# Patient Record
Sex: Female | Born: 1950
Health system: Southern US, Community
[De-identification: ages and names within clinical notes are randomized; demographics above are authoritative.]

## PROBLEM LIST (undated history)

## (undated) DIAGNOSIS — Z9889 Other specified postprocedural states: Secondary | ICD-10-CM

## (undated) DIAGNOSIS — M1990 Unspecified osteoarthritis, unspecified site: Secondary | ICD-10-CM

## (undated) DIAGNOSIS — G5600 Carpal tunnel syndrome, unspecified upper limb: Secondary | ICD-10-CM

## (undated) DIAGNOSIS — C439 Malignant melanoma of skin, unspecified: Secondary | ICD-10-CM

## (undated) DIAGNOSIS — J449 Chronic obstructive pulmonary disease, unspecified: Secondary | ICD-10-CM

## (undated) DIAGNOSIS — J189 Pneumonia, unspecified organism: Secondary | ICD-10-CM

## (undated) DIAGNOSIS — N1330 Unspecified hydronephrosis: Secondary | ICD-10-CM

## (undated) DIAGNOSIS — R0600 Dyspnea, unspecified: Secondary | ICD-10-CM

## (undated) DIAGNOSIS — R112 Nausea with vomiting, unspecified: Secondary | ICD-10-CM

## (undated) HISTORY — PX: SKIN CANCER EXCISION: SHX779

## (undated) HISTORY — PX: ABDOMINAL HYSTERECTOMY: SHX81

---
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
## 2001-05-30 ENCOUNTER — Encounter: Payer: Self-pay | Admitting: Family Medicine

## 2001-05-30 ENCOUNTER — Ambulatory Visit (HOSPITAL_COMMUNITY): Admission: RE | Admit: 2001-05-30 | Discharge: 2001-05-30 | Payer: Self-pay | Admitting: Family Medicine

## 2002-11-23 ENCOUNTER — Encounter: Payer: Self-pay | Admitting: *Deleted

## 2002-11-23 ENCOUNTER — Emergency Department (HOSPITAL_COMMUNITY): Admission: EM | Admit: 2002-11-23 | Discharge: 2002-11-24 | Payer: Self-pay | Admitting: *Deleted

## 2003-02-01 ENCOUNTER — Ambulatory Visit (HOSPITAL_COMMUNITY): Admission: RE | Admit: 2003-02-01 | Discharge: 2003-02-01 | Payer: Self-pay | Admitting: Family Medicine

## 2005-01-16 ENCOUNTER — Ambulatory Visit (HOSPITAL_COMMUNITY): Admission: RE | Admit: 2005-01-16 | Discharge: 2005-01-16 | Payer: Self-pay | Admitting: Family Medicine

## 2006-03-03 ENCOUNTER — Ambulatory Visit (HOSPITAL_COMMUNITY): Admission: RE | Admit: 2006-03-03 | Discharge: 2006-03-03 | Payer: Self-pay | Admitting: Family Medicine

## 2006-07-16 ENCOUNTER — Emergency Department (HOSPITAL_COMMUNITY): Admission: EM | Admit: 2006-07-16 | Discharge: 2006-07-16 | Payer: Self-pay | Admitting: Emergency Medicine

## 2007-03-07 ENCOUNTER — Ambulatory Visit (HOSPITAL_COMMUNITY): Admission: RE | Admit: 2007-03-07 | Discharge: 2007-03-07 | Payer: Self-pay | Admitting: Family Medicine

## 2008-01-04 ENCOUNTER — Inpatient Hospital Stay (HOSPITAL_COMMUNITY): Admission: EM | Admit: 2008-01-04 | Discharge: 2008-01-07 | Payer: Self-pay | Admitting: Emergency Medicine

## 2010-06-24 NOTE — Discharge Summary (Signed)
[name], Karen Church               ACCOUNT NO.:  [id number]   MEDICAL RECORD NO.:  [id number]          PATIENT TYPE:  INP   LOCATION:  A335                          FACILITY:  [facility]   PHYSICIAN:  Skeet Latch, DO    DATE OF BIRTH:  [birth year]   DATE OF ADMISSION:  01/04/2008  DATE OF DISCHARGE:  11/28/2009LH                               DISCHARGE SUMMARY   PRIMARY CARE PHYSICIAN:  Patient goes to the Health Department.   DISCHARGE DIAGNOSES:  1. Right lower lobe pneumonia.  2. Acute exacerbation of chronic obstructive pulmonary disease.  3. History of tobacco abuse.  4. Tachycardia.   BRIEF HOSPITAL COURSE:  This is a 60 year old Caucasian female who  presented with a complaint of shortness of breath.  The patient states  that for approximately 2 weeks she has been getting more shortness of  breath and having trouble breathing.  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  She states that she has tried to use her  nebulizer, which she had at home more often over the last 2 weeks and  also she started to develop a productive hacking cough.  Her breathing  worsened over the last few days.  She had no chills, fever, nausea, or  vomiting.  The patient states that she was told that she might have  emphysema that is why she had a nebulizer at home.  The patient  presented with above symptoms.  EKG showed sinus tachycardia, normal  axis, normal PRQ, normal QRS, normal ST interval, no Q waves.  Her chest  x-ray showed an ill-defined density present over the right lung base,  which looks like early __________ pneumonia.  D-dimer was 0.38, sodium  137, potassium 4.1, chloride 104, CO2 of 29, glucose 108, BUN 19,  creatinine 0.86, __________ unremarkable, showed a white count 14,800,  hemoglobin 15.3, hematocrit 45.6, and platelet count 269.  She was  admitted for right lower lobe pneumonia.  She was started on IV  antibiotics and nebulizing treatments with IV steroids.  The  patient was  maintained on oxygen to keep her sats above or greater than 92%.  The  patient was maintained on IV fluid.  The patient was also placed on  sliding scale for her blood sugars.  The patient was also placed on  Nicotine patch secondary to her tobacco abuse.  I think her breathing  has markedly improved during the hospital stay.  The patient also  complains of some right hip pain the x-rays showed no definite  explanation identified for the patient's right hip pain.  No evidence of  acute bony abnormalities.  Showed very mild degenerative changes in both  hip, which are symmetric.  At this time, I feel the patient is stable  enough to be discharged home for a close follow with the health  department.   MEDICATIONS ON DISCHARGE:  1. The patient continue her nebulizing treatment that she takes at      home.  I believe this is albuterol.  The patient will be placed on  Advair 250/50 mcg one inhalation twice a day.  2. Mucinex 600 mg twice a day.  3. Tessalon Perles 100 mg 3 times a day as needed.  4. Levaquin 750 mg p.o. daily for 7 days.  5. Prednisone 30 mg for 2 days, 15 mg for 2 days, 7.5 mg for days, and      5 mg for 2 days then stop.   VITALS ON DISCHARGE:  Temperature 97.6, pulse 83, respiration 20, blood  pressure 143/74, and satting 91% on room air.   LABORATORY DATA:  White count 11.8, hemoglobin 11.9, hematocrit 35.0,  and platelet count 200,000.   CONDITION ON DISCHARGE:  Stable.   DISPOSITION:  The patient will be discharged to home.   DISCHARGE INSTRUCTIONS:  The patient to maintain a regular diet.  She is  to increase her activity as tolerated.  The patient is to follow up with  the health department in the next 3-5 days.  The patient will take her  medications as directed.  I had long talk with the patient regarding her  emphysema and the need to stop smoking.  The patient was transferred to  the emergency room.  She has an increasing shortness of  breath or any  similar symptoms.      Skeet Latch, DO  Electronically Signed     SM/MEDQ  D:  01/07/2008  T:  01/08/2008  Job:  [id number]

## 2010-06-24 NOTE — H&P (Signed)
Karen Church, Karen Church               ACCOUNT NO.:  [id number]   MEDICAL RECORD NO.:  [id number]          PATIENT TYPE:  INP   LOCATION:  A335                          FACILITY:  [facility]   PHYSICIAN:  Dorris Singh, DO    DATE OF BIRTH:  09/13/1950   DATE OF ADMISSION:  01/04/2008  DATE OF DISCHARGE:  LH                              HISTORY & PHYSICAL   The patient is a 60 year old Caucasian female who presented to the [facility] emergency room with the chief complaint of shortness of breath.  The patient recounts a history of 2 weeks ago working.  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  Started using her nebulizer.  Over the last couple of weeks, she noticed  that she developed a productive and hacking cough, and her breathing has  worsened over the last couple days.  She denies any fever, chills,  nausea and vomiting.  She does have a history of COPD for which she does  use a nebulizer.  Her room air saturations upon admission to the  emergency room are 93%.  When she was placed on 2 liters, it came up to  100.  She said that it was aggravated by exertion.  It was relieved by  nothing.  She is a patient at the [facility].   Her past medical history is significant for COPD.   SOCIAL HISTORY:  She is married, and she smokes at least a pack a day.  Currently is a Visual merchandiser.   ALLERGIES:  No known drug allergies.   MEDICATIONS:  She does use medications for the nebulizers but does not  know which one.   REVIEW OF SYSTEMS:  CONSTITUTIONALLY:  Negative for fever and chills.  EYES:  Negative for changes in vision.  EARS, NOSE, MOUTH AND THROAT:  All negative.  CARDIOVASCULAR:  Positive for shortness of breath on  exertion and at rest and tachycardia.  RESPIRATORY:  Positive for cough  and dyspnea.  GASTROINTESTINAL:  Negative.  GENITOURINARY:  Negative.  MUSCULOSKELETAL:  Negative.  SKIN:  Negative.  NEUROLOGICAL:   Negative.  PSYCHIATRIC:  Negative.  METABOLIC:  Negative.  HEMATOLOGIC:  Negative.   PHYSICAL EXAMINATION:  CURRENT VITALS:  Are as follows:  Temperature  98.8, pulse 87, respirations 20, blood pressure 133/75.  GENERAL:  The patient is a well-developed, well-nourished, Caucasian  female who looked older than her stated age.  HEAD:  Is normocephalic, atraumatic.  Eyes are PERRL.  EOMI.  No scleral  icterus or conjunctival injection.  Ears, nose, mouth are within normal  limits.  Teeth are in poor repair.  NECK:  Is supple, nontender, no lymphadenopathy.  CARDIOVASCULAR:  Currently is regular rate and rhythm.  No murmur, rubs  or gallops noted.  RESPIRATORY:  Positive wheezing on inspiration and expiration and  prolonged expirations  ABDOMEN:  Soft, nontender, nondistended.  Bowel sounds in all 4  quadrants.  No organomegaly noted.  EXTREMITIES:  Positive pulses.  No ecchymosis, edema or cyanosis.  NEUROLOGICAL:  Cranial nerves II-XII grossly  intact.  Good sensation.  A/O x3 and appropriate.  SKIN:  Good turgor, good texture but is dry.   Her EKG that was done showed sinus tachycardia with normal axis and  normal PQRS and ST interval.  Her chest x-ray with the 1 view shows ill-  defined density projecting over right lung base, potentially looks like  an early bronchopneumonia, otherwise negative.  Her BNP was less than  30.  Her D-dimer was 0.38.  Sodium was 139, potassium 4.1, chloride 104,  carbon dioxide 29, glucose 108, BUN 90, creatinine 0.86.  First set of  cardiac markers were negative.  CBC:  White count of 14.8, hemoglobin  15.3, hematocrit 45.6, platelet count of 269.   ASSESSMENT AND PLAN:  1. Right lower lobe pneumonia.  2. Chronic obstructive pulmonary disease.  3. Tobacco abuse.  4. Tachycardia.   Plan will be to admit the patient to the service of [facility] on  telemetry.  Will start using any type of respiratory therapy to keep her  saturations above 92%.  Will  give her a liter bolus to see if her  tachycardia will resolve, and it has since that time.  Also will get a  repeat chest x-ray on November 27 to see if she is resolving from that.  Will do DVT and GI prophylaxis.  Will place her on Solu-Medrol as well  as giving her an initial dose in combination with the emergency room of  125.  Will also monitor her blood sugars and have sliding scale protocol  for her hypokalemia.  Will put her on Rocephin 1 gram every 24 hours and  Zithromax 500 IV every 24.  Will give her a Nicoderm patch as well.  Will continue to monitor her and change therapy as necessary.      Dorris Singh, DO  Electronically Signed     CB/MEDQ  D:  01/05/2008  T:  01/05/2008  Job:  [id number]

## 2010-11-11 LAB — BASIC METABOLIC PANEL
CO2: 26
CO2: 26
Calcium: 9
Calcium: 9
Calcium: 9.9
Chloride: 113 — ABNORMAL HIGH
Creatinine, Ser: 0.79
Creatinine, Ser: 0.86
GFR calc Af Amer: >60
GFR calc Af Amer: >60
GFR calc Af Amer: >60
GFR calc non Af Amer: >60
GFR calc non Af Amer: >60
Glucose, Bld: 164 — ABNORMAL HIGH
Potassium: 4.5
Sodium: 139
Sodium: 143

## 2010-11-11 LAB — DIFFERENTIAL
Basophils Absolute: 0
Basophils Relative: 0
Basophils Relative: 0
Basophils Relative: 0
Eosinophils Relative: 0
Lymphocytes Relative: 11 — ABNORMAL LOW
Lymphocytes Relative: 5 — ABNORMAL LOW
Lymphs Abs: 1.1
Monocytes Absolute: 0.3
Monocytes Absolute: 0.6
Monocytes Relative: 2 — ABNORMAL LOW
Monocytes Relative: 2 — ABNORMAL LOW
Monocytes Relative: 4
Neutro Abs: 10.4 — ABNORMAL HIGH
Neutro Abs: 12.2 — ABNORMAL HIGH
Neutro Abs: 12.3 — ABNORMAL HIGH
Neutro Abs: 14.4 — ABNORMAL HIGH
Neutrophils Relative %: 84 — ABNORMAL HIGH
Neutrophils Relative %: 88 — ABNORMAL HIGH
Neutrophils Relative %: 95 — ABNORMAL HIGH

## 2010-11-11 LAB — POCT CARDIAC MARKERS
Myoglobin, poc: 125
Troponin i, poc: <0.05

## 2010-11-11 LAB — CBC
HCT: 36.4
Hemoglobin: 12.2
Hemoglobin: 15.3 — ABNORMAL HIGH
MCHC: 33.6
MCHC: 33.6
MCHC: 34.5
RBC: 3.68 — ABNORMAL LOW
RBC: 3.84 — ABNORMAL LOW
RBC: 4.84
RDW: 13.1
WBC: 11.8 — ABNORMAL HIGH
WBC: 14.8 — ABNORMAL HIGH

## 2010-11-11 LAB — GLUCOSE, CAPILLARY
Glucose-Capillary: 104 — ABNORMAL HIGH
Glucose-Capillary: 125 — ABNORMAL HIGH
Glucose-Capillary: 158 — ABNORMAL HIGH
Glucose-Capillary: 166 — ABNORMAL HIGH
Glucose-Capillary: 168 — ABNORMAL HIGH
Glucose-Capillary: 207 — ABNORMAL HIGH

## 2010-11-11 LAB — CULTURE, BLOOD (ROUTINE X 2)
Culture: NO GROWTH
Report Status: 11302009
Report Status: 11302009

## 2010-11-11 LAB — APTT: aPTT: 41 — ABNORMAL HIGH

## 2010-11-11 LAB — D-DIMER, QUANTITATIVE: D-Dimer, Quant: 0.38

## 2010-11-11 LAB — PROTIME-INR
INR: 1.1
Prothrombin Time: 14.4

## 2011-11-05 ENCOUNTER — Other Ambulatory Visit (HOSPITAL_COMMUNITY): Payer: Self-pay | Admitting: Nurse Practitioner

## 2011-11-05 DIAGNOSIS — Z139 Encounter for screening, unspecified: Secondary | ICD-10-CM

## 2011-11-10 ENCOUNTER — Ambulatory Visit (HOSPITAL_COMMUNITY)
Admission: RE | Admit: 2011-11-10 | Discharge: 2011-11-10 | Disposition: A | Discharge status: Home / Self Care | Payer: PRIVATE HEALTH INSURANCE | Source: Ambulatory Visit | Attending: Nurse Practitioner | Admitting: Nurse Practitioner

## 2011-11-10 DIAGNOSIS — Z1231 Encounter for screening mammogram for malignant neoplasm of breast: Secondary | ICD-10-CM | POA: Insufficient documentation

## 2011-11-10 DIAGNOSIS — Z139 Encounter for screening, unspecified: Secondary | ICD-10-CM

## 2011-11-14 ENCOUNTER — Emergency Department (HOSPITAL_COMMUNITY): Payer: No Typology Code available for payment source

## 2011-11-14 ENCOUNTER — Encounter (HOSPITAL_COMMUNITY): Payer: Self-pay

## 2011-11-14 ENCOUNTER — Emergency Department (HOSPITAL_COMMUNITY)
Admission: EM | Admit: 2011-11-14 | Discharge: 2011-11-14 | Disposition: A | Discharge status: Home / Self Care | Payer: No Typology Code available for payment source | Attending: Emergency Medicine | Admitting: Emergency Medicine

## 2011-11-14 DIAGNOSIS — R079 Chest pain, unspecified: Secondary | ICD-10-CM | POA: Insufficient documentation

## 2011-11-14 DIAGNOSIS — M542 Cervicalgia: Secondary | ICD-10-CM | POA: Insufficient documentation

## 2011-11-14 DIAGNOSIS — S8011XA Contusion of right lower leg, initial encounter: Secondary | ICD-10-CM

## 2011-11-14 DIAGNOSIS — T1490XA Injury, unspecified, initial encounter: Secondary | ICD-10-CM | POA: Insufficient documentation

## 2011-11-14 DIAGNOSIS — R51 Headache: Secondary | ICD-10-CM | POA: Insufficient documentation

## 2011-11-14 DIAGNOSIS — M549 Dorsalgia, unspecified: Secondary | ICD-10-CM

## 2011-11-14 DIAGNOSIS — M79609 Pain in unspecified limb: Secondary | ICD-10-CM | POA: Insufficient documentation

## 2011-11-14 DIAGNOSIS — S8012XA Contusion of left lower leg, initial encounter: Secondary | ICD-10-CM

## 2011-11-14 DIAGNOSIS — S8010XA Contusion of unspecified lower leg, initial encounter: Secondary | ICD-10-CM | POA: Insufficient documentation

## 2011-11-14 HISTORY — DX: Carpal tunnel syndrome, unspecified upper limb: G56.00

## 2011-11-14 HISTORY — DX: Chronic obstructive pulmonary disease, unspecified: J44.9

## 2011-11-14 LAB — BASIC METABOLIC PANEL
BUN: 12 mg/dL (ref 6–23)
Chloride: 104 mEq/L (ref 96–112)
GFR calc Af Amer: 83 mL/min — ABNORMAL LOW (ref >90)
Potassium: 3.9 mEq/L (ref 3.5–5.1)

## 2011-11-14 LAB — CBC WITH DIFFERENTIAL/PLATELET
Basophils Relative: 0 % (ref 0–1)
Hemoglobin: 14.5 g/dL (ref 12.0–15.0)
Lymphs Abs: 1.8 10*3/uL (ref 0.7–4.0)
MCHC: 34.9 g/dL (ref 30.0–36.0)
Monocytes Relative: 4 % (ref 3–12)
Neutro Abs: 7 10*3/uL (ref 1.7–7.7)
Neutrophils Relative %: 75 % (ref 43–77)
RBC: 4.41 MIL/uL (ref 3.87–5.11)
WBC: 9.3 10*3/uL (ref 4.0–10.5)

## 2011-11-14 MED ORDER — IOHEXOL 300 MG/ML  SOLN
80.0000 mL | Freq: Once | INTRAMUSCULAR | Status: AC | PRN
  Administered 2011-11-14: 80 mL via INTRAVENOUS

## 2011-11-14 MED ORDER — HYDROCODONE-ACETAMINOPHEN 5-325 MG PO TABS: 1.0000 | ORAL_TABLET | Freq: Four times a day (QID) | ORAL | Status: DC | PRN
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
## 2011-11-14 NOTE — ED Notes (Signed)
Called to patient room by family member. Patient with continued pain to bilateral scapular area. Patient stating she does not want any pain medications at this time. Patient informed to contact RN if she needs anything.

## 2011-11-14 NOTE — ED Notes (Signed)
Patient with no complaints at this time. Respirations even and unlabored. Skin warm/dry. Discharge instructions reviewed with patient at this time. Patient given opportunity to voice concerns/ask questions. IV removed per policy and band-aid applied to site. Patient discharged at this time and left Emergency Department via wheelchair.  
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
## 2011-11-14 NOTE — ED Notes (Signed)
Patient immobilized by EMS in sitting position. C-collar in place. Patient alert/oriented to baseline x4.

## 2012-11-29 ENCOUNTER — Other Ambulatory Visit (HOSPITAL_COMMUNITY): Payer: Self-pay | Admitting: *Deleted

## 2012-11-29 DIAGNOSIS — Z139 Encounter for screening, unspecified: Secondary | ICD-10-CM

## 2012-12-08 ENCOUNTER — Ambulatory Visit (HOSPITAL_COMMUNITY)
Admission: RE | Admit: 2012-12-08 | Discharge: 2012-12-08 | Disposition: A | Discharge status: Home / Self Care | Payer: PRIVATE HEALTH INSURANCE | Source: Ambulatory Visit | Attending: *Deleted | Admitting: *Deleted

## 2012-12-08 DIAGNOSIS — Z1231 Encounter for screening mammogram for malignant neoplasm of breast: Secondary | ICD-10-CM | POA: Insufficient documentation

## 2012-12-08 DIAGNOSIS — Z139 Encounter for screening, unspecified: Secondary | ICD-10-CM

## 2014-10-30 ENCOUNTER — Encounter (HOSPITAL_COMMUNITY): Payer: Self-pay

## 2014-10-30 ENCOUNTER — Emergency Department (HOSPITAL_COMMUNITY)
Admission: EM | Admit: 2014-10-30 | Discharge: 2014-10-30 | Disposition: A | Discharge status: Home / Self Care | Payer: Medicare Other | Attending: Emergency Medicine | Admitting: Emergency Medicine

## 2014-10-30 ENCOUNTER — Emergency Department (HOSPITAL_COMMUNITY): Payer: Medicare Other

## 2014-10-30 DIAGNOSIS — J189 Pneumonia, unspecified organism: Secondary | ICD-10-CM

## 2014-10-30 DIAGNOSIS — R0602 Shortness of breath: Secondary | ICD-10-CM | POA: Diagnosis present

## 2014-10-30 DIAGNOSIS — Z720 Tobacco use: Secondary | ICD-10-CM | POA: Diagnosis not present

## 2014-10-30 DIAGNOSIS — J9801 Acute bronchospasm: Secondary | ICD-10-CM | POA: Diagnosis not present

## 2014-10-30 DIAGNOSIS — Z7982 Long term (current) use of aspirin: Secondary | ICD-10-CM | POA: Insufficient documentation

## 2014-10-30 DIAGNOSIS — Z8669 Personal history of other diseases of the nervous system and sense organs: Secondary | ICD-10-CM | POA: Diagnosis not present

## 2014-10-30 DIAGNOSIS — J159 Unspecified bacterial pneumonia: Secondary | ICD-10-CM | POA: Insufficient documentation

## 2014-10-30 DIAGNOSIS — Z79899 Other long term (current) drug therapy: Secondary | ICD-10-CM | POA: Diagnosis not present

## 2014-10-30 DIAGNOSIS — J441 Chronic obstructive pulmonary disease with (acute) exacerbation: Secondary | ICD-10-CM | POA: Diagnosis not present

## 2014-10-30 LAB — CBC WITH DIFFERENTIAL/PLATELET
Basophils Absolute: 0 10*3/uL (ref 0.0–0.1)
Basophils Relative: 0 %
EOS ABS: 0.2 10*3/uL (ref 0.0–0.7)
EOS PCT: 2 %
HCT: 40.9 % (ref 36.0–46.0)
Hemoglobin: 13.8 g/dL (ref 12.0–15.0)
LYMPHS ABS: 1.2 10*3/uL (ref 0.7–4.0)
LYMPHS PCT: 12 %
MCH: 32.9 pg (ref 26.0–34.0)
MCHC: 33.7 g/dL (ref 30.0–36.0)
MCV: 97.4 fL (ref 78.0–100.0)
MONO ABS: 0.7 10*3/uL (ref 0.1–1.0)
Monocytes Relative: 7 %
Neutro Abs: 8.4 10*3/uL — ABNORMAL HIGH (ref 1.7–7.7)
Neutrophils Relative %: 79 %
PLATELETS: 208 10*3/uL (ref 150–400)
RBC: 4.2 MIL/uL (ref 3.87–5.11)
RDW: 12.6 % (ref 11.5–15.5)
WBC: 10.6 10*3/uL — AB (ref 4.0–10.5)

## 2014-10-30 LAB — BASIC METABOLIC PANEL
Anion gap: 7 (ref 5–15)
BUN: 14 mg/dL (ref 6–20)
CHLORIDE: 99 mmol/L — AB (ref 101–111)
CO2: 35 mmol/L — AB (ref 22–32)
Calcium: 9.4 mg/dL (ref 8.9–10.3)
Creatinine, Ser: 0.87 mg/dL (ref 0.44–1.00)
GFR calc Af Amer: >60 mL/min (ref >60)
GLUCOSE: 112 mg/dL — AB (ref 65–99)
POTASSIUM: 3.8 mmol/L (ref 3.5–5.1)
Sodium: 141 mmol/L (ref 135–145)

## 2014-10-30 MED ORDER — ALBUTEROL SULFATE (2.5 MG/3ML) 0.083% IN NEBU
10.0000 mg | INHALATION_SOLUTION | Freq: Once | RESPIRATORY_TRACT | Status: AC
  Administered 2014-10-30: 10 mg via RESPIRATORY_TRACT
  Filled 2014-10-30: qty 12

## 2014-10-30 MED ORDER — LEVOFLOXACIN 750 MG PO TABS: 750.0000 mg | ORAL_TABLET | Freq: Every day | ORAL | Status: DC

## 2014-10-30 MED ORDER — LEVOFLOXACIN 750 MG PO TABS
750.0000 mg | ORAL_TABLET | Freq: Once | ORAL | Status: AC
  Administered 2014-10-30: 750 mg via ORAL
  Filled 2014-10-30: qty 1

## 2014-10-30 MED ORDER — PREDNISONE 50 MG PO TABS
60.0000 mg | ORAL_TABLET | Freq: Once | ORAL | Status: AC
  Administered 2014-10-30: 60 mg via ORAL
  Filled 2014-10-30 (×2): qty 1

## 2014-10-30 MED ORDER — PREDNISONE 20 MG PO TABS: 20.0000 mg | ORAL_TABLET | Freq: Two times a day (BID) | ORAL | Status: DC

## 2014-10-30 NOTE — ED Provider Notes (Signed)
CSN: [id number]     Arrival date & time 10/30/14  1919 History  This chart was scribed for Mancel Bale, MD by Evon Slack, ED Scribe. This patient was seen in room APA15/APA15 and the patient's care was started at 8:05 PM.    Chief Complaint  Patient presents with  . Shortness of Breath   Patient is a 64 y.o. female presenting with shortness of breath. The history is provided by the patient. No language interpreter was used.  Shortness of Breath Associated symptoms: cough   Associated symptoms: no fever    HPI Comments: Karen Church is a 64 y.o. female who presents to the Emergency Department complaining of progressively worsening SOB onset 5 days prior. Pt states that her symptoms started with congestion. Pt reports productive cough or dark colored sputum. Pt states that she has been using her at home nebulizer with no relief. Pt states that her SOB is worse when laying down. Pt denies fever or other related symptoms.   Pt still reports tobacco use daily  There are no other known modifying factors.   Past Medical History  Diagnosis Date  . COPD (chronic obstructive pulmonary disease)   . Carpal tunnel syndrome    Past Surgical History  Procedure Laterality Date  . Abdominal surgery    . Cesarean section    . Skin cancer excision    . Abdominal hysterectomy     No family history on file. Social History  Substance Use Topics  . Smoking status: Current Every Day Smoker -- 0.50 packs/day    Types: Cigarettes  . Smokeless tobacco: None  . Alcohol Use: No   OB History    No data available     Review of Systems  Constitutional: Negative for fever.  HENT: Positive for congestion.   Respiratory: Positive for cough and shortness of breath.   All other systems reviewed and are negative.    Allergies  Review of patient's allergies indicates no known allergies.  Home Medications   Prior to Admission medications   Medication Sig Start Date End Date Taking?  Authorizing Provider  aspirin EC 81 MG tablet Take 81 mg by mouth every other day.    Historical Provider, MD  fish oil-omega-3 fatty acids 1000 MG capsule Take 1 g by mouth daily.    Historical Provider, MD  levofloxacin (LEVAQUIN) 750 MG tablet Take 1 tablet (750 mg total) by mouth daily. 10/30/14   Mancel Bale, MD  Multiple Vitamin (MULTIVITAMIN WITH MINERALS) TABS Take 1 tablet by mouth daily.    Historical Provider, MD  predniSONE (DELTASONE) 20 MG tablet Take 1 tablet (20 mg total) by mouth 2 (two) times daily. 10/30/14   Mancel Bale, MD   BP 134/57 mmHg  Pulse 70  Temp(Src) 99.3 F (37.4 C) (Oral)  Resp 16  Ht  (1.6 m)  Wt 120 lb (54.432 kg)  BMI 21.26 kg/m2  SpO2 93%   Physical Exam  Constitutional: She is oriented to person, place, and time. She appears well-developed and well-nourished.  HENT:  Head: Normocephalic and atraumatic.  Right Ear: External ear normal.  Left Ear: External ear normal.  Eyes: Conjunctivae and EOM are normal. Pupils are equal, round, and reactive to light.  Neck: Normal range of motion and phonation normal. Neck supple.  Cardiovascular: Normal rate, regular rhythm and normal heart sounds.   Pulmonary/Chest: Effort normal. She has decreased breath sounds. She has wheezes. She exhibits no bony tenderness.  Bilateral decreased air movement  with generalized wheezing  Abdominal: Soft. There is no tenderness.  Musculoskeletal: Normal range of motion.  Neurological: She is alert and oriented to person, place, and time. No cranial nerve deficit or sensory deficit. She exhibits normal muscle tone. Coordination normal.  Skin: Skin is warm, dry and intact.  Psychiatric: She has a normal mood and affect. Her behavior is normal. Judgment and thought content normal.  Nursing note and vitals reviewed.   ED Course  Procedures (including critical care time)   Medications  albuterol (PROVENTIL) (2.5 MG/3ML) 0.083% nebulizer solution 10 mg (10 mg  Nebulization Given 10/30/14 2048)  predniSONE (DELTASONE) tablet 60 mg (60 mg Oral Given 10/30/14 2026)  levofloxacin (LEVAQUIN) tablet 750 mg (750 mg Oral Given 10/30/14 2026)    Patient Vitals for the past 24 hrs:  BP Temp Temp src Pulse Resp SpO2 Height Weight  10/30/14 2234 134/57 mmHg 99.3 F (37.4 C) Oral 70 16 93 % - -  10/30/14 2055 - - - - - 91 % - -  10/30/14 1931 (!) 135/109 mmHg 99 F (37.2 C) Oral 68 16 91 %  (1.6 m) 120 lb (54.432 kg)    At discharge-  Reevaluation with update and discussion. After initial assessment and treatment, an updated evaluation reveals patient is comfortable and has improved air movement. Findings discussed with patient and family member, all questions were answered. WENTZ,ELLIOTT L     DIAGNOSTIC STUDIES: Oxygen Saturation is 91% on RA, low by my interpretation.    COORDINATION OF CARE: 8:15 PM-Discussed treatment plan with pt at bedside and pt agreed to plan.     Labs Review Labs Reviewed  BASIC METABOLIC PANEL - Abnormal; Notable for the following:    Chloride 99 (*)    CO2 35 (*)    Glucose, Bld 112 (*)    All other components within normal limits  CBC WITH DIFFERENTIAL/PLATELET - Abnormal; Notable for the following:    WBC 10.6 (*)    Neutro Abs 8.4 (*)    All other components within normal limits    Imaging Review Dg Chest 2 View  10/30/2014   CLINICAL DATA:  Pt c/o nonproductive cough since Thursday. When coughing becomes constant Pt experiences severe SOB. Hx COPD, smoker  EXAM: CHEST - 2 VIEW  COMPARISON:  CT 11/14/2011  FINDINGS: Patchy somewhat nodular lingular infiltrate, obscuring the left heart border, new since prior studies. Lungs are otherwise mildly hyperinflated. Heart size remains normal. No pneumothorax. No effusion. Minimal spurring in the lower thoracic spine.  IMPRESSION: 1. Patchy lingular infiltrate suggesting pneumonia. Consider follow-up to confirm appropriate resolution and exclude underlying lesion.    Electronically Signed   By: Corlis Leak M.D.   On: 10/30/2014 21:04      EKG Interpretation None      MDM   Final diagnoses:  CAP (community acquired pneumonia)  Bronchospasm  Tobacco abuse    Community acquired pneumonia without signs and symptoms of sepsis, and associated bronchospasm. Patient improved with treatment and is stable for discharge with outpatient management.  Nursing Notes Reviewed/ Care Coordinated Applicable Imaging Reviewed Interpretation of Laboratory Data incorporated into ED treatment  The patient appears reasonably screened and/or stabilized for discharge and I doubt any other medical condition or other [facility] requiring further screening, evaluation, or treatment in the ED at this time prior to discharge.  Plan: Home Medications- Prednisone, Levaquin; Home Treatments- Stop smoking; return here if the recommended treatment, does not improve the symptoms; Recommended follow up- PCP  1 week  I personally performed the services described in this documentation, which was scribed in my presence. The recorded information has been reviewed and is accurate.      Mancel Bale, MD 10/30/14 [phone]

## 2014-10-30 NOTE — ED Notes (Signed)
Started last week with congestion and tried to get into the health department and they could not see me due to my insurance. I tried to fight it off and now I have became worse. I feel like when I lay down I am smothering per pt.

## 2014-10-30 NOTE — Discharge Instructions (Signed)
Bronchospasm A bronchospasm is a spasm or tightening of the airways going into the lungs. During a bronchospasm breathing becomes more difficult because the airways get smaller. When this happens there can be coughing, a whistling sound when breathing (wheezing), and difficulty breathing. Bronchospasm is often associated with asthma, but not all patients who experience a bronchospasm have asthma. CAUSES  A bronchospasm is caused by inflammation or irritation of the airways. The inflammation or irritation may be triggered by:   Allergies (such as to animals, pollen, food, or mold). Allergens that cause bronchospasm may cause wheezing immediately after exposure or many hours later.   Infection. Viral infections are believed to be the most common cause of bronchospasm.   Exercise.   Irritants (such as pollution, cigarette smoke, strong odors, aerosol sprays, and paint fumes).   Weather changes. Winds increase molds and pollens in the air. Rain refreshes the air by washing irritants out. Cold air may cause inflammation.   Stress and emotional upset.  SIGNS AND SYMPTOMS   Wheezing.   Excessive nighttime coughing.   Frequent or severe coughing with a simple cold.   Chest tightness.   Shortness of breath.  DIAGNOSIS  Bronchospasm is usually diagnosed through a history and physical exam. Tests, such as chest X-rays, are sometimes done to look for other conditions. TREATMENT   Inhaled medicines can be given to open up your airways and help you breathe. The medicines can be given using either an inhaler or a nebulizer machine.  Corticosteroid medicines may be given for severe bronchospasm, usually when it is associated with asthma. HOME CARE INSTRUCTIONS   Always have a plan prepared for seeking medical care. Know when to call your health care provider and local emergency services (911 in the [place] Know where you can access local emergency care.  Only take medicines as  directed by your health care provider.  If you were prescribed an inhaler or nebulizer machine, ask your health care provider to explain how to use it correctly. Always use a spacer with your inhaler if you were given one.  It is necessary to remain calm during an attack. Try to relax and breathe more slowly.  Control your home environment in the following ways:   Change your heating and air conditioning filter at least once a month.   Limit your use of fireplaces and wood stoves.  Do not smoke and do not allow smoking in your home.   Avoid exposure to perfumes and fragrances.   Get rid of pests (such as roaches and mice) and their droppings.   Throw away plants if you see mold on them.   Keep your house clean and dust free.   Replace carpet with wood, tile, or vinyl flooring. Carpet can trap dander and dust.   Use allergy-proof pillows, mattress covers, and box spring covers.   Wash bed sheets and blankets every week in hot water and dry them in a dryer.   Use blankets that are made of polyester or cotton.   Wash hands frequently. SEEK MEDICAL CARE IF:   You have muscle aches.   You have chest pain.   The sputum changes from clear or white to yellow, green, gray, or bloody.   The sputum you cough up gets thicker.   There are problems that may be related to the medicine you are given, such as a rash, itching, swelling, or trouble breathing.  SEEK IMMEDIATE MEDICAL CARE IF:   You have worsening wheezing and coughing even  after taking your prescribed medicines.   You have increased difficulty breathing.   You develop severe chest pain. MAKE SURE YOU:   Understand these instructions.  Will watch your condition.  Will get help right away if you are not doing well or get worse. Document Released: 01/29/2003 Document Revised: 01/31/2013 Document Reviewed: 07/18/2012 [facility] Patient Information 2015 [place], [place]. This information is not  intended to replace advice given to you by your health care provider. Make sure you discuss any questions you have with your health care provider.  Pneumonia Pneumonia is an infection of the lungs.  CAUSES Pneumonia may be caused by bacteria or a virus. Usually, these infections are caused by breathing infectious particles into the lungs (respiratory tract). SIGNS AND SYMPTOMS   Cough.  Fever.  Chest pain.  Increased rate of breathing.  Wheezing.  Mucus production. DIAGNOSIS  If you have the common symptoms of pneumonia, your health care provider will typically confirm the diagnosis with a chest X-ray. The X-ray will show an abnormality in the lung (pulmonary infiltrate) if you have pneumonia. Other tests of your blood, urine, or sputum may be done to find the specific cause of your pneumonia. Your health care provider may also do tests (blood gases or pulse oximetry) to see how well your lungs are working. TREATMENT  Some forms of pneumonia may be spread to other people when you cough or sneeze. You may be asked to wear a mask before and during your exam. Pneumonia that is caused by bacteria is treated with antibiotic medicine. Pneumonia that is caused by the influenza virus may be treated with an antiviral medicine. Most other viral infections must run their course. These infections will not respond to antibiotics.  HOME CARE INSTRUCTIONS   Cough suppressants may be used if you are losing too much rest. However, coughing protects you by clearing your lungs. You should avoid using cough suppressants if you can.  Your health care provider may have prescribed medicine if he or she thinks your pneumonia is caused by bacteria or influenza. Finish your medicine even if you start to feel better.  Your health care provider may also prescribe an expectorant. This loosens the mucus to be coughed up.  Take medicines only as directed by your health care provider.  Do not smoke. Smoking is a  common cause of bronchitis and can contribute to pneumonia. If you are a smoker and continue to smoke, your cough may last several weeks after your pneumonia has cleared.  A cold steam vaporizer or humidifier in your room or home may help loosen mucus.  Coughing is often worse at night. Sleeping in a semi-upright position in a recliner or using a couple pillows under your head will help with this.  Get rest as you feel it is needed. Your body will usually let you know when you need to rest. PREVENTION A pneumococcal shot (vaccine) is available to prevent a common bacterial cause of pneumonia. This is usually suggested for:  People over 68 years old.  Patients on chemotherapy.  People with chronic lung problems, such as bronchitis or emphysema.  People with immune system problems. If you are over 65 or have a high risk condition, you may receive the pneumococcal vaccine if you have not received it before. In some countries, a routine influenza vaccine is also recommended. This vaccine can help prevent some cases of pneumonia.You may be offered the influenza vaccine as part of your care. If you smoke, it is time  to quit. You may receive instructions on how to stop smoking. Your health care provider can provide medicines and counseling to help you quit. SEEK MEDICAL CARE IF: You have a fever. SEEK IMMEDIATE MEDICAL CARE IF:   Your illness becomes worse. This is especially true if you are elderly or weakened from any other disease.  You cannot control your cough with suppressants and are losing sleep.  You begin coughing up blood.  You develop pain which is getting worse or is uncontrolled with medicines.  Any of the symptoms which initially brought you in for treatment are getting worse rather than better.  You develop shortness of breath or chest pain. MAKE SURE YOU:   Understand these instructions.  Will watch your condition.  Will get help right away if you are not doing well  or get worse. Document Released: 01/26/2005 Document Revised: 06/12/2013 Document Reviewed: 04/17/2010 [facility] Patient Information 2015 [place], [place]. This information is not intended to replace advice given to you by your health care provider. Make sure you discuss any questions you have with your health care provider.  Smoking Cessation Quitting smoking is important to your health and has many advantages. However, it is not always easy to quit since nicotine is a very addictive drug. Oftentimes, people try 3 times or more before being able to quit. This document explains the best ways for you to prepare to quit smoking. Quitting takes hard work and a lot of effort, but you can do it. ADVANTAGES OF QUITTING SMOKING  You will live longer, feel better, and live better.  Your body will feel the impact of quitting smoking almost immediately.  Within 20 minutes, blood pressure decreases. Your pulse returns to its normal level.  After 8 hours, carbon monoxide levels in the blood return to normal. Your oxygen level increases.  After 24 hours, the chance of having a heart attack starts to decrease. Your breath, hair, and body stop smelling like smoke.  After 48 hours, damaged nerve endings begin to recover. Your sense of taste and smell improve.  After 72 hours, the body is virtually free of nicotine. Your bronchial tubes relax and breathing becomes easier.  After 2 to 12 weeks, lungs can hold more air. Exercise becomes easier and circulation improves.  The risk of having a heart attack, stroke, cancer, or lung disease is greatly reduced.  After 1 year, the risk of coronary heart disease is cut in half.  After 5 years, the risk of stroke falls to the same as a nonsmoker.  After 10 years, the risk of lung cancer is cut in half and the risk of other cancers decreases significantly.  After 15 years, the risk of coronary heart disease drops, usually to the level of a nonsmoker.  If you are  pregnant, quitting smoking will improve your chances of having a healthy baby.  The people you live with, especially any children, will be healthier.  You will have extra money to spend on things other than cigarettes. QUESTIONS TO THINK ABOUT BEFORE ATTEMPTING TO QUIT You may want to talk about your answers with your health care provider.  Why do you want to quit?  If you tried to quit in the past, what helped and what did not?  What will be the most difficult situations for you after you quit? How will you plan to handle them?  Who can help you through the tough times? Your family? Friends? A health care provider?  What pleasures do you get  from smoking? What ways can you still get pleasure if you quit? Here are some questions to ask your health care provider:  How can you help me to be successful at quitting?  What medicine do you think would be best for me and how should I take it?  What should I do if I need more help?  What is smoking withdrawal like? How can I get information on withdrawal? GET READY  Set a quit date.  Change your environment by getting rid of all cigarettes, ashtrays, matches, and lighters in your home, car, or work. Do not let people smoke in your home.  Review your past attempts to quit. Think about what worked and what did not. GET SUPPORT AND ENCOURAGEMENT You have a better chance of being successful if you have help. You can get support in many ways.  Tell your family, friends, and coworkers that you are going to quit and need their support. Ask them not to smoke around you.  Get individual, group, or telephone counseling and support. Programs are available at [employer] and health centers. Call your local health department for information about programs in your area.  Spiritual beliefs and practices may help some smokers quit.  Download a "quit meter" on your computer to keep track of quit statistics, such as how long you have gone without  smoking, cigarettes not smoked, and money saved.  Get a self-help book about quitting smoking and staying off tobacco. LEARN NEW SKILLS AND BEHAVIORS  Distract yourself from urges to smoke. Talk to someone, go for a walk, or occupy your time with a task.  Change your normal routine. Take a different route to work. Drink tea instead of coffee. Eat breakfast in a different place.  Reduce your stress. Take a hot bath, exercise, or read a book.  Plan something enjoyable to do every day. Reward yourself for not smoking.  Explore interactive web-based programs that specialize in helping you quit. GET MEDICINE AND USE IT CORRECTLY Medicines can help you stop smoking and decrease the urge to smoke. Combining medicine with the above behavioral methods and support can greatly increase your chances of successfully quitting smoking.  Nicotine replacement therapy helps deliver nicotine to your body without the negative effects and risks of smoking. Nicotine replacement therapy includes nicotine gum, lozenges, inhalers, nasal sprays, and skin patches. Some may be available over-the-counter and others require a prescription.  Antidepressant medicine helps people abstain from smoking, but how this works is unknown. This medicine is available by prescription.  Nicotinic receptor partial agonist medicine simulates the effect of nicotine in your brain. This medicine is available by prescription. Ask your health care provider for advice about which medicines to use and how to use them based on your health history. Your health care provider will tell you what side effects to look out for if you choose to be on a medicine or therapy. Carefully read the information on the package. Do not use any other product containing nicotine while using a nicotine replacement product.  RELAPSE OR DIFFICULT SITUATIONS Most relapses occur within the first 3 months after quitting. Do not be discouraged if you start smoking again.  Remember, most people try several times before finally quitting. You may have symptoms of withdrawal because your body is used to nicotine. You may crave cigarettes, be irritable, feel very hungry, cough often, get headaches, or have difficulty concentrating. The withdrawal symptoms are only temporary. They are strongest when you first quit, but they  will go away within 10-14 days. To reduce the chances of relapse, try to:  Avoid drinking alcohol. Drinking lowers your chances of successfully quitting.  Reduce the amount of caffeine you consume. Once you quit smoking, the amount of caffeine in your body increases and can give you symptoms, such as a rapid heartbeat, sweating, and anxiety.  Avoid smokers because they can make you want to smoke.  Do not let weight gain distract you. Many smokers will gain weight when they quit, usually less than 10 pounds. Eat a healthy diet and stay active. You can always lose the weight gained after you quit.  Find ways to improve your mood other than smoking. FOR MORE INFORMATION  www.smokefree.gov  Document Released: 01/20/2001 Document Revised: 06/12/2013 Document Reviewed: 05/07/2011 [facility] Patient Information 2015 [place], [place]. This information is not intended to replace advice given to you by your health care provider. Make sure you discuss any questions you have with your health care provider.  Smoking Hazards Smoking cigarettes is extremely bad for your health. Tobacco smoke has over 200 known poisons in it. It contains the poisonous gases nitrogen oxide and carbon monoxide. There are over 60 chemicals in tobacco smoke that cause cancer. Some of the chemicals found in cigarette smoke include:   Cyanide.   Benzene.   Formaldehyde.   Methanol (wood alcohol).   Acetylene (fuel used in welding torches).   Ammonia.  Even smoking lightly shortens your life expectancy by several years. You can greatly reduce the risk of medical problems for  you and your family by stopping now. Smoking is the most preventable cause of death and disease in our society. Within days of quitting smoking, your circulation improves, you decrease the risk of having a heart attack, and your lung capacity improves. There may be some increased phlegm in the first few days after quitting, and it may take months for your lungs to clear up completely. Quitting for 10 years reduces your risk of developing lung cancer to almost that of a nonsmoker.  WHAT ARE THE RISKS OF SMOKING? Cigarette smokers have an increased risk of many serious medical problems, including:  Lung cancer.   Lung disease (such as pneumonia, bronchitis, and emphysema).   Heart attack and chest pain due to the heart not getting enough oxygen (angina).   Heart disease and peripheral blood vessel disease.   Hypertension.   Stroke.   Oral cancer (cancer of the lip, mouth, or voice box).   Bladder cancer.   Pancreatic cancer.   Cervical cancer.   Pregnancy complications, including premature birth.   Stillbirths and smaller newborn babies, birth defects, and genetic damage to sperm.   Early menopause.   Lower estrogen level for women.   Infertility.   Facial wrinkles.   Blindness.   Increased risk of broken bones (fractures).   Senile dementia.   Stomach ulcers and internal bleeding.   Delayed wound healing and increased risk of complications during surgery. Because of secondhand smoke exposure, children of smokers have an increased risk of the following:   Sudden infant death syndrome (SIDS).   Respiratory infections.   Lung cancer.   Heart disease.   Ear infections.  WHY IS SMOKING ADDICTIVE? Nicotine is the chemical agent in tobacco that is capable of causing addiction or dependence. When you smoke and inhale, nicotine is absorbed rapidly into the bloodstream through your lungs. Both inhaled and noninhaled nicotine may be addictive.    WHAT ARE THE BENEFITS OF QUITTING?  There  are many health benefits to quitting smoking. Some are:   The likelihood of developing cancer and heart disease decreases. Health improvements are seen almost immediately.   Blood pressure, pulse rate, and breathing patterns start returning to normal soon after quitting.   People who quit may see an improvement in their overall quality of life.  HOW DO YOU QUIT SMOKING? Smoking is an addiction with both physical and psychological effects, and longtime habits can be hard to change. Your health care provider can recommend:  Programs and community resources, which may include group support, education, or therapy.  Replacement products, such as patches, gum, and nasal sprays. Use these products only as directed. Do not replace cigarette smoking with electronic cigarettes (commonly called e-cigarettes). The safety of e-cigarettes is unknown, and some may contain harmful chemicals. FOR MORE INFORMATION  American Lung Association: www.lung.org  American Cancer Society: www.cancer.org Document Released: 03/05/2004 Document Revised: 11/16/2012 Document Reviewed: 07/18/2012 [facility] Patient Information 2015 [place], [place]. This information is not intended to replace advice given to you by your health care provider. Make sure you discuss any questions you have with your health care provider.

## 2015-10-23 ENCOUNTER — Other Ambulatory Visit (HOSPITAL_COMMUNITY): Payer: Self-pay | Admitting: Internal Medicine

## 2015-10-23 DIAGNOSIS — Z780 Asymptomatic menopausal state: Secondary | ICD-10-CM

## 2015-10-23 DIAGNOSIS — R0600 Dyspnea, unspecified: Secondary | ICD-10-CM

## 2015-10-25 ENCOUNTER — Encounter: Payer: Self-pay | Admitting: Internal Medicine

## 2015-11-04 ENCOUNTER — Ambulatory Visit (HOSPITAL_COMMUNITY)
Admission: RE | Admit: 2015-11-04 | Discharge: 2015-11-04 | Disposition: A | Discharge status: Home / Self Care | Payer: Medicare Other | Source: Ambulatory Visit | Attending: Internal Medicine | Admitting: Internal Medicine

## 2015-11-04 DIAGNOSIS — I5189 Other ill-defined heart diseases: Secondary | ICD-10-CM | POA: Diagnosis not present

## 2015-11-04 DIAGNOSIS — Z780 Asymptomatic menopausal state: Secondary | ICD-10-CM | POA: Insufficient documentation

## 2015-11-04 DIAGNOSIS — R0600 Dyspnea, unspecified: Secondary | ICD-10-CM | POA: Insufficient documentation

## 2015-11-04 LAB — ECHOCARDIOGRAM COMPLETE
CHL CUP MV DEC (S): 261
CHL CUP STROKE VOLUME: 34 mL
E decel time: 261 msec
E/e' ratio: 10.43
FS: 35 % (ref 28–44)
IVS/LV PW RATIO, ED: 1.02
LA ID, A-P, ES: 29 mm
LA diam index: 1.86 cm/m2
LA vol A4C: 23.6 ml
LA vol index: 15.5 mL/m2
LA vol: 24.2 mL
LDCA: 2.27 cm2
LEFT ATRIUM END SYS DIAM: 29 mm
LV E/e'average: 10.43
LV PW d: 9.44 mm — AB (ref 0.6–1.1)
LV TDI E'LATERAL: 8.05
LV e' LATERAL: 8.05 cm/s
LV sys vol: 17 mL (ref 14–42)
LVDIAVOL: 51 mL (ref 46–106)
LVDIAVOLIN: 33 mL/m2
LVEEMED: 10.43
LVOT VTI: 19.6 cm
LVOT peak grad rest: 4 mmHg
LVOT peak vel: 97.6 cm/s
LVOTD: 17 mm
LVOTSV: 44 mL
LVSYSVOLIN: 11 mL/m2
Lateral S' vel: 11.3 cm/s
MV pk A vel: 81.7 m/s
MV pk E vel: 84 m/s
MVPG: 3 mmHg
RV TAPSE: 16.8 mm
Simpson's disk: 67
TDI e' medial: 8.16

## 2015-11-04 NOTE — Progress Notes (Signed)
*  PRELIMINARY RESULTS* Echocardiogram 2D Echocardiogram has been performed.  Stacey DrainWhite, Aristide Waggle J 11/04/2015, 10:13 AM

## 2015-11-20 ENCOUNTER — Encounter: Payer: Self-pay | Admitting: Nurse Practitioner

## 2015-11-20 ENCOUNTER — Other Ambulatory Visit: Payer: Self-pay

## 2015-11-20 ENCOUNTER — Ambulatory Visit (INDEPENDENT_AMBULATORY_CARE_PROVIDER_SITE_OTHER): Payer: Medicare Other | Admitting: Nurse Practitioner

## 2015-11-20 VITALS — BP 142/76 | HR 85 | Temp 97.9°F | Ht 62.0 in | Wt 120.0 lb

## 2015-11-20 DIAGNOSIS — R5383 Other fatigue: Secondary | ICD-10-CM | POA: Diagnosis not present

## 2015-11-20 DIAGNOSIS — Z1211 Encounter for screening for malignant neoplasm of colon: Secondary | ICD-10-CM | POA: Insufficient documentation

## 2015-11-20 DIAGNOSIS — R1310 Dysphagia, unspecified: Secondary | ICD-10-CM

## 2015-11-20 DIAGNOSIS — Z01810 Encounter for preprocedural cardiovascular examination: Secondary | ICD-10-CM | POA: Insufficient documentation

## 2015-11-20 DIAGNOSIS — R634 Abnormal weight loss: Secondary | ICD-10-CM | POA: Diagnosis not present

## 2015-11-20 MED ORDER — PEG 3350-KCL-NA BICARB-NACL 420 G PO SOLR: 4000.0000 mL | ORAL | 0 refills | Status: DC

## 2015-11-20 NOTE — Assessment & Plan Note (Signed)
Noted unintentional weight loss of approximately 20-25 pounds in the past year in addition to dysphagia and with no colonoscopy in the past 28 years. The patient had a poor experience with her last colonoscopy with no sedation and has avoided them since. At this point we'll proceed with endoscopy as noted above as well as a colonoscopy as noted below. She states her weight loss is likely due to not eating enough, notes poor appetite in addition to fatigue. It is also noted on exam that she has very poor dentition. Return for follow-up based on postprocedure recommendations.

## 2015-11-20 NOTE — Progress Notes (Signed)
Primary Care Physician:  Wilson SingerGOSRANI,NIMISH C, MD Primary Gastroenterologist:  Dr. Jena Gaussourk  Chief Complaint  Patient presents with  . Dysphagia    feels like food gets stuck in throat  . Weight Loss    lost approx 20 lbs in past year, gets full early, doesn't take much to fill up    HPI:   Karen Church is a 65 y.o. female who presents on referral from primary care for abnormal weight loss and dysphagia. PCP notes reviewed. Patient last saw primary care on 10/22/2015 at which point she was describing intermittent fatigue, unintentional weight loss of 20 pounds in the last year, dysphagia, decreased appetite. No history of colonoscopy found in our system.  Today she states her dysphagia symptoms started a few months ago. "Food gets stuck, wont go up or down. I can get up the next morning and belch up what I ate the night before." Has had an unintentional weight loss of about 20-25 pounds in the past year. States she eats what she wants. Also decreased energy lately which she attributes to getting older. Occasional/rare abdominal pain, N/V, hematochezia, melena. Had a heme card last year at the health department which she states was negative. Denies acute changes in bowel habits/stool consistency. Typically has a bowel at least once a day, consistent with [facility]. Denies GERD symptoms. Denies chest pain, worsening dyspnea, dizziness, lightheadedness, syncope, near syncope. Denies any other upper or lower GI symptoms.  Last colonoscopy was when she was 37, about 28 years ago.  Past Medical History:  Diagnosis Date  . Carpal tunnel syndrome   . COPD (chronic obstructive pulmonary disease) (HCC)     Past Surgical History:  Procedure Laterality Date  . ABDOMINAL HYSTERECTOMY    . CESAREAN SECTION    . SKIN CANCER EXCISION    . SMALL INTESTINE SURGERY N/A 1967   Resection    Current Outpatient Prescriptions  Medication Sig Dispense Refill  . aspirin EC 81 MG tablet Take 81 mg by mouth  every other day.    . Multiple Vitamin (MULTIVITAMIN WITH MINERALS) TABS Take 1 tablet by mouth daily.    . naproxen sodium (ANAPROX) 220 MG tablet Take 220-440 mg by mouth as needed.     No current facility-administered medications for this visit.     Allergies as of 11/20/2015  . (No Known Allergies)    Family History  Problem Relation Age of Onset  . Colon cancer Neg Hx     Social History   Social History  . Marital status: Married    Spouse name: N/A  . Number of children: N/A  . Years of education: N/A   Occupational History  . Not on file.   Social History Main Topics  . Smoking status: Current Every Day Smoker    Packs/day: 0.50    Types: Cigarettes  . Smokeless tobacco: Never Used  . Alcohol use No  . Drug use: No  . Sexual activity: Not on file   Other Topics Concern  . Not on file   Social History Narrative  . No narrative on file    Review of Systems: General: Negative for anorexia, weight loss, fever, chills, fatigue, weakness. ENT: Negative for hoarseness. Admits dysphagia. CV: Negative for chest pain, angina, palpitations, peripheral edema.  Respiratory: Negative for dyspnea at rest, cough, sputum, wheezing.  GI: See history of present illness. MS: Negative for joint pain, low back pain.  Derm: Negative for rash or itching.  Endo:  Negative for unusual weight change.  Heme: Negative for bruising or bleeding. Allergy: Negative for rash or hives.    Physical Exam: BP (!) 142/76   Pulse 85   Temp 97.9 F (36.6 C) (Oral)   Ht 5\' 2"  (1.575 m)   Wt 120 lb (54.4 kg)   BMI 21.95 kg/m  General:   Alert and oriented. Pleasant and cooperative. Well-nourished and well-developed.  Head:  Normocephalic and atraumatic. Eyes:  Without icterus, sclera clear and conjunctiva pink.  Ears:  Normal auditory acuity. Mouth:  Very poor dentition noted. Cardiovascular:  S1, S2 present without murmurs appreciated. Extremities without clubbing or  edema. Respiratory:  Clear to auscultation bilaterally. No wheezes, rales, or rhonchi. No distress.  Gastrointestinal:  +BS, soft, and non-distended. Mild epigastric TTP. No HSM noted. No guarding or rebound. No masses appreciated.  Rectal:  Deferred  Musculoskalatal:  Symmetrical without gross deformities. Neurologic:  Alert and oriented x4;  grossly normal neurologically. Psych:  Alert and cooperative. Normal mood and affect. Heme/Lymph/Immune: No excessive bruising noted.    11/20/2015 9:19 AM   Disclaimer: This note was dictated with voice recognition software. Similar sounding words can inadvertently be transcribed and may not be corrected upon review.

## 2015-11-20 NOTE — Patient Instructions (Signed)
1. We will schedule your procedures for you. 2. Further recommendations to be based on the results of your procedure. 3. Return for follow-up based on recommendations made after your procedures.

## 2015-11-20 NOTE — Assessment & Plan Note (Signed)
She notes increasing fatigue which she has attributed to "getting older." Denies chest pain or worsening dyspnea (she does have baseline COPD). Fatigue in addition to weight loss, dysphagia, no colonoscopy in 2-3 decades is concerning. Further workup as noted below.

## 2015-11-20 NOTE — Progress Notes (Signed)
CC'ED TO PCP 

## 2015-11-20 NOTE — Assessment & Plan Note (Signed)
The patient's last colonoscopy was at age 65. She had a poor experience and has not had another one since. She has a host of symptoms today including dysphagia, weight loss, fatigue. She is significantly overdue for a colonoscopy. Given her presentation we will had a colonoscopy onto her upper endoscopy as needed for dysphagia. She is much more comfortable with having one now after talking with several friends who've explained that it is much different than it was at her last procedure. Return for follow-up based on postprocedure recommendations.  Proceed with TCS with Dr. Jena Gaussourk in near future: the risks, benefits, and alternatives have been discussed with the patient in detail. The patient states understanding and desires to proceed.  The patient is not on any anticoagulants, anxiolytics, chronic pain medications, or antidepressants. Conscious sedation should be adequate for her procedure.

## 2015-11-20 NOTE — Assessment & Plan Note (Signed)
Patient with worsening dysphagia and regurgitation later in the day/next day. This dysphagia in addition to other symptoms such as fatigue, weight loss are concerning. Denies GERD symptoms although she is tender to palpation in the epigastric area. Cannot rule out stricture, web, ring, or other more occult process. We'll proceed with upper endoscopy, in addition to her needed colonoscopy, to further evaluate. Return for follow-up based on postprocedure recommendations.  Proceed with EGD +/- dilation with Dr. Jena Gaussourk in near future: the risks, benefits, and alternatives have been discussed with the patient in detail. The patient states understanding and desires to proceed.  The patient is not on any anticoagulants, anxiolytics, chronic pain medications, or antidepressants. Conscious sedation should be adequate for her procedure.

## 2015-12-09 ENCOUNTER — Other Ambulatory Visit: Payer: Self-pay

## 2015-12-09 ENCOUNTER — Telehealth: Payer: Self-pay

## 2015-12-09 MED ORDER — PEG 3350-KCL-NA BICARB-NACL 420 G PO SOLR: 4000.0000 mL | ORAL | 0 refills | Status: DC

## 2015-12-09 NOTE — Telephone Encounter (Signed)
Pt left message on voicemail that she is scheduled for TCS tomorrow and she mixed up her prep too soon. [facility]. Rx for Trilyte sent to pharmacy. LMOM and informed pt.

## 2015-12-10 ENCOUNTER — Encounter (HOSPITAL_COMMUNITY)
Admission: RE | Disposition: A | Discharge status: Home / Self Care | Payer: Self-pay | Source: Ambulatory Visit | Attending: Internal Medicine

## 2015-12-10 ENCOUNTER — Encounter (HOSPITAL_COMMUNITY): Payer: Self-pay

## 2015-12-10 ENCOUNTER — Ambulatory Visit (HOSPITAL_COMMUNITY)
Admission: RE | Admit: 2015-12-10 | Discharge: 2015-12-10 | Disposition: A | Discharge status: Home / Self Care | Payer: Medicare Other | Source: Ambulatory Visit | Attending: Internal Medicine | Admitting: Internal Medicine

## 2015-12-10 DIAGNOSIS — J449 Chronic obstructive pulmonary disease, unspecified: Secondary | ICD-10-CM | POA: Insufficient documentation

## 2015-12-10 DIAGNOSIS — F17210 Nicotine dependence, cigarettes, uncomplicated: Secondary | ICD-10-CM | POA: Insufficient documentation

## 2015-12-10 DIAGNOSIS — R1310 Dysphagia, unspecified: Secondary | ICD-10-CM | POA: Insufficient documentation

## 2015-12-10 DIAGNOSIS — Z1212 Encounter for screening for malignant neoplasm of rectum: Secondary | ICD-10-CM

## 2015-12-10 DIAGNOSIS — Z85828 Personal history of other malignant neoplasm of skin: Secondary | ICD-10-CM | POA: Insufficient documentation

## 2015-12-10 DIAGNOSIS — Z7982 Long term (current) use of aspirin: Secondary | ICD-10-CM | POA: Diagnosis not present

## 2015-12-10 DIAGNOSIS — R634 Abnormal weight loss: Secondary | ICD-10-CM

## 2015-12-10 DIAGNOSIS — K3189 Other diseases of stomach and duodenum: Secondary | ICD-10-CM

## 2015-12-10 DIAGNOSIS — Z1211 Encounter for screening for malignant neoplasm of colon: Secondary | ICD-10-CM | POA: Insufficient documentation

## 2015-12-10 HISTORY — PX: COLONOSCOPY: SHX5424

## 2015-12-10 HISTORY — PX: BIOPSY: SHX5522

## 2015-12-10 HISTORY — PX: ESOPHAGOGASTRODUODENOSCOPY: SHX5428

## 2015-12-10 HISTORY — PX: MALONEY DILATION: SHX5535

## 2015-12-10 SURGERY — COLONOSCOPY
Anesthesia: Moderate Sedation

## 2015-12-10 MED ORDER — MIDAZOLAM HCL 5 MG/5ML IJ SOLN
INTRAMUSCULAR | Status: AC
  Filled 2015-12-10: qty 10

## 2015-12-10 MED ORDER — SODIUM CHLORIDE 0.9 % IV SOLN
INTRAVENOUS | Status: DC
Start: 2015-12-10 — End: 2015-12-10
  Administered 2015-12-10: 10:00:00 via INTRAVENOUS

## 2015-12-10 MED ORDER — ONDANSETRON HCL 4 MG/2ML IJ SOLN
INTRAMUSCULAR | Status: AC
  Filled 2015-12-10: qty 2

## 2015-12-10 MED ORDER — MIDAZOLAM HCL 5 MG/5ML IJ SOLN
INTRAMUSCULAR | Status: DC | PRN
  Administered 2015-12-10: 1 mg via INTRAVENOUS
  Administered 2015-12-10: 2 mg via INTRAVENOUS
  Administered 2015-12-10 (×2): 1 mg via INTRAVENOUS

## 2015-12-10 MED ORDER — LIDOCAINE VISCOUS 2 % MT SOLN
OROMUCOSAL | Status: AC
  Filled 2015-12-10: qty 15

## 2015-12-10 MED ORDER — STERILE WATER FOR IRRIGATION IR SOLN
Status: DC | PRN
  Administered 2015-12-10: 2.5 mL

## 2015-12-10 MED ORDER — MEPERIDINE HCL 100 MG/ML IJ SOLN
INTRAMUSCULAR | Status: AC
  Filled 2015-12-10: qty 2

## 2015-12-10 MED ORDER — LIDOCAINE VISCOUS 2 % MT SOLN
OROMUCOSAL | Status: DC | PRN
  Administered 2015-12-10: 1 via OROMUCOSAL

## 2015-12-10 MED ORDER — MEPERIDINE HCL 100 MG/ML IJ SOLN
INTRAMUSCULAR | Status: DC | PRN
  Administered 2015-12-10: 25 mg via INTRAVENOUS
  Administered 2015-12-10: 50 mg via INTRAVENOUS
  Administered 2015-12-10: 25 mg via INTRAVENOUS

## 2015-12-10 MED ORDER — ONDANSETRON HCL 4 MG/2ML IJ SOLN
INTRAMUSCULAR | Status: DC | PRN
  Administered 2015-12-10: 4 mg via INTRAVENOUS

## 2015-12-10 NOTE — Discharge Instructions (Addendum)
EGD Discharge instructions Please read the instructions outlined below and refer to this sheet in the next few weeks. These discharge instructions provide you with general information on caring for yourself after you leave the hospital. Your doctor may also give you specific instructions. While your treatment has been planned according to the most current medical practices available, unavoidable complications occasionally occur. If you have any problems or questions after discharge, please call your doctor. ACTIVITY  You may resume your regular activity but move at a slower pace for the next 24 hours.   Take frequent rest periods for the next 24 hours.   Walking will help expel (get rid of) the air and reduce the bloated feeling in your abdomen.   No driving for 24 hours (because of the anesthesia (medicine) used during the test).   You may shower.   Do not sign any important legal documents or operate any machinery for 24 hours (because of the anesthesia used during the test).  NUTRITION  Drink plenty of fluids.   You may resume your normal diet.   Begin with a light meal and progress to your normal diet.   Avoid alcoholic beverages for 24 hours or as instructed by your caregiver.  MEDICATIONS  You may resume your normal medications unless your caregiver tells you otherwise.  WHAT YOU CAN EXPECT TODAY  You may experience abdominal discomfort such as a feeling of fullness or gas pains.  FOLLOW-UP  Your doctor will discuss the results of your test with you.  SEEK IMMEDIATE MEDICAL ATTENTION IF ANY OF THE FOLLOWING OCCUR:  Excessive nausea (feeling sick to your stomach) and/or vomiting.   Severe abdominal pain and distention (swelling).   Trouble swallowing.   Temperature over 101 F (37.8 C).   Rectal bleeding or vomiting of blood.   Colonoscopy Discharge Instructions  Read the instructions outlined below and refer to this sheet in the next few weeks. These  discharge instructions provide you with general information on caring for yourself after you leave the hospital. Your doctor may also give you specific instructions. While your treatment has been planned according to the most current medical practices available, unavoidable complications occasionally occur. If you have any problems or questions after discharge, call Dr. Gala Romney at [phone]. ACTIVITY  You may resume your regular activity, but move at a slower pace for the next 24 hours.   Take frequent rest periods for the next 24 hours.   Walking will help get rid of the air and reduce the bloated feeling in your belly (abdomen).   No driving for 24 hours (because of the medicine (anesthesia) used during the test).    Do not sign any important legal documents or operate any machinery for 24 hours (because of the anesthesia used during the test).  NUTRITION  Drink plenty of fluids.   You may resume your normal diet as instructed by your doctor.   Begin with a light meal and progress to your normal diet. Heavy or fried foods are harder to digest and may make you feel sick to your stomach (nauseated).   Avoid alcoholic beverages for 24 hours or as instructed.  MEDICATIONS  You may resume your normal medications unless your doctor tells you otherwise.  WHAT YOU CAN EXPECT TODAY  Some feelings of bloating in the abdomen.   Passage of more gas than usual.   Spotting of blood in your stool or on the toilet paper.  IF YOU HAD POLYPS REMOVED DURING THE COLONOSCOPY:  No aspirin products for 7 days or as instructed.   No alcohol for 7 days or as instructed.   Eat a soft diet for the next 24 hours.  FINDING OUT THE RESULTS OF YOUR TEST Not all test results are available during your visit. If your test results are not back during the visit, make an appointment with your caregiver to find out the results. Do not assume everything is normal if you have not heard from your caregiver or the  medical facility. It is important for you to follow up on all of your test results.  SEEK IMMEDIATE MEDICAL ATTENTION IF:  You have more than a spotting of blood in your stool.   Your belly is swollen (abdominal distention).   You are nauseated or vomiting.   You have a temperature over 101.   You have abdominal pain or discomfort that is severe or gets worse throughout the day.    Repeat colonoscopy for screening purposes in 10 years  Chloraseptic spray for any transient sore throat that may be experienced  Further recommendations to follow pending review of pathology report

## 2015-12-10 NOTE — Interval H&P Note (Signed)
History and Physical Interval Note:  12/10/2015 9:44 AM  Karen Church  has presented today for surgery, with the diagnosis of WEIGHT LOSS/DYSPHAGIA/SCREENING  The various methods of treatment have been discussed with the patient and family. After consideration of risks, benefits and other options for treatment, the patient has consented to  Procedure(s) with comments: COLONOSCOPY (N/A) - 1030 ESOPHAGOGASTRODUODENOSCOPY (EGD) (N/A) SAVORY DILATION (N/A) MALONEY DILATION (N/A) as a surgical intervention .  The patient's history has been reviewed, patient examined, no change in status, stable for surgery.  I have reviewed the patient's chart and labs.  Questions were answered to the patient's satisfaction.     [name]  No change. EGD/ED and colonoscopy per plan.  The risks, benefits, limitations, imponderables and alternatives regarding both EGD and colonoscopy have been reviewed with the patient. Questions have been answered. All parties agreeable.

## 2015-12-10 NOTE — Op Note (Signed)
[facility] Patient Name: Karen Church Procedure Date: 12/10/2015 9:40 AM MRN: [id number] Date of Birth: 15-Jan-1951 Attending MD: Gennette Pacobert Michael Rourk , MD CSN: [id number] Age: [age] Admit Type: Outpatient Procedure:                [facility]                            gastric biopsy. Indications:              Dysphagia Providers:                Gennette Pacobert Michael Rourk, MD, Toniann FailBonnie Pritchett RN, RN,                            Nena PolioLisa Moore, RN, Burke Keelsrisann Tilley, Technician Referring MD:             Wilson SingerNimish C. Gosrani MD, MD Medicines:                Midazolam 4 mg IV, Meperidine 75 mg IV, Ondansetron                            4 mg IV Complications:            No immediate complications. Estimated Blood Loss:     Estimated blood loss was minimal. Estimated blood                            loss was minimal. Procedure:                Pre-Anesthesia Assessment:                           - Prior to the procedure, a History and Physical                            was performed, and patient medications and                            allergies were reviewed. The patient's tolerance of                            previous anesthesia was also reviewed. The risks                            and benefits of the procedure and the sedation                            options and risks were discussed with the patient.                            All questions were answered, and informed consent                            was obtained. Prior Anticoagulants: The patient has  taken no previous anticoagulant or antiplatelet                            agents. ASA Grade Assessment: II - A patient with                            mild systemic disease. After reviewing the risks                            and benefits, the patient was deemed in                            satisfactory condition to undergo the procedure.                           After  obtaining informed consent, the endoscope was                            passed under direct vision. Throughout the                            procedure, the patient's blood pressure, pulse, and                            oxygen saturations were monitored continuously. The                            [id number] ([id number]) was introduced through the                            mouth, and advanced to the second part of duodenum.                            The upper GI endoscopy was accomplished without                            difficulty. The patient tolerated the procedure                            well. Scope In: 9:56:02 AM Scope Out: 10:03:01 AM Total Procedure Duration: 0 hours 6 minutes 59 seconds  Findings:      The examined esophagus was normal. The scope was withdrawn.      Diffuse congested mucosa was found in the entire examined stomach. This       was biopsied with a cold forceps for histology.      The duodenal bulb and second portion of the duodenum were normal.       Dilation was performed with a Maloney dilator with mild resistance at 54       Fr. The dilation site was examined following endoscope reinsertion and       showed mild improvement in luminal narrowing. Estimated blood loss was       minimal. Impression:               - Normal esophagus. status post Dilation                           -  Congestive gastropathy. Biopsied. Moderate Sedation:      Moderate (conscious) sedation was administered by the endoscopy nurse       and supervised by the endoscopist. The following parameters were       monitored: oxygen saturation, heart rate, blood pressure, and response       to care. Total physician intraservice time was 17 minutes. Recommendation:           - Patient has a contact number available for                            emergencies. The signs and symptoms of potential                            delayed complications were discussed with the                             patient. Return to normal activities tomorrow.                            Written discharge instructions were provided to the                            patient.                           - Resume previous diet.                           - Continue present medications.                           - Await pathology results.                           - No repeat upper endoscopy.                           - Return to GI office in 12 weeks. Procedure Code(s):        --- Professional ---                           [phone], Esophagogastroduodenoscopy, flexible,                            transoral; with biopsy, single or multiple                           43450, Dilation of esophagus, by unguided sound or                            bougie, single or multiple passes                           99152, Moderate sedation services provided by the  same physician or other qualified health care                            professional performing the diagnostic or                            therapeutic service that the sedation supports,                            requiring the presence of an independent trained                            observer to assist in the monitoring of the                            patient's level of consciousness and physiological                            status; initial 15 minutes of intraservice time,                            patient age 65 years or older Diagnosis Code(s):        --- Professional ---                           K31.89, Other diseases of stomach and duodenum                           R13.10, Dysphagia, unspecified CPT copyright 2016 American Medical Association. All rights reserved. The codes documented in this report are preliminary and upon coder review may  be revised to meet current compliance requirements. Gerrit Friendsobert M. Rourk, MD Gennette Pacobert Michael Rourk, MD 12/10/2015 10:29:02 AM This report has been signed electronically. Number of  Addenda: 0

## 2015-12-10 NOTE — Progress Notes (Signed)
Patient's teeth noted to be intact and unchanged after the procedure.

## 2015-12-10 NOTE — H&P (View-Only) (Signed)
Primary Care Physician:  Wilson SingerGOSRANI,NIMISH C, MD Primary Gastroenterologist:  Dr. Jena Gaussourk  Chief Complaint  Patient presents with  . Dysphagia    feels like food gets stuck in throat  . Weight Loss    lost approx 20 lbs in past year, gets full early, doesn't take much to fill up    HPI:   Karen Church is a 65 y.o. female who presents on referral from primary care for abnormal weight loss and dysphagia. PCP notes reviewed. Patient last saw primary care on 10/22/2015 at which point she was describing intermittent fatigue, unintentional weight loss of 20 pounds in the last year, dysphagia, decreased appetite. No history of colonoscopy found in our system.  Today she states her dysphagia symptoms started a few months ago. "Food gets stuck, wont go up or down. I can get up the next morning and belch up what I ate the night before." Has had an unintentional weight loss of about 20-25 pounds in the past year. States she eats what she wants. Also decreased energy lately which she attributes to getting older. Occasional/rare abdominal pain, N/V, hematochezia, melena. Had a heme card last year at the health department which she states was negative. Denies acute changes in bowel habits/stool consistency. Typically has a bowel at least once a day, consistent with [facility]. Denies GERD symptoms. Denies chest pain, worsening dyspnea, dizziness, lightheadedness, syncope, near syncope. Denies any other upper or lower GI symptoms.  Last colonoscopy was when she was 37, about 28 years ago.  Past Medical History:  Diagnosis Date  . Carpal tunnel syndrome   . COPD (chronic obstructive pulmonary disease) (HCC)     Past Surgical History:  Procedure Laterality Date  . ABDOMINAL HYSTERECTOMY    . CESAREAN SECTION    . SKIN CANCER EXCISION    . SMALL INTESTINE SURGERY N/A 1967   Resection    Current Outpatient Prescriptions  Medication Sig Dispense Refill  . aspirin EC 81 MG tablet Take 81 mg by mouth  every other day.    . Multiple Vitamin (MULTIVITAMIN WITH MINERALS) TABS Take 1 tablet by mouth daily.    . naproxen sodium (ANAPROX) 220 MG tablet Take 220-440 mg by mouth as needed.     No current facility-administered medications for this visit.     Allergies as of 11/20/2015  . (No Known Allergies)    Family History  Problem Relation Age of Onset  . Colon cancer Neg Hx     Social History   Social History  . Marital status: Married    Spouse name: N/A  . Number of children: N/A  . Years of education: N/A   Occupational History  . Not on file.   Social History Main Topics  . Smoking status: Current Every Day Smoker    Packs/day: 0.50    Types: Cigarettes  . Smokeless tobacco: Never Used  . Alcohol use No  . Drug use: No  . Sexual activity: Not on file   Other Topics Concern  . Not on file   Social History Narrative  . No narrative on file    Review of Systems: General: Negative for anorexia, weight loss, fever, chills, fatigue, weakness. ENT: Negative for hoarseness. Admits dysphagia. CV: Negative for chest pain, angina, palpitations, peripheral edema.  Respiratory: Negative for dyspnea at rest, cough, sputum, wheezing.  GI: See history of present illness. MS: Negative for joint pain, low back pain.  Derm: Negative for rash or itching.  Endo:  Negative for unusual weight change.  Heme: Negative for bruising or bleeding. Allergy: Negative for rash or hives.    Physical Exam: BP (!) 142/76   Pulse 85   Temp 97.9 F (36.6 C) (Oral)   Ht 5' 2" (1.575 m)   Wt 120 lb (54.4 kg)   BMI 21.95 kg/m  General:   Alert and oriented. Pleasant and cooperative. Well-nourished and well-developed.  Head:  Normocephalic and atraumatic. Eyes:  Without icterus, sclera clear and conjunctiva pink.  Ears:  Normal auditory acuity. Mouth:  Very poor dentition noted. Cardiovascular:  S1, S2 present without murmurs appreciated. Extremities without clubbing or  edema. Respiratory:  Clear to auscultation bilaterally. No wheezes, rales, or rhonchi. No distress.  Gastrointestinal:  +BS, soft, and non-distended. Mild epigastric TTP. No HSM noted. No guarding or rebound. No masses appreciated.  Rectal:  Deferred  Musculoskalatal:  Symmetrical without gross deformities. Neurologic:  Alert and oriented x4;  grossly normal neurologically. Psych:  Alert and cooperative. Normal mood and affect. Heme/Lymph/Immune: No excessive bruising noted.    11/20/2015 9:19 AM   Disclaimer: This note was dictated with voice recognition software. Similar sounding words can inadvertently be transcribed and may not be corrected upon review.  

## 2015-12-10 NOTE — Op Note (Signed)
[facility] Patient Name: Karen CharterMary Church Procedure Date: 12/10/2015 10:04 AM MRN: [id number] Date of Birth: 10/12/1950 Attending MD: Gennette Pacobert Michael Morgaine Kimball , MD CSN: [id number] Age: [age] Admit Type: Outpatient Procedure:                Colonoscopy Indications:              Screening for colorectal malignant neoplasm Providers:                Gennette Pacobert Michael Aylinn Rydberg, MD, Toniann FailBonnie Pritchett RN, RN,                            Nena PolioLisa Moore, RN, Burke Keelsrisann Tilley, Technician Referring MD:              Medicines:                Midazolam 5 mg IV, Meperidine 100 mg IV,                            Ondansetron 4 mg IV Complications:            No immediate complications. Estimated Blood Loss:     Estimated blood loss: none. Procedure:                Pre-Anesthesia Assessment:                           - Prior to the procedure, a History and Physical                            was performed, and patient medications and                            allergies were reviewed. The patient's tolerance of                            previous anesthesia was also reviewed. The risks                            and benefits of the procedure and the sedation                            options and risks were discussed with the patient.                            All questions were answered, and informed consent                            was obtained. Prior Anticoagulants: The patient has                            taken no previous anticoagulant or antiplatelet                            agents. ASA Grade Assessment: II - A patient with  mild systemic disease. After reviewing the risks                            and benefits, the patient was deemed in                            satisfactory condition to undergo the procedure.                           - Prior to the procedure, a History and Physical                            was performed, and patient medications and     allergies were reviewed. The patient's tolerance of                            previous anesthesia was also reviewed. The risks                            and benefits of the procedure and the sedation                            options and risks were discussed with the patient.                            All questions were answered, and informed consent                            was obtained. Prior Anticoagulants: The patient has                            taken no previous anticoagulant or antiplatelet                            agents. ASA Grade Assessment: II - A patient with                            mild systemic disease. After reviewing the risks                            and benefits, the patient was deemed in                            satisfactory condition to undergo the procedure.                           After obtaining informed consent, the colonoscope                            was passed under direct vision. Throughout the                            procedure, the patient's blood pressure, pulse, and  oxygen saturations were monitored continuously. The                            EC-[id number] ([id number]) scope was introduced through                            the anus and advanced to the the cecum, identified                            by appendiceal orifice and ileocecal valve. The                            colonoscopy was performed without difficulty. The                            patient tolerated the procedure well. The quality                            of the bowel preparation was adequate. The entire                            colon was well visualized. The ileocecal valve,                            appendiceal orifice, and rectum were photographed. Scope In: 10:09:20 AM Scope Out: 10:21:09 AM Scope Withdrawal Time: 0 hours 6 minutes 20 seconds  Total Procedure Duration: 0 hours 11 minutes 49 seconds  Findings:      The perianal and  digital rectal examinations were normal.      The colon (entire examined portion) appeared normal.      The exam was otherwise without abnormality on direct and retroflexion       views. Impression:               - The entire examined colon is normal.                           - The examination was otherwise normal on direct                            and retroflexion views.                           - No specimens collected. Moderate Sedation:      Moderate (conscious) sedation was administered by the endoscopy nurse       and supervised by the endoscopist. The following parameters were       monitored: oxygen saturation, heart rate, blood pressure, respiratory       rate, EKG, adequacy of pulmonary ventilation, and response to care.       Total physician intraservice time was 34 minutes. Recommendation:           - Patient has a contact number available for                            emergencies. The signs and symptoms of potential  delayed complications were discussed with the                            patient. Return to normal activities tomorrow.                            Written discharge instructions were provided to the                            patient.                           - Resume previous diet.                           - Continue present medications.                           - Repeat colonoscopy in 10 years for screening                            purposes.                           - Return to GI clinic in 12 weeks. Procedure Code(s):        --- Professional ---                           [phone], Colonoscopy, flexible; diagnostic, including                            collection of specimen(s) by brushing or washing,                            when performed (separate procedure)                           99152, Moderate sedation services provided by the                            same physician or other qualified health care                             professional performing the diagnostic or                            therapeutic service that the sedation supports,                            requiring the presence of an independent trained                            observer to assist in the monitoring of the                            patient's level of consciousness and physiological  status; initial 15 minutes of intraservice time,                            patient age 65 years or older                           [phone], Moderate sedation services; each additional                            15 minutes intraservice time Diagnosis Code(s):        --- Professional ---                           Z12.11, Encounter for screening for malignant                            neoplasm of colon CPT copyright 2016 [employer]. All rights reserved. The codes documented in this report are preliminary and upon coder review may  be revised to meet current compliance requirements. Gerrit Friends. Taneisha Fuson, MD Gennette Pac, MD 12/10/2015 10:32:13 AM This report has been signed electronically. Number of Addenda: 0

## 2015-12-11 ENCOUNTER — Encounter (HOSPITAL_COMMUNITY): Payer: Self-pay | Admitting: Internal Medicine

## 2015-12-15 ENCOUNTER — Encounter: Payer: Self-pay | Admitting: Internal Medicine

## 2015-12-16 ENCOUNTER — Telehealth: Payer: Self-pay

## 2015-12-16 NOTE — Telephone Encounter (Signed)
Per RMR- Send letter to patient.  Send copy of letter with path to referring provider and PCP.   Patient needs PrevPak or generic equivalent x 14 days--hold any acid suppression and/or statin therapy patient may be taking during treatment.   Needs to keep 12 weekOV

## 2015-12-16 NOTE — Telephone Encounter (Signed)
Letter mailed to the pt. 

## 2015-12-20 MED ORDER — AMOXICILL-CLARITHRO-LANSOPRAZ PO MISC: Freq: Two times a day (BID) | ORAL | 0 refills | Status: DC

## 2015-12-20 NOTE — Telephone Encounter (Signed)
Pt is aware. She is not taking any ppi's or statins. [facility] She will call me on Monday if the insurance doesn't cover it or if its too expensive.

## 2015-12-23 ENCOUNTER — Telehealth: Payer: Self-pay | Admitting: Internal Medicine

## 2015-12-23 ENCOUNTER — Encounter: Payer: Self-pay | Admitting: Internal Medicine

## 2015-12-23 ENCOUNTER — Encounter: Payer: Self-pay | Admitting: Nurse Practitioner

## 2015-12-23 NOTE — Telephone Encounter (Signed)
Received a fax from the pharmacy. prevpac not covered. I called in amox 1 gr bid x 14 days, biaxin 500mg  bid x 14 days and lansoprazole 30mg  bid x 14 days [facility].

## 2015-12-23 NOTE — Telephone Encounter (Signed)
Patient called inquiring about the prescription that she is supposed to get. [phone]

## 2015-12-23 NOTE — Telephone Encounter (Signed)
OV made and letter mailed °

## 2015-12-25 NOTE — Telephone Encounter (Signed)
Tried to call pt- NA- LMOM 

## 2016-02-21 ENCOUNTER — Emergency Department (HOSPITAL_COMMUNITY): Payer: Medicare HMO

## 2016-02-21 ENCOUNTER — Emergency Department (HOSPITAL_COMMUNITY)
Admission: EM | Admit: 2016-02-21 | Discharge: 2016-02-21 | Disposition: A | Discharge status: Home / Self Care | Payer: Medicare HMO | Attending: Emergency Medicine | Admitting: Emergency Medicine

## 2016-02-21 ENCOUNTER — Encounter (HOSPITAL_COMMUNITY): Payer: Self-pay | Admitting: Emergency Medicine

## 2016-02-21 DIAGNOSIS — J029 Acute pharyngitis, unspecified: Secondary | ICD-10-CM | POA: Diagnosis present

## 2016-02-21 DIAGNOSIS — J9801 Acute bronchospasm: Secondary | ICD-10-CM

## 2016-02-21 DIAGNOSIS — F17210 Nicotine dependence, cigarettes, uncomplicated: Secondary | ICD-10-CM | POA: Diagnosis not present

## 2016-02-21 DIAGNOSIS — Z79899 Other long term (current) drug therapy: Secondary | ICD-10-CM | POA: Diagnosis not present

## 2016-02-21 DIAGNOSIS — J181 Lobar pneumonia, unspecified organism: Secondary | ICD-10-CM | POA: Diagnosis not present

## 2016-02-21 DIAGNOSIS — R05 Cough: Secondary | ICD-10-CM | POA: Diagnosis not present

## 2016-02-21 DIAGNOSIS — J189 Pneumonia, unspecified organism: Secondary | ICD-10-CM | POA: Diagnosis not present

## 2016-02-21 DIAGNOSIS — R69 Illness, unspecified: Secondary | ICD-10-CM | POA: Diagnosis not present

## 2016-02-21 MED ORDER — ALBUTEROL SULFATE (5 MG/ML) 0.5% IN NEBU: 2.5000 mg | INHALATION_SOLUTION | Freq: Four times a day (QID) | RESPIRATORY_TRACT | 1 refills | Status: DC | PRN

## 2016-02-21 MED ORDER — PREDNISONE 20 MG PO TABS
40.0000 mg | ORAL_TABLET | Freq: Once | ORAL | Status: AC
  Administered 2016-02-21: 40 mg via ORAL
  Filled 2016-02-21: qty 2

## 2016-02-21 MED ORDER — PREDNISONE 10 MG PO TABS: 20.0000 mg | ORAL_TABLET | Freq: Two times a day (BID) | ORAL | 0 refills | Status: DC

## 2016-02-21 MED ORDER — GUAIFENESIN-CODEINE 100-10 MG/5ML PO SOLN: 5.0000 mL | Freq: Four times a day (QID) | ORAL | 0 refills | Status: DC | PRN

## 2016-02-21 MED ORDER — IPRATROPIUM-ALBUTEROL 0.5-2.5 (3) MG/3ML IN SOLN
3.0000 mL | Freq: Once | RESPIRATORY_TRACT | Status: AC
  Administered 2016-02-21: 3 mL via RESPIRATORY_TRACT
  Filled 2016-02-21: qty 3

## 2016-02-21 MED ORDER — AMOXICILLIN-POT CLAVULANATE 875-125 MG PO TABS: 1.0000 | ORAL_TABLET | Freq: Two times a day (BID) | ORAL | 0 refills | Status: DC

## 2016-02-21 MED ORDER — AMOXICILLIN-POT CLAVULANATE 875-125 MG PO TABS
1.0000 | ORAL_TABLET | Freq: Once | ORAL | Status: AC
  Administered 2016-02-21: 1 via ORAL
  Filled 2016-02-21: qty 1

## 2016-02-21 MED ORDER — DOXYCYCLINE HYCLATE 100 MG PO CAPS: 100.0000 mg | ORAL_CAPSULE | Freq: Two times a day (BID) | ORAL | 0 refills | Status: DC

## 2016-02-21 NOTE — ED Notes (Signed)
Treatment finished

## 2016-02-21 NOTE — ED Triage Notes (Signed)
Patient complains of cough and sore throat that started a week ago. Denies N/V/D. NAD.

## 2016-02-21 NOTE — ED Provider Notes (Signed)
[facility] Provider Note   CSN: [id number] Arrival date & time: 02/21/16  1614     History   Chief Complaint Chief Complaint  Patient presents with  . Cough  . Sore Throat    HPI Karen Church is a 66 y.o. female who presents to the ED with cough, congestion, fever and chills that started a week ago. She feels short of breath. She has a hx of COPD and admits to smoking about a pack per day for the past 30+ years.   The history is provided by the patient. No language interpreter was used.  Cough  This is a new problem. The current episode started more than 1 week ago. The problem has been gradually worsening. The cough is productive of purulent sputum and productive of brown sputum. The maximum temperature recorded prior to her arrival was 100 to 100.9 F. Associated symptoms include chills, ear pain, myalgias and wheezing. Pertinent negatives include no headaches and no sore throat. She has tried cough syrup for the symptoms. The treatment provided no relief. She is a smoker. Her past medical history is significant for pneumonia and COPD.  Sore Throat  Pertinent negatives include no abdominal pain and no headaches.    Past Medical History:  Diagnosis Date  . Carpal tunnel syndrome   . COPD (chronic obstructive pulmonary disease) [facility](HCC)     Patient Active Problem List   Diagnosis Date Noted  . Loss of weight 11/20/2015  . Dysphagia 11/20/2015  . Fatigue 11/20/2015  . Encounter for screening colonoscopy 11/20/2015    Past Surgical History:  Procedure Laterality Date  . ABDOMINAL HYSTERECTOMY    . BIOPSY  12/10/2015   Procedure: BIOPSY;  Surgeon: Corbin Adeobert M Rourk, MD;  Location: [facility];  Service: Endoscopy;;  Gastric   . CESAREAN SECTION    . COLONOSCOPY N/A 12/10/2015   Procedure: COLONOSCOPY;  Surgeon: Corbin Adeobert M Rourk, MD;  Location: [facility];  Service: Endoscopy;  Laterality: N/A;  1030  . ESOPHAGOGASTRODUODENOSCOPY N/A 12/10/2015   Procedure:  ESOPHAGOGASTRODUODENOSCOPY (EGD);  Surgeon: Corbin Adeobert M Rourk, MD;  Location: [facility];  Service: Endoscopy;  Laterality: N/A;  . Elease HashimotoMALONEY DILATION N/A 12/10/2015   Procedure: Elease HashimotoMALONEY DILATION;  Surgeon: Corbin Adeobert M Rourk, MD;  Location: [facility];  Service: Endoscopy;  Laterality: N/A;  . SKIN CANCER EXCISION    . SMALL INTESTINE SURGERY N/A 1967   Resection    OB History    No data available       Home Medications    Prior to Admission medications   Medication Sig Start Date End Date Taking? Authorizing Provider  albuterol (PROVENTIL) (5 MG/ML) 0.5% nebulizer solution Take 0.5 mLs (2.5 mg total) by nebulization every 6 (six) hours as needed for wheezing or shortness of breath. 02/21/16   Hope Orlene OchM Neese, NP  amLODipine (NORVASC) 2.5 MG tablet Take 2.5 mg by mouth every evening.    Historical Provider, MD  amoxicillin-clarithromycin-lansoprazole [facility](PREVPAC) combo pack Take by mouth 2 (two) times daily. Follow package directions. 12/20/15   Corbin Adeobert M Rourk, MD  amoxicillin-clavulanate (AUGMENTIN) 875-125 MG tablet Take 1 tablet by mouth 2 (two) times daily. 02/21/16   Hope Orlene OchM Neese, NP  aspirin EC 81 MG tablet Take 81 mg by mouth every other day.    Historical Provider, MD  guaiFENesin-codeine 100-10 MG/5ML syrup Take 5 mLs by mouth every 6 (six) hours as needed for cough. 02/21/16   Hope Orlene OchM Neese, NP  Multiple Vitamin (MULTIVITAMIN  WITH MINERALS) TABS Take 1 tablet by mouth daily.    Historical Provider, MD  naproxen sodium (ANAPROX) 220 MG tablet Take 220-440 mg by mouth as needed (pain).     Historical Provider, MD  polyethylene glycol-electrolytes (TRILYTE) 420 g solution Take 4,000 mLs by mouth as directed. 11/20/15   Corbin Ade, MD  polyethylene glycol-electrolytes (TRILYTE) 420 g solution Take 4,000 mLs by mouth as directed. 12/09/15   Corbin Ade, MD  predniSONE (DELTASONE) 10 MG tablet Take 2 tablets (20 mg total) by mouth 2 (two) times daily with a meal. 02/21/16   Hope Orlene Och, NP     Family History Family History  Problem Relation Age of Onset  . Colon cancer Neg Hx     Social History Social History  Substance Use Topics  . Smoking status: Current Every Day Smoker    Packs/day: 0.50    Types: Cigarettes  . Smokeless tobacco: Never Used  . Alcohol use No     Allergies   Patient has no known allergies.   Review of Systems Review of Systems  Constitutional: Positive for chills. Fever: low grade.  HENT: Positive for congestion, ear pain and sinus pain. Negative for sore throat.   Respiratory: Positive for cough and wheezing.   Gastrointestinal: Negative for abdominal pain, diarrhea, nausea and vomiting.  Genitourinary: Negative for dysuria, frequency and urgency.  Musculoskeletal: Positive for myalgias.  Skin: Negative for rash.  Neurological: Positive for light-headedness. Negative for syncope and headaches.  Psychiatric/Behavioral: Negative for confusion. The patient is not nervous/anxious.      Physical Exam Updated Vital Signs BP 135/69 (BP Location: Left Arm)   Pulse 110   Temp 99.1 F (37.3 C) (Oral)   Resp 22   Ht 5\' 2"  (1.575 m)   Wt 53.1 kg   SpO2 91%   BMI 21.40 kg/m   Physical Exam  Constitutional: She is oriented to person, place, and time. She appears well-developed and well-nourished. No distress.  HENT:  Head: Normocephalic.  Right Ear: Tympanic membrane normal.  Left Ear: Tympanic membrane normal.  Nose: Rhinorrhea present.  Mouth/Throat: Uvula is midline, oropharynx is clear and moist and mucous membranes are normal.  Eyes: Conjunctivae and EOM are normal. Pupils are equal, round, and reactive to light.  Neck: Normal range of motion. Neck supple.  Cardiovascular: Regular rhythm.  Tachycardia present.   Pulmonary/Chest: No respiratory distress. She has wheezes. She has rales in the right middle field and the right lower field. She exhibits tenderness.  Abdominal: Soft. Bowel sounds are normal. There is no tenderness.    Musculoskeletal: Normal range of motion.  Neurological: She is alert and oriented to person, place, and time. No cranial nerve deficit.  Skin: Skin is warm and dry.  Psychiatric: She has a normal mood and affect. Her behavior is normal.  Nursing note and vitals reviewed.    ED Treatments / Results  Labs (all labs ordered are listed, but only abnormal results are displayed) Labs Reviewed - No data to display  Radiology Dg Chest 2 View  Result Date: 02/21/2016 CLINICAL DATA:  Sore throat and cough EXAM: CHEST  2 VIEW COMPARISON:  10/30/2014 FINDINGS: The lungs are hyperinflated. There is a right middle lobe consolidation. No pleural effusion. Streaky lingular opacity as before. Normal heart size. No pneumothorax. IMPRESSION: Streaky lingular opacity. Partial consolidation in the right middle lobe suspicious for pneumonia. Radiographic follow-up to resolution is advised. Electronically Signed   By: Adrian Prows.D.  On: 02/21/2016 18:09    Procedures Procedures (including critical care time)  Medications Ordered in ED Medications  ipratropium-albuterol (DUONEB) 0.5-2.5 (3) MG/3ML nebulizer solution 3 mL (3 mLs Nebulization Given 02/21/16 1916)     Initial Impression / Assessment and Plan / ED Course  I have reviewed the triage vital signs and the nursing notes.  Pertinent imaging results that were available during my care of the patient were reviewed by me and considered in my medical decision making (see chart for details).  Clinical Course   66 y.o. female with cough, congestion, fever and chills x 1 week stable for d/c without respiratory distress. Discussed this case with Dr. Hyacinth Meeker and he evaluated the patient as well. Will treat with Augmentin, prednisone, and cough medication. She will follow up with her PCP or return here for worsening symptoms.   Final Clinical Impressions(s) / ED Diagnoses   Final diagnoses:  Pneumonia of right middle lobe due to infectious organism  (HCC)  Bronchospasm    New Prescriptions New Prescriptions   ALBUTEROL (PROVENTIL) (5 MG/ML) 0.5% NEBULIZER SOLUTION    Take 0.5 mLs (2.5 mg total) by nebulization every 6 (six) hours as needed for wheezing or shortness of breath.   AMOXICILLIN-CLAVULANATE (AUGMENTIN) 875-125 MG TABLET    Take 1 tablet by mouth 2 (two) times daily.   GUAIFENESIN-CODEINE 100-10 MG/5ML SYRUP    Take 5 mLs by mouth every 6 (six) hours as needed for cough.   PREDNISONE (DELTASONE) 10 MG TABLET    Take 2 tablets (20 mg total) by mouth 2 (two) times daily with a meal.     Janne Napoleon, NP 02/21/16 1940  BP 137/59 (BP Location: Right Arm)   Pulse 111   Temp 99.1 F (37.3 C) (Oral)   Resp 20   Ht 5\' 2"  (1.575 m)   Wt 53.1 kg   SpO2 (!) 88%   BMI 21.40 kg/m   When ambulating the patient her O2 SAT dropped to 88%. I discussed with the patient inpatient treatment of her pneumonia due to her low O2 SAT. Patient states that she does not want to stay in the hospital and she will take her medication and use her nebulizer as needed and that she will return if her symptoms worsen. Patient's husband is with her and states that he will bring the patient back if her symptoms worsen.  I discussed this with Dr. Hyacinth Meeker as well.    [place], NP 02/21/16 2003    Eber Hong, MD 02/21/16 [phone]

## 2016-02-21 NOTE — ED Notes (Signed)
Sick with URI symptoms x 1 week. Coughing up yellowish expectorant

## 2016-02-21 NOTE — ED Notes (Signed)
Discharge held due to pulse ox - HN in to discuss with pt

## 2016-02-21 NOTE — ED Notes (Signed)
Respiratory in for treatment 

## 2016-02-21 NOTE — Discharge Instructions (Signed)
Call your doctor to schedule a follow up appointment. Return here for worsening symptoms

## 2016-02-22 DIAGNOSIS — R69 Illness, unspecified: Secondary | ICD-10-CM | POA: Diagnosis not present

## 2016-03-02 DIAGNOSIS — I10 Essential (primary) hypertension: Secondary | ICD-10-CM | POA: Diagnosis not present

## 2016-03-02 DIAGNOSIS — R69 Illness, unspecified: Secondary | ICD-10-CM | POA: Diagnosis not present

## 2016-03-02 DIAGNOSIS — J189 Pneumonia, unspecified organism: Secondary | ICD-10-CM | POA: Diagnosis not present

## 2016-03-16 ENCOUNTER — Encounter: Payer: Self-pay | Admitting: Nurse Practitioner

## 2016-03-16 ENCOUNTER — Ambulatory Visit (INDEPENDENT_AMBULATORY_CARE_PROVIDER_SITE_OTHER): Payer: Medicare HMO | Admitting: Nurse Practitioner

## 2016-03-16 VITALS — BP 119/71 | HR 105 | Temp 98.4°F | Ht 62.0 in | Wt 120.8 lb

## 2016-03-16 DIAGNOSIS — R1310 Dysphagia, unspecified: Secondary | ICD-10-CM | POA: Diagnosis not present

## 2016-03-16 DIAGNOSIS — R634 Abnormal weight loss: Secondary | ICD-10-CM | POA: Diagnosis not present

## 2016-03-16 DIAGNOSIS — A048 Other specified bacterial intestinal infections: Secondary | ICD-10-CM | POA: Diagnosis not present

## 2016-03-16 NOTE — Progress Notes (Signed)
Referring Provider: Wilson Singer, MD Primary Care Physician:  Wilson Singer, MD Primary GI:  Dr. Jena Gauss  Chief Complaint  Patient presents with  . TCS/EGD    proc f/u  . Dysphagia    at times with eating but better    HPI:   Karen Church is a 66 y.o. female who presents for follow-up on dysphagia, weight loss, fatigue. The patient was last seen in our office 11/20/2015 for the same as well as to schedule a screening colonoscopy. At that time she noted solid food dysphagia and regurgitation which started the month prior and unintentional weight loss of 20-25 pounds in the past year with eating what she wants but noted decreased appetite. Also noted decreased energy. Heme card at the health department the year prior was negative. Essentially normal bowel movements. Her previous colonoscopy was about 28 years prior. Follow-up colonoscopy was scheduled as well as upper endoscopy with possible dilation for dysphagia.  EGD completed 12/10/2015 which found normal esophagus status post dilation, congestive gastropathy status post biopsy. Surgical pathology as per below. Colonoscopy completed the same day found normal examined colon, no specimens collected. Recommended repeat colonoscopy in 10 years for screening. No need to repeat upper endoscopy. Return for follow-up in clinic in 12 weeks.   Attempted to treat her H. pylori with Prevpac, but insurance declined. She was treated with amoxicillin 1 g twice a day for 14 days, Biaxin 500 mg twice a day for 14 days, and lansoprazole 30 mg twice a day for 14 days.  Today she states she's doing well. Had a MUCH better experience with colonoscopy, doesn't remember much. She states she completed her antibiotics for H. Pylori. Denies abdominal pain, N/V, hematochezia, melena. Hs some dark stools but takes MVI likely with iron (she thinks). Reflux is much improved after H. Pylori treatment, rare breakthrough symptoms. Has some continued dysphagia  typically only with really dense food like meats and typically only with the first bite. Soft foods not bothersome. Denies chest pain, dyspnea, dizziness, lightheadedness, syncope, near syncope. Denies any other upper or lower GI symptoms.  Rarely takes Naproxen for arthritis.     Past Medical History:  Diagnosis Date  . Carpal tunnel syndrome   . COPD (chronic obstructive pulmonary disease) (HCC)     Past Surgical History:  Procedure Laterality Date  . ABDOMINAL HYSTERECTOMY    . BIOPSY  12/10/2015   Procedure: BIOPSY;  Surgeon: Corbin Ade, MD;  Location: [facility];  Service: Endoscopy;;  Gastric   . CESAREAN SECTION    . COLONOSCOPY N/A 12/10/2015   Procedure: COLONOSCOPY;  Surgeon: Corbin Ade, MD;  Location: [facility];  Service: Endoscopy;  Laterality: N/A;  1030  . ESOPHAGOGASTRODUODENOSCOPY N/A 12/10/2015   Procedure: ESOPHAGOGASTRODUODENOSCOPY (EGD);  Surgeon: Corbin Ade, MD;  Location: [facility];  Service: Endoscopy;  Laterality: N/A;  . [name] DILATION N/A 12/10/2015   Procedure: [name] DILATION;  Surgeon: Corbin Ade, MD;  Location: [facility];  Service: Endoscopy;  Laterality: N/A;  . SKIN CANCER EXCISION    . SMALL INTESTINE SURGERY N/A 1967   Resection    Current Outpatient Prescriptions  Medication Sig Dispense Refill  . albuterol (PROVENTIL) (5 MG/ML) 0.5% nebulizer solution Take 0.5 mLs (2.5 mg total) by nebulization every 6 (six) hours as needed for wheezing or shortness of breath. 20 mL 1  . amLODipine (NORVASC) 2.5 MG tablet Take 2.5 mg by mouth every evening.    Marland Kitchen  aspirin EC 81 MG tablet Take 81 mg by mouth every other day.    . Multiple Vitamin (MULTIVITAMIN WITH MINERALS) TABS Take 1 tablet by mouth daily.    . naproxen sodium (ANAPROX) 220 MG tablet Take 220-440 mg by mouth as needed (pain).      No current facility-administered medications for this visit.     Allergies as of 03/16/2016  . (No Known Allergies)     Family History  Problem Relation Age of Onset  . Colon polyps Sister   . Colon polyps Brother   . Colon cancer Neg Hx     Social History   Social History  . Marital status: Married    Spouse name: N/A  . Number of children: N/A  . Years of education: N/A   Social History Main Topics  . Smoking status: Current Every Day Smoker    Packs/day: 0.50    Types: Cigarettes  . Smokeless tobacco: Never Used     Comment: Trying to cut back  . Alcohol use No  . Drug use: No  . Sexual activity: Not Asked   Other Topics Concern  . None   Social History Narrative  . None    Review of Systems: General: Negative for anorexia, weight loss, fever, chills, fatigue, weakness. ENT: Negative for hoarseness, difficulty swallowing. CV: Negative for chest pain, angina, palpitations, peripheral edema.  Respiratory: Negative for dyspnea at rest, worsening cough, sputum, wheezing.  GI: See history of present illness. Endo: Negative for continued weight change.    Physical Exam: BP 119/71   Pulse (!) 105   Temp 98.4 F (36.9 C) (Oral)   Ht 5\' 2"  (1.575 m)   Wt 120 lb 12.8 oz (54.8 kg)   BMI 22.09 kg/m  General:   Alert and oriented. Pleasant and cooperative. Well-nourished and well-developed.  Ears:  Normal auditory acuity. Mouth:  Poor dentition with multiple teeth missing.  Throat/Neck:  Supple, without mass or thyromegaly. Cardiovascular:  S1, S2 present without murmurs appreciated. Extremities without clubbing or edema. Respiratory:  Clear to auscultation bilaterally. No wheezes, rales, or rhonchi. No distress.  Gastrointestinal:  +BS, soft, non-tender and non-distended. No HSM noted. No guarding or rebound. No masses appreciated.  Rectal:  Deferred  Musculoskalatal:  Symmetrical without gross deformities. Neurologic:  Alert and oriented x4;  grossly normal neurologically. Psych:  Alert and cooperative. Normal mood and affect. Heme/Lymph/Immune: No excessive bruising  noted.    03/16/2016 11:36 AM   Disclaimer: This note was dictated with voice recognition software. Similar sounding words can inadvertently be transcribed and may not be corrected upon review.

## 2016-03-16 NOTE — Assessment & Plan Note (Signed)
Weight has been stable over the previous 4 months. No further weight loss. Continue to monitor, return for follow-up as needed for any worsening symptoms, continued weight loss.

## 2016-03-16 NOTE — Progress Notes (Signed)
CC'ED TO PCP 

## 2016-03-16 NOTE — Assessment & Plan Note (Signed)
Confirmed H. pylori infection with gastric biopsy. Treated with antibiotics, states she took all of her medications as instructed. GERD/reflux/dyspepsia symptoms significantly improved, essentially resolved. Return for follow-up as needed, call for any worsening or recurrent symptoms.

## 2016-03-16 NOTE — Patient Instructions (Signed)
1. Continue taking her current medications. 2. We will notify you and at this time to repeat her colonoscopy in 10 years. 3. Return for follow-up as needed for any worsening symptoms or recurrent symptoms.

## 2016-03-16 NOTE — Assessment & Plan Note (Signed)
Status post upper endoscopy with dilation. Some minimal solid food dysphagia is residual, typically only with meats and only the first bite. Recommended she drink a half a glass of water prior to eating her meal as her food may simply be sticking. She also has poor dentition and is likely unable to chew appropriately. Soft foods do very well for her. Return for follow-up as needed for any worsening or recurrent symptoms.

## 2016-05-11 ENCOUNTER — Ambulatory Visit (HOSPITAL_COMMUNITY)
Admission: RE | Admit: 2016-05-11 | Discharge: 2016-05-11 | Disposition: A | Discharge status: Home / Self Care | Payer: Medicare HMO | Source: Ambulatory Visit | Attending: Internal Medicine | Admitting: Internal Medicine

## 2016-05-11 ENCOUNTER — Other Ambulatory Visit (HOSPITAL_COMMUNITY): Payer: Self-pay | Admitting: Internal Medicine

## 2016-05-11 DIAGNOSIS — R0602 Shortness of breath: Secondary | ICD-10-CM | POA: Diagnosis not present

## 2016-05-11 DIAGNOSIS — R05 Cough: Secondary | ICD-10-CM | POA: Diagnosis not present

## 2016-05-11 DIAGNOSIS — J189 Pneumonia, unspecified organism: Secondary | ICD-10-CM

## 2016-05-11 DIAGNOSIS — I10 Essential (primary) hypertension: Secondary | ICD-10-CM | POA: Diagnosis not present

## 2016-05-11 DIAGNOSIS — R5383 Other fatigue: Secondary | ICD-10-CM | POA: Diagnosis not present

## 2016-05-11 DIAGNOSIS — E559 Vitamin D deficiency, unspecified: Secondary | ICD-10-CM | POA: Diagnosis not present

## 2016-08-25 DIAGNOSIS — I10 Essential (primary) hypertension: Secondary | ICD-10-CM | POA: Diagnosis not present

## 2016-08-25 DIAGNOSIS — M159 Polyosteoarthritis, unspecified: Secondary | ICD-10-CM | POA: Diagnosis not present

## 2016-08-25 DIAGNOSIS — E785 Hyperlipidemia, unspecified: Secondary | ICD-10-CM | POA: Diagnosis not present

## 2016-09-29 DIAGNOSIS — R69 Illness, unspecified: Secondary | ICD-10-CM | POA: Diagnosis not present

## 2016-11-02 DIAGNOSIS — H25013 Cortical age-related cataract, bilateral: Secondary | ICD-10-CM | POA: Diagnosis not present

## 2016-11-02 DIAGNOSIS — H5200 Hypermetropia, unspecified eye: Secondary | ICD-10-CM | POA: Diagnosis not present

## 2016-11-02 DIAGNOSIS — Z0100 Encounter for examination of eyes and vision without abnormal findings: Secondary | ICD-10-CM | POA: Diagnosis not present

## 2016-11-09 DIAGNOSIS — E559 Vitamin D deficiency, unspecified: Secondary | ICD-10-CM | POA: Diagnosis not present

## 2016-11-09 DIAGNOSIS — E785 Hyperlipidemia, unspecified: Secondary | ICD-10-CM | POA: Diagnosis not present

## 2016-11-09 DIAGNOSIS — M159 Polyosteoarthritis, unspecified: Secondary | ICD-10-CM | POA: Diagnosis not present

## 2016-11-09 DIAGNOSIS — I10 Essential (primary) hypertension: Secondary | ICD-10-CM | POA: Diagnosis not present

## 2016-11-30 DIAGNOSIS — I10 Essential (primary) hypertension: Secondary | ICD-10-CM | POA: Diagnosis not present

## 2017-02-08 DIAGNOSIS — M7021 Olecranon bursitis, right elbow: Secondary | ICD-10-CM | POA: Diagnosis not present

## 2017-02-08 DIAGNOSIS — L0390 Cellulitis, unspecified: Secondary | ICD-10-CM | POA: Diagnosis not present

## 2017-03-22 DIAGNOSIS — I10 Essential (primary) hypertension: Secondary | ICD-10-CM | POA: Diagnosis not present

## 2017-03-22 DIAGNOSIS — R69 Illness, unspecified: Secondary | ICD-10-CM | POA: Diagnosis not present

## 2017-03-22 DIAGNOSIS — J449 Chronic obstructive pulmonary disease, unspecified: Secondary | ICD-10-CM | POA: Diagnosis not present

## 2017-03-22 DIAGNOSIS — Z6822 Body mass index (BMI) 22.0-22.9, adult: Secondary | ICD-10-CM | POA: Diagnosis not present

## 2017-03-30 DIAGNOSIS — Z6822 Body mass index (BMI) 22.0-22.9, adult: Secondary | ICD-10-CM | POA: Diagnosis not present

## 2017-03-30 DIAGNOSIS — J441 Chronic obstructive pulmonary disease with (acute) exacerbation: Secondary | ICD-10-CM | POA: Diagnosis not present

## 2017-04-02 DIAGNOSIS — E559 Vitamin D deficiency, unspecified: Secondary | ICD-10-CM | POA: Diagnosis not present

## 2017-04-02 DIAGNOSIS — R69 Illness, unspecified: Secondary | ICD-10-CM | POA: Diagnosis not present

## 2017-04-02 DIAGNOSIS — J449 Chronic obstructive pulmonary disease, unspecified: Secondary | ICD-10-CM | POA: Diagnosis not present

## 2017-04-02 DIAGNOSIS — Z0000 Encounter for general adult medical examination without abnormal findings: Secondary | ICD-10-CM | POA: Diagnosis not present

## 2017-04-02 DIAGNOSIS — I10 Essential (primary) hypertension: Secondary | ICD-10-CM | POA: Diagnosis not present

## 2017-04-05 ENCOUNTER — Other Ambulatory Visit (HOSPITAL_COMMUNITY): Payer: Self-pay | Admitting: Internal Medicine

## 2017-04-05 DIAGNOSIS — Z1231 Encounter for screening mammogram for malignant neoplasm of breast: Secondary | ICD-10-CM

## 2017-04-07 DIAGNOSIS — J449 Chronic obstructive pulmonary disease, unspecified: Secondary | ICD-10-CM | POA: Diagnosis not present

## 2017-04-07 DIAGNOSIS — I10 Essential (primary) hypertension: Secondary | ICD-10-CM | POA: Diagnosis not present

## 2017-04-07 DIAGNOSIS — R69 Illness, unspecified: Secondary | ICD-10-CM | POA: Diagnosis not present

## 2017-04-12 ENCOUNTER — Ambulatory Visit (HOSPITAL_COMMUNITY)
Admission: RE | Admit: 2017-04-12 | Discharge: 2017-04-12 | Disposition: A | Discharge status: Home / Self Care | Payer: Medicare HMO | Source: Ambulatory Visit | Attending: Internal Medicine | Admitting: Internal Medicine

## 2017-04-12 ENCOUNTER — Encounter (HOSPITAL_COMMUNITY): Payer: Self-pay

## 2017-04-12 DIAGNOSIS — Z1231 Encounter for screening mammogram for malignant neoplasm of breast: Secondary | ICD-10-CM | POA: Insufficient documentation

## 2017-04-16 DIAGNOSIS — R05 Cough: Secondary | ICD-10-CM | POA: Diagnosis not present

## 2017-04-16 DIAGNOSIS — J441 Chronic obstructive pulmonary disease with (acute) exacerbation: Secondary | ICD-10-CM | POA: Diagnosis not present

## 2018-03-14 ENCOUNTER — Ambulatory Visit (HOSPITAL_COMMUNITY)
Admission: RE | Admit: 2018-03-14 | Discharge: 2018-03-14 | Disposition: A | Discharge status: Home / Self Care | Payer: Medicare HMO | Source: Ambulatory Visit | Attending: Nurse Practitioner | Admitting: Nurse Practitioner

## 2018-03-14 ENCOUNTER — Other Ambulatory Visit (HOSPITAL_COMMUNITY): Payer: Self-pay | Admitting: Nurse Practitioner

## 2018-03-14 DIAGNOSIS — J060 Acute laryngopharyngitis: Secondary | ICD-10-CM | POA: Diagnosis not present

## 2018-03-14 DIAGNOSIS — R0602 Shortness of breath: Secondary | ICD-10-CM

## 2018-03-14 DIAGNOSIS — R059 Cough, unspecified: Secondary | ICD-10-CM

## 2018-03-14 DIAGNOSIS — J441 Chronic obstructive pulmonary disease with (acute) exacerbation: Secondary | ICD-10-CM | POA: Diagnosis not present

## 2018-03-14 DIAGNOSIS — R05 Cough: Secondary | ICD-10-CM | POA: Insufficient documentation

## 2018-04-12 ENCOUNTER — Ambulatory Visit (HOSPITAL_COMMUNITY)
Admission: RE | Admit: 2018-04-12 | Discharge: 2018-04-12 | Disposition: A | Discharge status: Home / Self Care | Payer: Medicare HMO | Source: Ambulatory Visit | Attending: Adult Health Nurse Practitioner | Admitting: Adult Health Nurse Practitioner

## 2018-04-12 ENCOUNTER — Other Ambulatory Visit (HOSPITAL_COMMUNITY): Payer: Self-pay | Admitting: Adult Health Nurse Practitioner

## 2018-04-12 DIAGNOSIS — R079 Chest pain, unspecified: Secondary | ICD-10-CM | POA: Diagnosis not present

## 2018-04-12 DIAGNOSIS — J189 Pneumonia, unspecified organism: Secondary | ICD-10-CM | POA: Insufficient documentation

## 2018-04-12 DIAGNOSIS — I10 Essential (primary) hypertension: Secondary | ICD-10-CM | POA: Diagnosis not present

## 2018-04-12 DIAGNOSIS — R05 Cough: Secondary | ICD-10-CM | POA: Diagnosis not present

## 2018-04-12 DIAGNOSIS — E559 Vitamin D deficiency, unspecified: Secondary | ICD-10-CM | POA: Diagnosis not present

## 2018-04-12 DIAGNOSIS — H5200 Hypermetropia, unspecified eye: Secondary | ICD-10-CM | POA: Diagnosis not present

## 2018-04-12 DIAGNOSIS — Z6822 Body mass index (BMI) 22.0-22.9, adult: Secondary | ICD-10-CM | POA: Diagnosis not present

## 2018-04-12 DIAGNOSIS — H25013 Cortical age-related cataract, bilateral: Secondary | ICD-10-CM | POA: Diagnosis not present

## 2018-04-15 DIAGNOSIS — Z0000 Encounter for general adult medical examination without abnormal findings: Secondary | ICD-10-CM | POA: Diagnosis not present

## 2018-04-15 DIAGNOSIS — I10 Essential (primary) hypertension: Secondary | ICD-10-CM | POA: Diagnosis not present

## 2018-04-15 DIAGNOSIS — Z6822 Body mass index (BMI) 22.0-22.9, adult: Secondary | ICD-10-CM | POA: Diagnosis not present

## 2018-04-15 DIAGNOSIS — J449 Chronic obstructive pulmonary disease, unspecified: Secondary | ICD-10-CM | POA: Diagnosis not present

## 2018-04-15 DIAGNOSIS — E782 Mixed hyperlipidemia: Secondary | ICD-10-CM | POA: Diagnosis not present

## 2018-04-15 DIAGNOSIS — R69 Illness, unspecified: Secondary | ICD-10-CM | POA: Diagnosis not present

## 2018-04-15 DIAGNOSIS — E559 Vitamin D deficiency, unspecified: Secondary | ICD-10-CM | POA: Diagnosis not present

## 2018-04-15 DIAGNOSIS — Z716 Tobacco abuse counseling: Secondary | ICD-10-CM | POA: Diagnosis not present

## 2018-04-15 DIAGNOSIS — B029 Zoster without complications: Secondary | ICD-10-CM | POA: Diagnosis not present

## 2018-08-26 DIAGNOSIS — R0602 Shortness of breath: Secondary | ICD-10-CM | POA: Diagnosis not present

## 2018-08-26 DIAGNOSIS — R05 Cough: Secondary | ICD-10-CM | POA: Diagnosis not present

## 2018-09-13 ENCOUNTER — Ambulatory Visit (HOSPITAL_COMMUNITY)
Admission: RE | Admit: 2018-09-13 | Discharge: 2018-09-13 | Disposition: A | Discharge status: Home / Self Care | Payer: Medicare HMO | Source: Ambulatory Visit | Attending: Family Medicine | Admitting: Family Medicine

## 2018-09-13 ENCOUNTER — Other Ambulatory Visit: Payer: Self-pay

## 2018-09-13 ENCOUNTER — Other Ambulatory Visit (HOSPITAL_COMMUNITY): Payer: Self-pay | Admitting: Family Medicine

## 2018-09-13 DIAGNOSIS — R059 Cough, unspecified: Secondary | ICD-10-CM

## 2018-09-13 DIAGNOSIS — R5381 Other malaise: Secondary | ICD-10-CM | POA: Diagnosis not present

## 2018-09-13 DIAGNOSIS — R5383 Other fatigue: Secondary | ICD-10-CM | POA: Diagnosis not present

## 2018-09-13 DIAGNOSIS — J060 Acute laryngopharyngitis: Secondary | ICD-10-CM | POA: Diagnosis not present

## 2018-09-13 DIAGNOSIS — Z6822 Body mass index (BMI) 22.0-22.9, adult: Secondary | ICD-10-CM | POA: Diagnosis not present

## 2018-09-13 DIAGNOSIS — R05 Cough: Secondary | ICD-10-CM

## 2018-09-13 DIAGNOSIS — E782 Mixed hyperlipidemia: Secondary | ICD-10-CM | POA: Diagnosis not present

## 2018-09-13 DIAGNOSIS — R0602 Shortness of breath: Secondary | ICD-10-CM

## 2018-09-13 DIAGNOSIS — Z716 Tobacco abuse counseling: Secondary | ICD-10-CM | POA: Diagnosis not present

## 2018-09-13 DIAGNOSIS — J449 Chronic obstructive pulmonary disease, unspecified: Secondary | ICD-10-CM | POA: Diagnosis not present

## 2018-09-13 DIAGNOSIS — Z0000 Encounter for general adult medical examination without abnormal findings: Secondary | ICD-10-CM | POA: Diagnosis not present

## 2018-09-13 DIAGNOSIS — B029 Zoster without complications: Secondary | ICD-10-CM | POA: Diagnosis not present

## 2018-09-13 DIAGNOSIS — I10 Essential (primary) hypertension: Secondary | ICD-10-CM | POA: Diagnosis not present

## 2018-09-13 DIAGNOSIS — R69 Illness, unspecified: Secondary | ICD-10-CM | POA: Diagnosis not present

## 2018-09-13 DIAGNOSIS — J189 Pneumonia, unspecified organism: Secondary | ICD-10-CM | POA: Diagnosis not present

## 2018-09-13 DIAGNOSIS — J441 Chronic obstructive pulmonary disease with (acute) exacerbation: Secondary | ICD-10-CM | POA: Diagnosis not present

## 2018-09-13 DIAGNOSIS — R509 Fever, unspecified: Secondary | ICD-10-CM | POA: Diagnosis not present

## 2018-09-14 ENCOUNTER — Emergency Department (HOSPITAL_COMMUNITY)
Admission: EM | Admit: 2018-09-14 | Discharge: 2018-09-14 | Disposition: A | Discharge status: Home / Self Care | Payer: Medicare HMO | Attending: Emergency Medicine | Admitting: Emergency Medicine

## 2018-09-14 ENCOUNTER — Other Ambulatory Visit: Payer: Self-pay

## 2018-09-14 ENCOUNTER — Encounter (HOSPITAL_COMMUNITY): Payer: Self-pay | Admitting: Emergency Medicine

## 2018-09-14 DIAGNOSIS — J449 Chronic obstructive pulmonary disease, unspecified: Secondary | ICD-10-CM | POA: Diagnosis not present

## 2018-09-14 DIAGNOSIS — T7840XA Allergy, unspecified, initial encounter: Secondary | ICD-10-CM | POA: Diagnosis not present

## 2018-09-14 DIAGNOSIS — Z79899 Other long term (current) drug therapy: Secondary | ICD-10-CM | POA: Diagnosis not present

## 2018-09-14 DIAGNOSIS — T50905A Adverse effect of unspecified drugs, medicaments and biological substances, initial encounter: Secondary | ICD-10-CM | POA: Diagnosis not present

## 2018-09-14 DIAGNOSIS — T783XXA Angioneurotic edema, initial encounter: Secondary | ICD-10-CM | POA: Diagnosis present

## 2018-09-14 DIAGNOSIS — Z7982 Long term (current) use of aspirin: Secondary | ICD-10-CM | POA: Diagnosis not present

## 2018-09-14 DIAGNOSIS — R69 Illness, unspecified: Secondary | ICD-10-CM | POA: Diagnosis not present

## 2018-09-14 DIAGNOSIS — F17210 Nicotine dependence, cigarettes, uncomplicated: Secondary | ICD-10-CM | POA: Diagnosis not present

## 2018-09-14 MED ORDER — FAMOTIDINE IN NACL 20-0.9 MG/50ML-% IV SOLN
20.0000 mg | Freq: Once | INTRAVENOUS | Status: AC
  Administered 2018-09-14: 20 mg via INTRAVENOUS
  Filled 2018-09-14: qty 50

## 2018-09-14 MED ORDER — DOXYCYCLINE HYCLATE 100 MG PO CAPS
100.0000 mg | ORAL_CAPSULE | Freq: Two times a day (BID) | ORAL | 0 refills | Status: DC
Start: 2018-09-14 — End: 2019-12-26

## 2018-09-14 MED ORDER — METHYLPREDNISOLONE SODIUM SUCC 125 MG IJ SOLR
125.0000 mg | Freq: Once | INTRAMUSCULAR | Status: AC
  Administered 2018-09-14: 19:00:00 125 mg via INTRAVENOUS
  Filled 2018-09-14: qty 2

## 2018-09-14 MED ORDER — PREDNISONE 20 MG PO TABS: ORAL_TABLET | ORAL | 0 refills | Status: DC

## 2018-09-14 MED ORDER — FAMOTIDINE 20 MG PO TABS: 20.0000 mg | ORAL_TABLET | Freq: Two times a day (BID) | ORAL | 0 refills | Status: DC

## 2018-09-14 MED ORDER — DIPHENHYDRAMINE HCL 50 MG/ML IJ SOLN
25.0000 mg | Freq: Once | INTRAMUSCULAR | Status: AC
  Administered 2018-09-14: 25 mg via INTRAVENOUS
  Filled 2018-09-14: qty 1

## 2018-09-14 NOTE — ED Provider Notes (Signed)
[facility] EMERGENCY DEPARTMENT Provider Note   CSN: [id number] Arrival date & time: 09/14/18  1506     History   Chief Complaint Chief Complaint  Patient presents with  . Angioedema  . Allergic Reaction    HPI Karen Church is a 68 y.o. female.     Patient complains of swelling to her upper lip after taking Lasix and Levaquin today.  She started both medicines today..  Patient is taking the Levaquin for pneumonia  The history is provided by the patient. No language interpreter was used.  Allergic Reaction Presenting symptoms: swelling   Presenting symptoms: no rash   Severity:  Moderate Prior allergic episodes:  No prior episodes Context: not animal exposure   Relieved by:  Nothing Worsened by:  Nothing Ineffective treatments:  None tried   Past Medical History:  Diagnosis Date  . Carpal tunnel syndrome   . COPD (chronic obstructive pulmonary disease) [facility])     Patient Active Problem List   Diagnosis Date Noted  . H. pylori infection 03/16/2016  . Loss of weight 11/20/2015  . Dysphagia 11/20/2015  . Fatigue 11/20/2015  . Encounter for screening colonoscopy 11/20/2015    Past Surgical History:  Procedure Laterality Date  . ABDOMINAL HYSTERECTOMY    . BIOPSY  12/10/2015   Procedure: BIOPSY;  Surgeon: Daneil Dolin, MD;  Location: [facility];  Service: Endoscopy;;  Gastric   . CESAREAN SECTION    . COLONOSCOPY N/A 12/10/2015   Procedure: COLONOSCOPY;  Surgeon: Daneil Dolin, MD;  Location: [facility];  Service: Endoscopy;  Laterality: N/A;  1030  . ESOPHAGOGASTRODUODENOSCOPY N/A 12/10/2015   Procedure: ESOPHAGOGASTRODUODENOSCOPY (EGD);  Surgeon: Daneil Dolin, MD;  Location: [facility];  Service: Endoscopy;  Laterality: N/A;  . Venia Minks DILATION N/A 12/10/2015   Procedure: Venia Minks DILATION;  Surgeon: Daneil Dolin, MD;  Location: [facility];  Service: Endoscopy;  Laterality: N/A;  . SKIN CANCER EXCISION    . SMALL INTESTINE SURGERY N/A  1967   Resection     OB History   No obstetric history on file.      Home Medications    Prior to Admission medications   Medication Sig Start Date End Date Taking? Authorizing Provider  albuterol (PROVENTIL) (5 MG/ML) 0.5% nebulizer solution Take 0.5 mLs (2.5 mg total) by nebulization every 6 (six) hours as needed for wheezing or shortness of breath. 02/21/16  Yes Neese, [place], NP  ANORO ELLIPTA 62.5-25 MCG/INH AEPB Inhale 1 puff into the lungs every morning.  09/14/18  Yes [provider]  aspirin EC 81 MG tablet Take 81 mg by mouth daily.    Yes [provider]  furosemide (LASIX) 20 MG tablet Take 20 mg by mouth daily. 09/13/18  Yes [provider]  levofloxacin (LEVAQUIN) 750 MG tablet Take 750 mg by mouth 2 (two) times daily. for 7 days starting on 09/13/2018 09/13/18  Yes [provider]  Multiple Vitamin (MULTIVITAMIN WITH MINERALS) TABS Take 1 tablet by mouth daily.   Yes [provider]  naproxen sodium (ANAPROX) 220 MG tablet Take 220-440 mg by mouth daily as needed (pain).    Yes [provider]  doxycycline (VIBRAMYCIN) 100 MG capsule Take 1 capsule (100 mg total) by mouth 2 (two) times daily. One po bid x 7 days 09/14/18   Milton Ferguson, MD  famotidine (PEPCID) 20 MG tablet Take 1 tablet (20 mg total) by mouth 2 (two) times daily. 09/14/18  Bethann BerkshireZammit, Leshia Kope, MD  predniSONE (DELTASONE) 20 MG tablet 2 tabs po daily x 3 days 09/14/18   Bethann BerkshireZammit, Athenia Rys, MD    Family History Family History  Problem Relation Age of Onset  . Colon polyps Sister   . Colon polyps Brother   . Colon cancer Neg Hx     Social History Social History   Tobacco Use  . Smoking status: Current Every Day Smoker    Packs/day: 0.50    Types: Cigarettes  . Smokeless tobacco: Never Used  . Tobacco comment: Trying to cut back  Substance Use Topics  . Alcohol use: No  . Drug use: No     Allergies   Lasix [furosemide]   Review of Systems Review of  Systems  Constitutional: Negative for appetite change and fatigue.  HENT: Negative for congestion, ear discharge and sinus pressure.        Swelling to upper right lip  Eyes: Negative for discharge.  Respiratory: Negative for cough.   Cardiovascular: Negative for chest pain.  Gastrointestinal: Negative for abdominal pain and diarrhea.  Genitourinary: Negative for frequency and hematuria.  Musculoskeletal: Negative for back pain.  Skin: Negative for rash.  Neurological: Negative for seizures and headaches.  Psychiatric/Behavioral: Negative for hallucinations.     Physical Exam Updated Vital Signs BP (!) 132/51   Pulse 90   Temp 98.4 F (36.9 C) (Oral)   Resp 18   Ht 5\' 2"  (1.575 m)   Wt 54.4 kg   SpO2 93%   BMI 21.95 kg/m   Physical Exam Vitals signs and nursing note reviewed.  Constitutional:      Appearance: She is well-developed.  HENT:     Head: Normocephalic.     Nose: Nose normal.     Mouth/Throat:     Comments: Patient has moderate swelling to the right side of her upper right lip.  Oropharynx otherwise is negative Eyes:     General: No scleral icterus.    Conjunctiva/sclera: Conjunctivae normal.  Neck:     Musculoskeletal: Neck supple.     Thyroid: No thyromegaly.  Cardiovascular:     Rate and Rhythm: Normal rate and regular rhythm.     Heart sounds: No murmur. No friction rub. No gallop.   Pulmonary:     Breath sounds: No stridor. No wheezing or rales.  Chest:     Chest wall: No tenderness.  Abdominal:     General: There is no distension.     Tenderness: There is no abdominal tenderness. There is no rebound.  Musculoskeletal: Normal range of motion.  Lymphadenopathy:     Cervical: No cervical adenopathy.  Skin:    Findings: No erythema or rash.  Neurological:     Mental Status: She is oriented to person, place, and time.     Motor: No abnormal muscle tone.     Coordination: Coordination normal.  Psychiatric:        Behavior: Behavior normal.       ED Treatments / Results  Labs (all labs ordered are listed, but only abnormal results are displayed) Labs Reviewed - No data to display  EKG None  Radiology Dg Chest 2 View  Result Date: 09/13/2018 CLINICAL DATA:  Cough, shortness of breath, congestion. History of pneumonia EXAM: CHEST - 2 VIEW COMPARISON:  Radiograph 04/11/2018, 03/14/2018 FINDINGS: Airways thickening with patchy and streaky opacities in the right lower lobe with collapse of the right middle lobe. Remaining portions of the lungs are clear. Trace right effusion. No  pneumothorax. Cardiomediastinal contours are unremarkable. No acute osseous or soft tissue abnormality. IMPRESSION: Right lower lobe pneumonia with collapse of the right middle lobe likely with underlying consolidation and small right pleural effusion. Given recurrent collapse of the right middle lobe on multiple prior exams, findings raise suspicion for possible right middle lobe syndrome including atypical infectious etiologies. Electronically Signed   By: Kreg ShropshirePrice  DeHay M.D.   On: 09/13/2018 16:53    Procedures Procedures (including critical care time)  Medications Ordered in ED Medications  diphenhydrAMINE (BENADRYL) injection 25 mg (25 mg Intravenous Given 09/14/18 1920)  famotidine (PEPCID) IVPB 20 mg premix (0 mg Intravenous Stopped 09/14/18 1955)  methylPREDNISolone sodium succinate (SOLU-MEDROL) 125 mg/2 mL injection 125 mg (125 mg Intravenous Given 09/14/18 1920)     Initial Impression / Assessment and Plan / ED Course  I have reviewed the triage vital signs and the nursing notes.  Pertinent labs & imaging results that were available during my care of the patient were reviewed by me and considered in my medical decision making (see chart for details).        Patient is allergic reaction improved with Pepcid Solu-Medrol and Benadryl.  She will be sent home with prednisone and Pepcid and told to take Benadryl and she will stop the Levaquin and Lasix  and start doxycycline.  She will follow-up with her PCP this week Final Clinical Impressions(s) / ED Diagnoses   Final diagnoses:  Allergic reaction, initial encounter    ED Discharge Orders         Ordered    predniSONE (DELTASONE) 20 MG tablet     09/14/18 2042    famotidine (PEPCID) 20 MG tablet  2 times daily     09/14/18 2042    doxycycline (VIBRAMYCIN) 100 MG capsule  2 times daily     09/14/18 2042           Bethann BerkshireZammit, Breda Bond, MD 09/14/18 2046

## 2018-09-14 NOTE — Discharge Instructions (Addendum)
Stop taking your Lasix and Levaquin.  Take Benadryl 25 mg every 6 hours for swelling.  Follow-up with your doctor in 1 to 2 days.  Return sooner if getting worse

## 2018-09-14 NOTE — ED Triage Notes (Signed)
Pt states that she has swelling in her lips she is not on an ACE inhibitor. She just start lasix.

## 2018-09-20 DIAGNOSIS — T782XXA Anaphylactic shock, unspecified, initial encounter: Secondary | ICD-10-CM | POA: Diagnosis not present

## 2018-09-20 DIAGNOSIS — J441 Chronic obstructive pulmonary disease with (acute) exacerbation: Secondary | ICD-10-CM | POA: Diagnosis not present

## 2018-10-24 DIAGNOSIS — E559 Vitamin D deficiency, unspecified: Secondary | ICD-10-CM | POA: Diagnosis not present

## 2018-10-24 DIAGNOSIS — E782 Mixed hyperlipidemia: Secondary | ICD-10-CM | POA: Diagnosis not present

## 2018-10-24 DIAGNOSIS — I10 Essential (primary) hypertension: Secondary | ICD-10-CM | POA: Diagnosis not present

## 2018-11-01 ENCOUNTER — Other Ambulatory Visit (HOSPITAL_COMMUNITY): Payer: Self-pay | Admitting: Internal Medicine

## 2018-11-01 DIAGNOSIS — Z1231 Encounter for screening mammogram for malignant neoplasm of breast: Secondary | ICD-10-CM

## 2018-11-02 DIAGNOSIS — E782 Mixed hyperlipidemia: Secondary | ICD-10-CM | POA: Diagnosis not present

## 2018-11-02 DIAGNOSIS — I10 Essential (primary) hypertension: Secondary | ICD-10-CM | POA: Diagnosis not present

## 2018-11-02 DIAGNOSIS — R5381 Other malaise: Secondary | ICD-10-CM | POA: Diagnosis not present

## 2018-11-02 DIAGNOSIS — R079 Chest pain, unspecified: Secondary | ICD-10-CM | POA: Diagnosis not present

## 2018-11-02 DIAGNOSIS — R69 Illness, unspecified: Secondary | ICD-10-CM | POA: Diagnosis not present

## 2018-11-02 DIAGNOSIS — Z6822 Body mass index (BMI) 22.0-22.9, adult: Secondary | ICD-10-CM | POA: Diagnosis not present

## 2018-11-02 DIAGNOSIS — Z0000 Encounter for general adult medical examination without abnormal findings: Secondary | ICD-10-CM | POA: Diagnosis not present

## 2018-11-02 DIAGNOSIS — Z136 Encounter for screening for cardiovascular disorders: Secondary | ICD-10-CM | POA: Diagnosis not present

## 2018-11-02 DIAGNOSIS — E559 Vitamin D deficiency, unspecified: Secondary | ICD-10-CM | POA: Diagnosis not present

## 2018-11-02 DIAGNOSIS — R5383 Other fatigue: Secondary | ICD-10-CM | POA: Diagnosis not present

## 2018-11-02 DIAGNOSIS — R509 Fever, unspecified: Secondary | ICD-10-CM | POA: Diagnosis not present

## 2018-11-02 DIAGNOSIS — B029 Zoster without complications: Secondary | ICD-10-CM | POA: Diagnosis not present

## 2018-11-02 DIAGNOSIS — Z716 Tobacco abuse counseling: Secondary | ICD-10-CM | POA: Diagnosis not present

## 2018-11-02 DIAGNOSIS — J060 Acute laryngopharyngitis: Secondary | ICD-10-CM | POA: Diagnosis not present

## 2018-11-02 DIAGNOSIS — J449 Chronic obstructive pulmonary disease, unspecified: Secondary | ICD-10-CM | POA: Diagnosis not present

## 2018-11-04 ENCOUNTER — Ambulatory Visit (HOSPITAL_COMMUNITY)
Admission: RE | Admit: 2018-11-04 | Discharge: 2018-11-04 | Disposition: A | Discharge status: Home / Self Care | Payer: Medicare HMO | Source: Ambulatory Visit | Attending: Internal Medicine | Admitting: Internal Medicine

## 2018-11-04 ENCOUNTER — Other Ambulatory Visit: Payer: Self-pay

## 2018-11-04 DIAGNOSIS — Z1231 Encounter for screening mammogram for malignant neoplasm of breast: Secondary | ICD-10-CM | POA: Diagnosis not present

## 2018-11-17 DIAGNOSIS — I10 Essential (primary) hypertension: Secondary | ICD-10-CM | POA: Diagnosis not present

## 2018-11-17 DIAGNOSIS — J449 Chronic obstructive pulmonary disease, unspecified: Secondary | ICD-10-CM | POA: Diagnosis not present

## 2018-11-17 DIAGNOSIS — J441 Chronic obstructive pulmonary disease with (acute) exacerbation: Secondary | ICD-10-CM | POA: Diagnosis not present

## 2018-11-17 DIAGNOSIS — R69 Illness, unspecified: Secondary | ICD-10-CM | POA: Diagnosis not present

## 2019-01-09 DIAGNOSIS — I10 Essential (primary) hypertension: Secondary | ICD-10-CM | POA: Diagnosis not present

## 2019-01-09 DIAGNOSIS — J441 Chronic obstructive pulmonary disease with (acute) exacerbation: Secondary | ICD-10-CM | POA: Diagnosis not present

## 2019-01-09 DIAGNOSIS — J449 Chronic obstructive pulmonary disease, unspecified: Secondary | ICD-10-CM | POA: Diagnosis not present

## 2019-01-09 DIAGNOSIS — R69 Illness, unspecified: Secondary | ICD-10-CM | POA: Diagnosis not present

## 2019-04-28 DIAGNOSIS — Z0000 Encounter for general adult medical examination without abnormal findings: Secondary | ICD-10-CM | POA: Diagnosis not present

## 2019-04-28 DIAGNOSIS — B029 Zoster without complications: Secondary | ICD-10-CM | POA: Diagnosis not present

## 2019-04-28 DIAGNOSIS — Z716 Tobacco abuse counseling: Secondary | ICD-10-CM | POA: Diagnosis not present

## 2019-04-28 DIAGNOSIS — J449 Chronic obstructive pulmonary disease, unspecified: Secondary | ICD-10-CM | POA: Diagnosis not present

## 2019-04-28 DIAGNOSIS — J060 Acute laryngopharyngitis: Secondary | ICD-10-CM | POA: Diagnosis not present

## 2019-04-28 DIAGNOSIS — R5383 Other fatigue: Secondary | ICD-10-CM | POA: Diagnosis not present

## 2019-04-28 DIAGNOSIS — R5381 Other malaise: Secondary | ICD-10-CM | POA: Diagnosis not present

## 2019-04-28 DIAGNOSIS — E782 Mixed hyperlipidemia: Secondary | ICD-10-CM | POA: Diagnosis not present

## 2019-04-28 DIAGNOSIS — R509 Fever, unspecified: Secondary | ICD-10-CM | POA: Diagnosis not present

## 2019-04-28 DIAGNOSIS — R69 Illness, unspecified: Secondary | ICD-10-CM | POA: Diagnosis not present

## 2019-05-03 DIAGNOSIS — Z0001 Encounter for general adult medical examination with abnormal findings: Secondary | ICD-10-CM | POA: Diagnosis not present

## 2019-05-03 DIAGNOSIS — Z23 Encounter for immunization: Secondary | ICD-10-CM | POA: Diagnosis not present

## 2019-05-03 DIAGNOSIS — E559 Vitamin D deficiency, unspecified: Secondary | ICD-10-CM | POA: Diagnosis not present

## 2019-05-03 DIAGNOSIS — M25562 Pain in left knee: Secondary | ICD-10-CM | POA: Diagnosis not present

## 2019-05-03 DIAGNOSIS — I10 Essential (primary) hypertension: Secondary | ICD-10-CM | POA: Diagnosis not present

## 2019-05-03 DIAGNOSIS — J449 Chronic obstructive pulmonary disease, unspecified: Secondary | ICD-10-CM | POA: Diagnosis not present

## 2019-05-03 DIAGNOSIS — R944 Abnormal results of kidney function studies: Secondary | ICD-10-CM | POA: Diagnosis not present

## 2019-05-03 DIAGNOSIS — J441 Chronic obstructive pulmonary disease with (acute) exacerbation: Secondary | ICD-10-CM | POA: Diagnosis not present

## 2019-05-03 DIAGNOSIS — R69 Illness, unspecified: Secondary | ICD-10-CM | POA: Diagnosis not present

## 2019-05-03 DIAGNOSIS — Z716 Tobacco abuse counseling: Secondary | ICD-10-CM | POA: Diagnosis not present

## 2019-05-03 DIAGNOSIS — Z6822 Body mass index (BMI) 22.0-22.9, adult: Secondary | ICD-10-CM | POA: Diagnosis not present

## 2019-05-03 DIAGNOSIS — E782 Mixed hyperlipidemia: Secondary | ICD-10-CM | POA: Diagnosis not present

## 2019-10-09 ENCOUNTER — Other Ambulatory Visit (HOSPITAL_COMMUNITY): Payer: Self-pay | Admitting: Internal Medicine

## 2019-10-09 DIAGNOSIS — Z1231 Encounter for screening mammogram for malignant neoplasm of breast: Secondary | ICD-10-CM

## 2019-11-01 DIAGNOSIS — E782 Mixed hyperlipidemia: Secondary | ICD-10-CM | POA: Diagnosis not present

## 2019-11-01 DIAGNOSIS — E559 Vitamin D deficiency, unspecified: Secondary | ICD-10-CM | POA: Diagnosis not present

## 2019-11-01 DIAGNOSIS — Z716 Tobacco abuse counseling: Secondary | ICD-10-CM | POA: Diagnosis not present

## 2019-11-01 DIAGNOSIS — Z23 Encounter for immunization: Secondary | ICD-10-CM | POA: Diagnosis not present

## 2019-11-01 DIAGNOSIS — J060 Acute laryngopharyngitis: Secondary | ICD-10-CM | POA: Diagnosis not present

## 2019-11-01 DIAGNOSIS — R69 Illness, unspecified: Secondary | ICD-10-CM | POA: Diagnosis not present

## 2019-11-01 DIAGNOSIS — Z0000 Encounter for general adult medical examination without abnormal findings: Secondary | ICD-10-CM | POA: Diagnosis not present

## 2019-11-01 DIAGNOSIS — R5381 Other malaise: Secondary | ICD-10-CM | POA: Diagnosis not present

## 2019-11-01 DIAGNOSIS — R509 Fever, unspecified: Secondary | ICD-10-CM | POA: Diagnosis not present

## 2019-11-01 DIAGNOSIS — R5383 Other fatigue: Secondary | ICD-10-CM | POA: Diagnosis not present

## 2019-11-01 DIAGNOSIS — B029 Zoster without complications: Secondary | ICD-10-CM | POA: Diagnosis not present

## 2019-11-06 ENCOUNTER — Ambulatory Visit (HOSPITAL_COMMUNITY)
Admission: RE | Admit: 2019-11-06 | Discharge: 2019-11-06 | Disposition: A | Discharge status: Home / Self Care | Payer: Medicare HMO | Source: Ambulatory Visit | Attending: Internal Medicine | Admitting: Internal Medicine

## 2019-11-06 ENCOUNTER — Other Ambulatory Visit: Payer: Self-pay

## 2019-11-06 DIAGNOSIS — R944 Abnormal results of kidney function studies: Secondary | ICD-10-CM | POA: Diagnosis not present

## 2019-11-06 DIAGNOSIS — E559 Vitamin D deficiency, unspecified: Secondary | ICD-10-CM | POA: Diagnosis not present

## 2019-11-06 DIAGNOSIS — Z6822 Body mass index (BMI) 22.0-22.9, adult: Secondary | ICD-10-CM | POA: Diagnosis not present

## 2019-11-06 DIAGNOSIS — I10 Essential (primary) hypertension: Secondary | ICD-10-CM | POA: Diagnosis not present

## 2019-11-06 DIAGNOSIS — Z1231 Encounter for screening mammogram for malignant neoplasm of breast: Secondary | ICD-10-CM | POA: Diagnosis not present

## 2019-11-06 DIAGNOSIS — E875 Hyperkalemia: Secondary | ICD-10-CM | POA: Diagnosis not present

## 2019-11-06 DIAGNOSIS — R69 Illness, unspecified: Secondary | ICD-10-CM | POA: Diagnosis not present

## 2019-11-06 DIAGNOSIS — J449 Chronic obstructive pulmonary disease, unspecified: Secondary | ICD-10-CM | POA: Diagnosis not present

## 2019-11-06 DIAGNOSIS — E782 Mixed hyperlipidemia: Secondary | ICD-10-CM | POA: Diagnosis not present

## 2019-11-06 DIAGNOSIS — M25562 Pain in left knee: Secondary | ICD-10-CM | POA: Diagnosis not present

## 2019-11-13 ENCOUNTER — Other Ambulatory Visit (HOSPITAL_COMMUNITY): Payer: Self-pay | Admitting: Internal Medicine

## 2019-11-13 DIAGNOSIS — Z13820 Encounter for screening for osteoporosis: Secondary | ICD-10-CM

## 2019-11-18 DIAGNOSIS — E039 Hypothyroidism, unspecified: Secondary | ICD-10-CM | POA: Diagnosis not present

## 2019-11-18 DIAGNOSIS — R0602 Shortness of breath: Secondary | ICD-10-CM | POA: Diagnosis not present

## 2019-11-18 DIAGNOSIS — R0902 Hypoxemia: Secondary | ICD-10-CM | POA: Diagnosis not present

## 2019-11-20 DIAGNOSIS — R0902 Hypoxemia: Secondary | ICD-10-CM | POA: Diagnosis not present

## 2019-11-20 DIAGNOSIS — Z0000 Encounter for general adult medical examination without abnormal findings: Secondary | ICD-10-CM | POA: Diagnosis not present

## 2019-11-20 DIAGNOSIS — R509 Fever, unspecified: Secondary | ICD-10-CM | POA: Diagnosis not present

## 2019-11-20 DIAGNOSIS — R69 Illness, unspecified: Secondary | ICD-10-CM | POA: Diagnosis not present

## 2019-11-20 DIAGNOSIS — R5381 Other malaise: Secondary | ICD-10-CM | POA: Diagnosis not present

## 2019-11-20 DIAGNOSIS — J060 Acute laryngopharyngitis: Secondary | ICD-10-CM | POA: Diagnosis not present

## 2019-11-20 DIAGNOSIS — Z716 Tobacco abuse counseling: Secondary | ICD-10-CM | POA: Diagnosis not present

## 2019-11-20 DIAGNOSIS — E782 Mixed hyperlipidemia: Secondary | ICD-10-CM | POA: Diagnosis not present

## 2019-11-20 DIAGNOSIS — Z23 Encounter for immunization: Secondary | ICD-10-CM | POA: Diagnosis not present

## 2019-11-20 DIAGNOSIS — R5383 Other fatigue: Secondary | ICD-10-CM | POA: Diagnosis not present

## 2019-11-21 ENCOUNTER — Other Ambulatory Visit: Payer: Self-pay

## 2019-11-21 ENCOUNTER — Other Ambulatory Visit (HOSPITAL_COMMUNITY): Payer: Self-pay | Admitting: Adult Health Nurse Practitioner

## 2019-11-21 ENCOUNTER — Ambulatory Visit (HOSPITAL_COMMUNITY)
Admission: RE | Admit: 2019-11-21 | Discharge: 2019-11-21 | Disposition: A | Discharge status: Home / Self Care | Payer: Medicare HMO | Source: Ambulatory Visit | Attending: Adult Health Nurse Practitioner | Admitting: Adult Health Nurse Practitioner

## 2019-11-21 DIAGNOSIS — R059 Cough, unspecified: Secondary | ICD-10-CM | POA: Diagnosis not present

## 2019-11-21 DIAGNOSIS — R918 Other nonspecific abnormal finding of lung field: Secondary | ICD-10-CM | POA: Diagnosis not present

## 2019-11-21 DIAGNOSIS — J9819 Other pulmonary collapse: Secondary | ICD-10-CM | POA: Insufficient documentation

## 2019-11-24 ENCOUNTER — Other Ambulatory Visit (HOSPITAL_COMMUNITY): Payer: Self-pay | Admitting: Adult Health Nurse Practitioner

## 2019-11-24 DIAGNOSIS — R059 Cough, unspecified: Secondary | ICD-10-CM

## 2019-12-15 ENCOUNTER — Institutional Professional Consult (permissible substitution): Payer: Medicare HMO | Admitting: Pulmonary Disease

## 2019-12-26 ENCOUNTER — Encounter: Payer: Self-pay | Admitting: Internal Medicine

## 2019-12-26 ENCOUNTER — Ambulatory Visit (HOSPITAL_COMMUNITY)
Admission: RE | Admit: 2019-12-26 | Discharge: 2019-12-26 | Disposition: A | Discharge status: Home / Self Care | Payer: Medicare HMO | Source: Ambulatory Visit | Attending: Internal Medicine | Admitting: Internal Medicine

## 2019-12-26 ENCOUNTER — Institutional Professional Consult (permissible substitution): Payer: Medicare HMO | Admitting: Internal Medicine

## 2019-12-26 ENCOUNTER — Ambulatory Visit: Payer: Medicare HMO | Admitting: Internal Medicine

## 2019-12-26 ENCOUNTER — Other Ambulatory Visit: Payer: Self-pay

## 2019-12-26 DIAGNOSIS — F17210 Nicotine dependence, cigarettes, uncomplicated: Secondary | ICD-10-CM

## 2019-12-26 DIAGNOSIS — J9819 Other pulmonary collapse: Secondary | ICD-10-CM | POA: Diagnosis not present

## 2019-12-26 DIAGNOSIS — J449 Chronic obstructive pulmonary disease, unspecified: Secondary | ICD-10-CM

## 2019-12-26 DIAGNOSIS — R059 Cough, unspecified: Secondary | ICD-10-CM | POA: Diagnosis not present

## 2019-12-26 DIAGNOSIS — R69 Illness, unspecified: Secondary | ICD-10-CM | POA: Diagnosis not present

## 2019-12-26 NOTE — Progress Notes (Signed)
Karen Church, female    DOB: 1950/04/28    MRN: [id number]   Brief patient profile:  82 yowf active smoker with pattern of acute flares of cough with purulent sptum and pleuritic pain usually same area most noted in back, sometimes in front with most recent cxr's form 11/21/19 showing persistent RML atx/ nodular changes R base so referred to pulmonary clinic in [place]  12/26/2019 by [name]      History of Present Illness  12/26/2019  Pulmonary/ 1st office eval/ Akyra Bouchie / [place] [facility]  Chief Complaint  Patient presents with  . Pulmonary Consult    Referred by [name], NP for eval of abnormal cxr. Pt states has had PNA multiple times since 2008.  She had PNA last in Oct 2021- has had some weakness since then but overall her breathing is doing well. She has prod cough with clear sputum.  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   Sleeping as above without nocturnal  am exacerbation  of respiratory  c/o's or need for noct saba. Also denies any obvious fluctuation of symptoms with weather or environmental changes or other aggravating or alleviating factors except as outlined above   No unusual exposure hx or h/o childhood pna/ asthma or knowledge of premature birth.  Current Allergies, Complete Past Medical History, Past Surgical History, Family History, and Social History were reviewed in [employer] record.  ROS  The following are not active complaints unless bolded Hoarseness, sore throat, dysphagia, dental problems, itching, sneezing,  nasal congestion or discharge of  excess mucus or purulent secretions, ear ache,   fever, chills, sweats, unintended wt loss or wt gain, classically pleuritic or exertional cp,  orthopnea pnd or arm/hand swelling  or leg swelling, presyncope, palpitations, abdominal pain, anorexia, nausea, vomiting, diarrhea  or change in bowel habits or change in bladder habits, change in stools or change in urine, dysuria, hematuria,  rash, arthralgias, visual complaints, headache, numbness, weakness or ataxia or problems with walking or coordination,  change in mood or  memory.           Past Medical History:  Diagnosis Date  . Carpal tunnel syndrome   . COPD (chronic obstructive pulmonary disease) (HCC)     Outpatient Medications Prior to Visit  Medication Sig Dispense Refill  . Ascorbic Acid (VITAMIN C PO) Take 1 tablet by mouth daily.    Marland Kitchen aspirin EC 81 MG tablet Take 81 mg by mouth daily.     . Multiple Vitamin (MULTIVITAMIN WITH MINERALS) TABS Take 1 tablet by mouth daily.    . naproxen sodium (ANAPROX) 220 MG tablet Take 220-440 mg by mouth daily as needed (pain).     . TRELEGY ELLIPTA 100-62.5-25 MCG/INH AEPB Inhale 1 puff into the lungs daily.    Marland Kitchen albuterol (PROVENTIL) (5 MG/ML) 0.5% nebulizer solution Take 0.5 mLs (2.5 mg total) by nebulization every 6 (six) hours as needed for wheezing or shortness of breath. 20 mL 1  .       .     0  .  famotidine (PEPCID) 20 MG tablet Take 1 tablet (20 mg total) by mouth 2 (two) times daily. 10 tablet 0  . furosemide (LASIX) 20 MG tablet Take 20 mg by mouth daily.    .       .       No facility-administered medications prior to visit.     Objective:     BP 132/70 (BP Location: Left Arm, Cuff Size: Normal)   Pulse 97   Temp (!) 97.5 F (36.4 C) (Temporal)   Ht 5\' 2"  (1.575 m)   Wt 113 lb (51.3 kg)   SpO2 95% Comment: on RA  BMI 20.67 kg/m   SpO2: 95 % (on RA)     Relatively thin amb wf / rattling congested sounding cough   HEENT : pt wearing mask not removed for exam due  to covid - 19 concerns.    NECK :  without JVD/Nodes/TM/ nl carotid upstrokes bilaterally   LUNGS: no acc muscle use,  Mild barrel  contour chest wall with bilateral  Distant bs s audible wheeze and  without cough on insp or exp maneuvers  and mild  Hyperresonant  to  percussion bilaterally     CV:  RRR  no s3 or murmur or increase in P2, and no edema   ABD:  soft and nontender with pos end  insp Hoover's  in the supine position. No bruits or organomegaly appreciated, bowel sounds nl  MS:   Nl gait/  ext warm without deformities, calf tenderness, cyanosis or clubbing No obvious joint restrictions   SKIN: warm and dry without lesions    NEURO:  alert, approp, nl sensorium with  no motor or cerebellar deficits apparent.        CXR PA and Lateral:   12/26/2019 :    I personally reviewed images and agree with radiology impression as follows:    Similar hyperinflation and chronic parenchymal lung changes with improved aeration of the right middle lobe.  No new focal consolidation.        Assessment   COPD GOLD ?  Active smoker  - 12/26/2019  Rec mucinex / flutter and continue trelegy  - HRCT ordered looking for bronchiectasis as well     Group D in terms of symptom/risk and laba/lama/ICS  therefore appropriate rx at this point >>>  Continue trelegy and work on stopping smoking       Right middle lobe syndrome See cxr 12/26/2019 > minimal residual atx   Typically this is benign even in smokers but will be f/u with HRCT chest looking for bronchiectasis to follow in 01/22/20  Discussed in detail all the  indications, usual  risks and alternatives  relative to the benefits with patient who agrees to proceed with w/u as outlined.         Cigarette smoker Counseled re importance of smoking cessation but did not meet time criteria for separate billing    Pt informed of the seriousness of COVID 19 infection as a direct risk to lung health  and safey and to close  contacts and should continue to wear a facemask in public and minimize exposure to public locations but especially avoid any area or activity where non-close contacts are not observing distancing or wearing an appropriate face mask.  I strongly recommended she take either of the vaccines available through local drugstores based on updated information on millions of Americans treated with the Moderna and 01/24/20 products  which have proven both safe and  effective even against the new delta variant.       Each maintenance medication was reviewed in detail including emphasizing most importantly the difference between maintenance and prns and under what circumstances the prns are to be triggered using an action plan format where appropriate.  Total time for H and P, chart review, counseling, teaching device and generating customized AVS unique to this office visit / charting  >  60 min          Sandrea Hughs, MD 12/26/2019

## 2019-12-26 NOTE — Assessment & Plan Note (Signed)
Counseled re importance of smoking cessation but did not meet time criteria for separate billing     Pt informed of the seriousness of COVID 19 infection as a direct risk to lung health  and safey and to close contacts and should continue to wear a facemask in public and minimize exposure to public locations but especially avoid any area or activity where non-close contacts are not observing distancing or wearing an appropriate face mask.  I strongly recommended she take either of the vaccines available through local drugstores based on updated information on millions of Americans treated with the Moderna and [employer] products  which have proven both safe and  effective even against the new delta variant.            Each maintenance medication was reviewed in detail including emphasizing most importantly the difference between maintenance and prns and under what circumstances the prns are to be triggered using an action plan format where appropriate.  Total time for H and P, chart review, counseling, teaching device and generating customized AVS unique to this office visit / charting  >  60 min

## 2019-12-26 NOTE — Patient Instructions (Signed)
Mucociliary escalator function is abnormal and will lead to a condition called bronchiectasis which is best detected by a high definition CT   Best treatment is a drug called mucinex 1200 mg every 12 hours and use a flutter valve as possible especially first thing in am   Please remember to go to the  x-ray department  @  [facility] for your tests - we will call you with the results when they are available     My office will be calling to schedule a high resolution CT chest at your convenience.  The key is to stop smoking completely before smoking completely stops you!   Continue trelegy daily   Nebulizer (machine) can be used up to every 4 hours if needed   Please schedule a follow up visit in 3 months but call   with PFTs on return    I very strongly recommend you get the moderna or pfizer vaccine as soon as possible based on your risk of dying from the virus  and the proven safety and benefit of these vaccines against even the delta variant.  This can save your life as well as  those of your loved ones,  especially if they are also not vaccinated.

## 2019-12-26 NOTE — Assessment & Plan Note (Addendum)
Active smoker  - 12/26/2019  Rec mucinex / flutter and continue trelegy  - HRCT ordered looking for bronchiectasis as well     Group D in terms of symptom/risk and laba/lama/ICS  therefore appropriate rx at this point >>>  Continue trelegy and work on stopping smoking

## 2019-12-27 ENCOUNTER — Encounter: Payer: Self-pay | Admitting: Internal Medicine

## 2019-12-27 DIAGNOSIS — A159 Respiratory tuberculosis unspecified: Secondary | ICD-10-CM | POA: Insufficient documentation

## 2019-12-27 DIAGNOSIS — J9819 Other pulmonary collapse: Secondary | ICD-10-CM | POA: Insufficient documentation

## 2019-12-27 NOTE — Assessment & Plan Note (Signed)
See cxr 12/26/2019 > minimal residual atx   Typically this is benign even in smokers but will be f/u with HRCT chest looking for bronchiectasis to follow in 01/22/20  Discussed in detail all the  indications, usual  risks and alternatives  relative to the benefits with patient who agrees to proceed with w/u as outlined.

## 2020-01-02 NOTE — Progress Notes (Signed)
Called and left a detailed msg on machine with results ok per DPR

## 2020-01-10 ENCOUNTER — Ambulatory Visit (HOSPITAL_COMMUNITY)
Admission: RE | Admit: 2020-01-10 | Discharge: 2020-01-10 | Disposition: A | Discharge status: Home / Self Care | Payer: Medicare HMO | Source: Ambulatory Visit | Attending: Internal Medicine | Admitting: Internal Medicine

## 2020-01-10 ENCOUNTER — Other Ambulatory Visit: Payer: Self-pay

## 2020-01-10 DIAGNOSIS — Z13820 Encounter for screening for osteoporosis: Secondary | ICD-10-CM | POA: Insufficient documentation

## 2020-01-10 DIAGNOSIS — R634 Abnormal weight loss: Secondary | ICD-10-CM | POA: Diagnosis not present

## 2020-01-10 DIAGNOSIS — Z87891 Personal history of nicotine dependence: Secondary | ICD-10-CM | POA: Diagnosis not present

## 2020-01-10 DIAGNOSIS — Z780 Asymptomatic menopausal state: Secondary | ICD-10-CM | POA: Diagnosis not present

## 2020-01-10 DIAGNOSIS — M85851 Other specified disorders of bone density and structure, right thigh: Secondary | ICD-10-CM | POA: Diagnosis not present

## 2020-01-10 DIAGNOSIS — R29890 Loss of height: Secondary | ICD-10-CM | POA: Diagnosis not present

## 2020-01-10 DIAGNOSIS — M8589 Other specified disorders of bone density and structure, multiple sites: Secondary | ICD-10-CM | POA: Diagnosis not present

## 2020-01-22 ENCOUNTER — Ambulatory Visit (HOSPITAL_COMMUNITY)
Admission: RE | Admit: 2020-01-22 | Discharge: 2020-01-22 | Disposition: A | Discharge status: Home / Self Care | Payer: Medicare HMO | Source: Ambulatory Visit | Attending: Internal Medicine | Admitting: Internal Medicine

## 2020-01-22 ENCOUNTER — Other Ambulatory Visit: Payer: Self-pay

## 2020-01-22 DIAGNOSIS — R059 Cough, unspecified: Secondary | ICD-10-CM | POA: Diagnosis not present

## 2020-01-22 DIAGNOSIS — J449 Chronic obstructive pulmonary disease, unspecified: Secondary | ICD-10-CM | POA: Diagnosis not present

## 2020-01-23 ENCOUNTER — Encounter: Payer: Self-pay | Admitting: Internal Medicine

## 2020-01-23 NOTE — Progress Notes (Signed)
LMTCB

## 2020-01-24 ENCOUNTER — Encounter: Payer: Self-pay | Admitting: *Deleted

## 2020-01-24 NOTE — Progress Notes (Signed)
LMTCB and will mail letter

## 2020-01-25 ENCOUNTER — Telehealth: Payer: Self-pay | Admitting: Internal Medicine

## 2020-01-25 NOTE — Telephone Encounter (Signed)
lmtcb for pt.   Results of HRCT per Dr. Sherene Sires:  Call patient : Study is shows lots of tiny areas of inflammation of unclear source so will need f/u ov moved up for further testing to sort out what new steps are needed but likely include blood and sputum sampling - nothing to suggest cancer though so this is the good news

## 2020-01-25 NOTE — Telephone Encounter (Signed)
Patient is returning phone call. Patient phone number is [phone].

## 2020-01-25 NOTE — Telephone Encounter (Signed)
Spoke with pt, aware of recs.  Scheduled to see MW on 1/12.  Nothing further needed at this time- will close encounter.

## 2020-02-21 ENCOUNTER — Other Ambulatory Visit: Payer: Self-pay

## 2020-02-21 ENCOUNTER — Other Ambulatory Visit (HOSPITAL_COMMUNITY)
Admission: RE | Admit: 2020-02-21 | Discharge: 2020-02-21 | Disposition: A | Discharge status: Home / Self Care | Payer: Medicare HMO | Source: Ambulatory Visit | Attending: Internal Medicine | Admitting: Internal Medicine

## 2020-02-21 ENCOUNTER — Ambulatory Visit (INDEPENDENT_AMBULATORY_CARE_PROVIDER_SITE_OTHER): Payer: Medicare HMO | Admitting: Internal Medicine

## 2020-02-21 ENCOUNTER — Encounter: Payer: Self-pay | Admitting: Internal Medicine

## 2020-02-21 VITALS — BP 140/80 | HR 99 | Temp 97.4°F | Ht 62.0 in | Wt 113.8 lb

## 2020-02-21 DIAGNOSIS — J449 Chronic obstructive pulmonary disease, unspecified: Secondary | ICD-10-CM | POA: Diagnosis not present

## 2020-02-21 DIAGNOSIS — F17210 Nicotine dependence, cigarettes, uncomplicated: Secondary | ICD-10-CM | POA: Diagnosis not present

## 2020-02-21 DIAGNOSIS — A159 Respiratory tuberculosis unspecified: Secondary | ICD-10-CM

## 2020-02-21 DIAGNOSIS — R69 Illness, unspecified: Secondary | ICD-10-CM | POA: Diagnosis not present

## 2020-02-21 LAB — CBC WITH DIFFERENTIAL/PLATELET
Abs Immature Granulocytes: 0.08 10*3/uL — ABNORMAL HIGH (ref 0.00–0.07)
Basophils Absolute: 0 10*3/uL (ref 0.0–0.1)
Basophils Relative: 0 %
Eosinophils Absolute: 0.2 10*3/uL (ref 0.0–0.5)
Eosinophils Relative: 2 %
HCT: 45.9 % (ref 36.0–46.0)
Hemoglobin: 14.8 g/dL (ref 12.0–15.0)
Immature Granulocytes: 1 %
Lymphocytes Relative: 20 %
Lymphs Abs: 2.6 10*3/uL (ref 0.7–4.0)
MCH: 30.5 pg (ref 26.0–34.0)
MCHC: 32.2 g/dL (ref 30.0–36.0)
MCV: 94.4 fL (ref 80.0–100.0)
Monocytes Absolute: 0.4 10*3/uL (ref 0.1–1.0)
Monocytes Relative: 3 %
Neutro Abs: 9.4 10*3/uL — ABNORMAL HIGH (ref 1.7–7.7)
Neutrophils Relative %: 74 %
Platelets: 378 10*3/uL (ref 150–400)
RBC: 4.86 MIL/uL (ref 3.87–5.11)
RDW: 14.4 % (ref 11.5–15.5)
WBC: 12.7 10*3/uL — ABNORMAL HIGH (ref 4.0–10.5)
nRBC: 0 % (ref 0.0–0.2)

## 2020-02-21 LAB — SEDIMENTATION RATE: Sed Rate: 63 mm/hr — ABNORMAL HIGH (ref 0–22)

## 2020-02-21 NOTE — Progress Notes (Signed)
Karen Church, female    DOB: Jan 01, 1951    MRN: [id number]   Brief patient profile:  89 yowf active smoker with pattern of acute flares of cough with purulent sptum and pleuritic pain usually same area most noted in back, sometimes in front with most recent cxr's form 11/21/19 showing persistent RML atx/ nodular changes R base so referred to pulmonary clinic in [place]  12/26/2019 by Karen Church      History of Present Illness  12/26/2019  Pulmonary/ 1st office eval/ Karen Church / [place] [facility]  Chief Complaint  Patient presents with  . Pulmonary Consult    Referred by Karen Rutherford, NP for eval of abnormal cxr. Pt states has had PNA multiple times since 2008.  She had PNA last in Oct 2021- has had some weakness since then but overall her breathing is doing well. She has prod cough with clear sputum.  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 Continue trelegy daily  Nebulizer (machine) can be used up to every 4 hours if needed  Please schedule a follow up visit in 3 months but call   with PFTs on return   I very strongly recommend you get the moderna or pfizer vaccine      02/21/2020  f/u ov/[place] office/Karen Church re:  ?MAI   / maint trelegy 100   Chief Complaint  Patient presents with  . Follow-up    Patient has a productive cough with white foamy sputum. Denies shortness of breath.   Dyspnea: no change activity tol  Cough:  white/ foamy worse in am  Sleeping: mostly in recliner about 30 degrees SABA use: rarely neb 02: none    No obvious day to day or daytime variability or assoc current  purulent sputum or mucus plugs or hemoptysis or cp or chest tightness, subjective wheeze or overt sinus or hb symptoms.   Sleeping  without nocturnal  or early am exacerbation  of respiratory  c/o's or need for noct saba. Also denies any obvious fluctuation of symptoms with weather or environmental changes or other aggravating or alleviating factors except as outlined above   No unusual exposure hx or h/o childhood pna/ asthma or knowledge of premature birth.  Current Allergies, Complete Past Medical History, Past Surgical History, Family History, and Social History were reviewed in [employer] record.  ROS  The following are not active complaints unless bolded Hoarseness, sore throat, dysphagia, dental problems, itching, sneezing,  nasal congestion or discharge of excess mucus or purulent secretions, ear ache,   fever, chills, sweats, unintended wt loss or wt gain, classically pleuritic or exertional cp,  orthopnea pnd or arm/hand swelling  or leg swelling, presyncope, palpitations, abdominal pain, anorexia, nausea, vomiting, diarrhea  or change in bowel habits or change in bladder habits, change in stools or change in urine, dysuria, hematuria,  rash, arthralgias, visual complaints, headache, numbness, weakness or ataxia  or problems with walking or coordination,  change in mood or  memory.        Current Meds  Medication Sig  . Ascorbic Acid (VITAMIN C PO) Take 1 tablet by mouth daily.  Marland Kitchen aspirin EC 81 MG tablet Take 81 mg by mouth daily.   . Multiple Vitamin (MULTIVITAMIN WITH MINERALS) TABS Take 1 tablet by mouth daily.  . naproxen sodium (ANAPROX) 220 MG tablet Take 220-440 mg by mouth daily as needed (pain).   . TRELEGY ELLIPTA 100-62.5-25 MCG/INH AEPB Inhale 1 puff into the lungs daily.               Past Medical History:  Diagnosis Date  . Carpal tunnel syndrome   . COPD (chronic obstructive pulmonary disease) (HCC)     Outpatient Medications Prior to Visit  Medication Sig Dispense Refill  . Ascorbic Acid (VITAMIN C PO) Take 1 tablet by mouth daily.    Marland Kitchen aspirin EC 81 MG tablet Take 81 mg by mouth daily.     . Multiple Vitamin (MULTIVITAMIN WITH MINERALS) TABS Take 1 tablet by mouth daily.    . naproxen sodium (ANAPROX) 220 MG tablet Take 220-440 mg by mouth daily as needed (pain).     . TRELEGY ELLIPTA 100-62.5-25 MCG/INH AEPB Inhale 1 puff into the lungs daily.    Marland Kitchen albuterol (PROVENTIL) (5 MG/ML) 0.5% nebulizer solution Take 0.5 mLs (2.5 mg total) by nebulization every 6 (six) hours as needed for wheezing or shortness of breath. 20 mL 1  .       .     0  . famotidine (PEPCID) 20 MG tablet Take 1 tablet (20 mg total) by mouth 2 (two) times daily. 10 tablet 0  . furosemide (LASIX) 20 MG tablet Take 20 mg by mouth daily.    .       .       No facility-administered medications prior to visit.     Objective:       Wt Readings from Last 3 Encounters:  02/21/20 113 lb 12.8 oz (51.6 kg)  12/26/19 113 lb (51.3 kg)  09/14/18 120 lb (54.4 kg)      Vital signs reviewed  02/21/2020  - Note at rest 02 sats  96% on RA   General appearance:   Pleasant amb wf nad     HEENT : pt wearing mask not removed for exam due to covid - 19 concerns.    NECK :  without JVD/Nodes/TM/ nl carotid upstrokes bilaterally   LUNGS: no acc muscle use,  Mild barrel  contour chest wall with bilateral  Distant bs s audible wheeze and  without cough on insp or exp maneuvers  and mild  Hyperresonant  to  percussion bilaterally     CV:  RRR  no s3 or murmur or increase in P2, and no edema   ABD:  soft and nontender with pos end  insp Hoover's  in the supine position. No bruits or organomegaly appreciated, bowel sounds nl  MS:   Nl gait/  ext warm without deformities, calf tenderness,  cyanosis or clubbing No obvious joint restrictions   SKIN: warm and dry without lesions    NEURO:  alert, approp, nl sensorium with  no motor or cerebellar deficits apparent.            Assessment

## 2020-02-21 NOTE — Patient Instructions (Addendum)
We need to be sure   your pfts are done  prior to your next appt.   We will give you a specimen cup to return to the lab first thing in am - nasty mucus only   Try to cut down/ quit smoking as soon as possible    I very strongly recommend you get the moderna or pfizer vaccine as soon as possible based on your risk of dying from the virus  and the proven safety and benefit of these vaccines against even the delta and omicron variants.  This can save your life as well as  those of your loved ones,  especially if they are also not vaccinated.   8  Please remember to go to the lab department @ [facility] for your tests - we will call you with the results when they are available.  Keep your previous appointments

## 2020-02-22 ENCOUNTER — Encounter: Payer: Self-pay | Admitting: Internal Medicine

## 2020-02-22 ENCOUNTER — Other Ambulatory Visit (HOSPITAL_COMMUNITY)
Admission: RE | Admit: 2020-02-22 | Discharge: 2020-02-22 | Disposition: A | Discharge status: Home / Self Care | Payer: Medicare HMO | Source: Other Acute Inpatient Hospital | Attending: Internal Medicine | Admitting: Internal Medicine

## 2020-02-22 DIAGNOSIS — A159 Respiratory tuberculosis unspecified: Secondary | ICD-10-CM | POA: Diagnosis not present

## 2020-02-22 NOTE — Assessment & Plan Note (Signed)
Active smoker  - 12/26/2019  Rec mucinex / flutter and continue trelegy   pfts pending but  Group D in terms of symptom/risk and laba/lama/ICS  therefore appropriate rx at this point >>>  Continue trelegy daily

## 2020-02-22 NOTE — Assessment & Plan Note (Addendum)
Counseled re importance of smoking cessation but did not meet time criteria for separate billing    Discussed: Patient is unvaccinated and was informed of the seriousness of COVID 19 infection as a direct risk to lung health as well as safety to close contacts and should continue to wear a facemask in public and minimize exposure to public locations but especially avoid any area or activity where non-close contacts are not observing distancing or wearing an appropriate face mask.  I strongly recommended pt take either of the vaccines available through local drugstores based on updated information on millions of Americans treated with the Moderna and [employer] products  which have proven both safe and  effective even against the  delta and new omicron variant to prevent hospitalization and death.          Each maintenance medication was reviewed in detail including emphasizing most importantly the difference between maintenance and prns and under what circumstances the prns are to be triggered using an action plan format where appropriate.  Total time for H and P, chart review, counseling, reviewing  Studies  and generating customized AVS unique to this office visit / same day charting > 30 min

## 2020-02-22 NOTE — Assessment & Plan Note (Signed)
See cxr 12/26/2019 > minimal residual atx  - HRCT chest 01/22/20 1. There are innumerable new pulmonary nodules throughout the lungs,most concentrated in the upper lobes. The majority of these nodules are ground-glass in character, however several are solid and spiculated in morphology, the largest in the posterior left upper lobe measuring 1.0 x 0.9 cm and in the right upper lobe measuring 0.9 x 0.6 cm. Multiplicity and distribution are generally most suggestive of atypical infection, however morphology of the largest solid nodules is concerning for malignancy. Recommend initial follow-up CT in 3 months to assess for stability or resolution, and it may be further necessary to characterize the largest nodules for FDG avidity by PET-CT. Recommend multidisciplinary thoracic referral for further evaluation. 2. Small consolidations of the medial segment right middle lobe and lingula, similar to prior examination and consistent with sequelae of prior atypical infection, including atypical mycobacterium. 3. Diffuse bilateral bronchial wall thickening, consistent with nonspecific infectious or inflammatory bronchitis. 4. No significant bronchiectasis. 5. Emphysema. 6. Coronary artery disease. Aortic atherosclerosis.rec >> return for pfts, quant TB and Quant Ig's as well as Alpha one /  esr/eos and sputum for routine and afb if sample avail> ordered 02/21/2020   Discussed at length the concept of mucociliary dysfunction assoc with the dx using escalator analogy and emphasizing she start breathing clean air as her escalators can't carry the various contaminants in combusted tobacco out of her lungs effectively in this setting and there is no cure for the condition  She may be a candidate for longterm abx if we can establish cause and effect with MAI so we'll try sputum samples first then consider BAL   Discussed in detail all the  indications, usual  risks and alternatives  relative to the benefits  with patient who agrees to proceed with w/u as outlined.

## 2020-02-23 LAB — QUANTIFERON-TB GOLD PLUS: QuantiFERON-TB Gold Plus: NEGATIVE

## 2020-02-23 LAB — QUANTIFERON-TB GOLD PLUS (RQFGPL)
QuantiFERON Mitogen Value: 8.58 IU/mL
QuantiFERON Nil Value: 0 IU/mL
QuantiFERON TB1 Ag Value: 0 IU/mL
QuantiFERON TB2 Ag Value: 0.01 IU/mL

## 2020-02-24 LAB — IGE: IgE (Immunoglobulin E), Serum: 25 IU/mL (ref 6–495)

## 2020-02-25 LAB — CULTURE, RESPIRATORY W GRAM STAIN
Culture: NORMAL
Gram Stain: NONE SEEN

## 2020-02-26 LAB — ALPHA-1 ANTITRYPSIN PHENOTYPE: A-1 Antitrypsin, Ser: 213 mg/dL — ABNORMAL HIGH (ref 101–187)

## 2020-02-29 ENCOUNTER — Other Ambulatory Visit: Payer: Self-pay

## 2020-02-29 ENCOUNTER — Other Ambulatory Visit (HOSPITAL_COMMUNITY)
Admission: RE | Admit: 2020-02-29 | Discharge: 2020-02-29 | Disposition: A | Discharge status: Home / Self Care | Payer: Medicare HMO | Source: Ambulatory Visit | Attending: Internal Medicine | Admitting: Internal Medicine

## 2020-02-29 DIAGNOSIS — A159 Respiratory tuberculosis unspecified: Secondary | ICD-10-CM | POA: Diagnosis not present

## 2020-03-01 ENCOUNTER — Telehealth: Payer: Self-pay | Admitting: Internal Medicine

## 2020-03-01 NOTE — Telephone Encounter (Signed)
See results note.  Nothing further needed. 

## 2020-03-01 NOTE — Progress Notes (Signed)
Called and spoke with patient, provided results/recommendations per Dr. Sherene Sires.  She verbalized understanding.  She states she took more samples of sputum yesterday.  Nothing further needed.

## 2020-03-01 NOTE — Progress Notes (Signed)
Called and left message on voicemail to please return phone call to go over results. Contact number provided. 

## 2020-03-02 LAB — ACID FAST SMEAR (AFB, MYCOBACTERIA): Acid Fast Smear: NEGATIVE

## 2020-03-12 ENCOUNTER — Telehealth: Payer: Self-pay | Admitting: Internal Medicine

## 2020-03-13 NOTE — Telephone Encounter (Signed)
Attempted to call pt. Left message for pt to call back.  

## 2020-03-14 NOTE — Telephone Encounter (Signed)
Patient checking on sputum results Let her know that takes longer to get back and would follow up once we had results back .  Nothing further needed at this time.

## 2020-04-01 ENCOUNTER — Ambulatory Visit: Payer: Medicare HMO | Admitting: Internal Medicine

## 2020-04-03 ENCOUNTER — Ambulatory Visit: Payer: Medicare HMO | Admitting: Internal Medicine

## 2020-04-03 DIAGNOSIS — J449 Chronic obstructive pulmonary disease, unspecified: Secondary | ICD-10-CM | POA: Diagnosis not present

## 2020-04-03 DIAGNOSIS — R69 Illness, unspecified: Secondary | ICD-10-CM | POA: Diagnosis not present

## 2020-04-03 DIAGNOSIS — R000 Tachycardia, unspecified: Secondary | ICD-10-CM | POA: Diagnosis not present

## 2020-04-03 DIAGNOSIS — R0902 Hypoxemia: Secondary | ICD-10-CM | POA: Diagnosis not present

## 2020-04-03 DIAGNOSIS — R0602 Shortness of breath: Secondary | ICD-10-CM | POA: Diagnosis not present

## 2020-04-03 DIAGNOSIS — J9601 Acute respiratory failure with hypoxia: Secondary | ICD-10-CM | POA: Diagnosis not present

## 2020-04-06 ENCOUNTER — Encounter (HOSPITAL_COMMUNITY): Payer: Self-pay | Admitting: *Deleted

## 2020-04-06 ENCOUNTER — Other Ambulatory Visit: Payer: Self-pay

## 2020-04-06 ENCOUNTER — Emergency Department (HOSPITAL_COMMUNITY): Payer: Medicare HMO

## 2020-04-06 ENCOUNTER — Emergency Department (HOSPITAL_COMMUNITY)
Admission: EM | Admit: 2020-04-06 | Discharge: 2020-04-06 | Disposition: A | Discharge status: Home / Self Care | Payer: Medicare HMO | Attending: Emergency Medicine | Admitting: Emergency Medicine

## 2020-04-06 DIAGNOSIS — R059 Cough, unspecified: Secondary | ICD-10-CM | POA: Diagnosis present

## 2020-04-06 DIAGNOSIS — J441 Chronic obstructive pulmonary disease with (acute) exacerbation: Secondary | ICD-10-CM

## 2020-04-06 DIAGNOSIS — Z20822 Contact with and (suspected) exposure to covid-19: Secondary | ICD-10-CM | POA: Diagnosis not present

## 2020-04-06 DIAGNOSIS — Z7982 Long term (current) use of aspirin: Secondary | ICD-10-CM | POA: Insufficient documentation

## 2020-04-06 DIAGNOSIS — F17210 Nicotine dependence, cigarettes, uncomplicated: Secondary | ICD-10-CM | POA: Diagnosis not present

## 2020-04-06 DIAGNOSIS — R911 Solitary pulmonary nodule: Secondary | ICD-10-CM | POA: Diagnosis not present

## 2020-04-06 DIAGNOSIS — R69 Illness, unspecified: Secondary | ICD-10-CM | POA: Diagnosis not present

## 2020-04-06 DIAGNOSIS — J189 Pneumonia, unspecified organism: Secondary | ICD-10-CM | POA: Insufficient documentation

## 2020-04-06 DIAGNOSIS — J90 Pleural effusion, not elsewhere classified: Secondary | ICD-10-CM | POA: Diagnosis not present

## 2020-04-06 DIAGNOSIS — R0602 Shortness of breath: Secondary | ICD-10-CM | POA: Diagnosis not present

## 2020-04-06 LAB — COMPREHENSIVE METABOLIC PANEL
ALT: 52 U/L — ABNORMAL HIGH (ref 0–44)
AST: 36 U/L (ref 15–41)
Albumin: 3.5 g/dL (ref 3.5–5.0)
Alkaline Phosphatase: 81 U/L (ref 38–126)
Anion gap: 8 (ref 5–15)
BUN: 17 mg/dL (ref 8–23)
CO2: 27 mmol/L (ref 22–32)
Calcium: 9.8 mg/dL (ref 8.9–10.3)
Chloride: 102 mmol/L (ref 98–111)
Creatinine, Ser: 0.65 mg/dL (ref 0.44–1.00)
GFR, Estimated: >60 mL/min (ref >60)
Glucose, Bld: 139 mg/dL — ABNORMAL HIGH (ref 70–99)
Potassium: 4.2 mmol/L (ref 3.5–5.1)
Sodium: 137 mmol/L (ref 135–145)
Total Bilirubin: 0.3 mg/dL (ref 0.3–1.2)
Total Protein: 7.6 g/dL (ref 6.5–8.1)

## 2020-04-06 LAB — CBC WITH DIFFERENTIAL/PLATELET
Abs Immature Granulocytes: 0.31 10*3/uL — ABNORMAL HIGH (ref 0.00–0.07)
Basophils Absolute: 0.1 10*3/uL (ref 0.0–0.1)
Basophils Relative: 1 %
Eosinophils Absolute: 0 10*3/uL (ref 0.0–0.5)
Eosinophils Relative: 0 %
HCT: 42.6 % (ref 36.0–46.0)
Hemoglobin: 13.7 g/dL (ref 12.0–15.0)
Immature Granulocytes: 2 %
Lymphocytes Relative: 5 %
Lymphs Abs: 0.8 10*3/uL (ref 0.7–4.0)
MCH: 30.7 pg (ref 26.0–34.0)
MCHC: 32.2 g/dL (ref 30.0–36.0)
MCV: 95.5 fL (ref 80.0–100.0)
Monocytes Absolute: 0.5 10*3/uL (ref 0.1–1.0)
Monocytes Relative: 3 %
Neutro Abs: 15.5 10*3/uL — ABNORMAL HIGH (ref 1.7–7.7)
Neutrophils Relative %: 89 %
Platelets: 331 10*3/uL (ref 150–400)
RBC: 4.46 MIL/uL (ref 3.87–5.11)
RDW: 14.2 % (ref 11.5–15.5)
WBC: 17.2 10*3/uL — ABNORMAL HIGH (ref 4.0–10.5)
nRBC: 0 % (ref 0.0–0.2)

## 2020-04-06 LAB — BRAIN NATRIURETIC PEPTIDE: B Natriuretic Peptide: 106 pg/mL — ABNORMAL HIGH (ref 0.0–100.0)

## 2020-04-06 LAB — RESP PANEL BY RT-PCR (FLU A&B, COVID) ARPGX2
Influenza A by PCR: NEGATIVE
Influenza B by PCR: NEGATIVE
SARS Coronavirus 2 by RT PCR: NEGATIVE

## 2020-04-06 MED ORDER — LEVOFLOXACIN 750 MG PO TABS: 750.0000 mg | ORAL_TABLET | Freq: Every day | ORAL | 0 refills | Status: DC

## 2020-04-06 MED ORDER — ALBUTEROL SULFATE HFA 108 (90 BASE) MCG/ACT IN AERS
2.0000 | INHALATION_SPRAY | Freq: Once | RESPIRATORY_TRACT | Status: AC
  Administered 2020-04-06: 2 via RESPIRATORY_TRACT
  Filled 2020-04-06: qty 6.7

## 2020-04-06 MED ORDER — METHYLPREDNISOLONE SODIUM SUCC 125 MG IJ SOLR
125.0000 mg | Freq: Once | INTRAMUSCULAR | Status: AC
  Administered 2020-04-06: 125 mg via INTRAVENOUS
  Filled 2020-04-06: qty 2

## 2020-04-06 MED ORDER — ALBUTEROL (5 MG/ML) CONTINUOUS INHALATION SOLN
20.0000 mg/h | INHALATION_SOLUTION | RESPIRATORY_TRACT | Status: AC
  Administered 2020-04-06: 20 mg/h via RESPIRATORY_TRACT
  Filled 2020-04-06: qty 20

## 2020-04-06 MED ORDER — MAGNESIUM SULFATE 2 GM/50ML IV SOLN
2.0000 g | Freq: Once | INTRAVENOUS | Status: AC
  Administered 2020-04-06: 2 g via INTRAVENOUS
  Filled 2020-04-06: qty 50

## 2020-04-06 MED ORDER — SODIUM CHLORIDE 0.9 % IV SOLN
INTRAVENOUS | Status: DC | PRN
  Administered 2020-04-06: 250 mL via INTRAVENOUS

## 2020-04-06 MED ORDER — LEVOFLOXACIN IN D5W 500 MG/100ML IV SOLN
500.0000 mg | Freq: Once | INTRAVENOUS | Status: AC
  Administered 2020-04-06: 500 mg via INTRAVENOUS
  Filled 2020-04-06: qty 100

## 2020-04-06 MED ORDER — PREDNISONE 20 MG PO TABS: 40.0000 mg | ORAL_TABLET | Freq: Every day | ORAL | 0 refills | Status: DC

## 2020-04-06 MED ORDER — ALBUTEROL SULFATE (5 MG/ML) 0.5% IN NEBU: 2.5000 mg | INHALATION_SOLUTION | Freq: Four times a day (QID) | RESPIRATORY_TRACT | 12 refills | Status: DC | PRN

## 2020-04-06 NOTE — ED Provider Notes (Signed)
I have personally examined this patient.  She is 70 years old and has been having increasing coughing and shortness of breath with wheezing.  On exam the patient is now speaking in full sentences, she is unlabored, she is minimally tachypneic, she still has a prolonged expiratory phase but is moving good air despite the expiratory wheeze.  Her oxygen is 97%, she is getting her continuous nebulizer therapy and feeling better, she is getting Levaquin for infection, she still is adamant about wanting to go home, I have discussed with her the indications for return and that she is going against my recommendations however she has medical decision-making capacity and I feel that she will return should symptoms worsen.  The patient will be discharged home, she is improved compared to arrival  Final diagnoses:  COPD exacerbation (HCC)  Community acquired pneumonia of right lung, unspecified part of lung      Eber Hong, MD 04/06/20 1704

## 2020-04-06 NOTE — ED Triage Notes (Signed)
C/o shortness of breath for 2 weeks, unable to lay down to sleep

## 2020-04-06 NOTE — ED Provider Notes (Signed)
[facility] EMERGENCY DEPARTMENT Provider Note   CSN: [id number] Arrival date & time: 04/06/20  1401     History Chief Complaint  Patient presents with  . Shortness of Breath    Karen Church is a 70 y.o. female.  Patient complains of shortness of breath.  She has COPD.  She does not use oxygen at home.  Patient was started on steroids approximately 3 to 4 days ago.  She has been coughing up yellow-green stuff  The history is provided by the patient and medical records. No language interpreter was used.  Shortness of Breath Severity:  Moderate Onset quality:  Sudden Timing:  Constant Progression:  Worsening Chronicity:  Recurrent Context: activity   Relieved by:  Nothing Associated symptoms: cough   Associated symptoms: no abdominal pain, no chest pain, no headaches and no rash        Past Medical History:  Diagnosis Date  . Carpal tunnel syndrome   . COPD (chronic obstructive pulmonary disease) [facility])     Patient Active Problem List   Diagnosis Date Noted  . 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 COPD GOLD ?  12/26/2019  . Cigarette smoker 12/26/2019  . H. pylori infection 03/16/2016  . Loss of weight 11/20/2015  . Dysphagia 11/20/2015  . Fatigue 11/20/2015  . Encounter for screening colonoscopy 11/20/2015    Past Surgical History:  Procedure Laterality Date  . ABDOMINAL HYSTERECTOMY    . BIOPSY  12/10/2015   Procedure: BIOPSY;  Surgeon: Corbin Ade, MD;  Location: [facility] ENDO SUITE;  Service: Endoscopy;;  Gastric   . CESAREAN SECTION    . COLONOSCOPY N/A 12/10/2015   Procedure: COLONOSCOPY;  Surgeon: Corbin Ade, MD;  Location: [facility] SUITE;  Service: Endoscopy;  Laterality: N/A;  1030  . ESOPHAGOGASTRODUODENOSCOPY N/A 12/10/2015   Procedure: ESOPHAGOGASTRODUODENOSCOPY (EGD);  Surgeon: Corbin Ade, MD;  Location: [facility] END[facility] SU[facility];  Service: Endoscopy;  Laterality: N/A;  . [name] DILATION N/A 12/10/2015   Procedure: [name] DILATION;   Surgeon: Corbin Ade, MD;  Location: [facility] ENDO SUITE;  Service: Endoscopy;  Laterality: N/A;  . SKIN CANCER EXCISION    . SMALL INTESTINE SURGERY N/A 1967   Resection     OB History   No obstetric history on file.     Family History  Problem Relation Age of Onset  . Colon polyps Sister   . Colon polyps Brother   . Colon cancer Neg Hx     Social History   Tobacco Use  . Smoking status: Current Every Day Smoker    Packs/day: 0.50    Years: 50.00    Pack years: 25.00    Types: Cigarettes  . Smokeless tobacco: Never Used  . Tobacco comment: currently at 1/2 ppd 12/26/19//lmr  Vaping Use  . Vaping Use: Never used  Substance Use Topics  . Alcohol use: No  . Drug use: No    Home Medications Prior to Admission medications   Medication Sig Start Date End Date Taking? Authorizing Provider  Ascorbic Acid (VITAMIN C PO) Take 1 tablet by mouth daily.    [provider]  aspirin EC 81 MG tablet Take 81 mg by mouth daily.     [provider]  Multiple Vitamin (MULTIVITAMIN WITH MINERALS) TABS Take 1 tablet by mouth daily.    [provider]  naproxen sodium (ANAPROX) 220 MG tablet Take 220-440 mg by mouth daily as needed (pain).     [provider]  TRELEGY ELLIPTA 100-62.5-25 MCG/INH AEPB Inhale 1 puff into the lungs daily. 12/21/19   [provider]    Allergies    Lasix [furosemide]  Review of Systems   Review of Systems  Constitutional: Negative for appetite change and fatigue.  HENT: Negative for congestion, ear discharge and sinus pressure.   Eyes: Negative for discharge.  Respiratory: Positive for cough and shortness of breath.   Cardiovascular: Negative for chest pain.  Gastrointestinal: Negative for abdominal pain and diarrhea.  Genitourinary: Negative for frequency and hematuria.  Musculoskeletal: Negative for back pain.  Skin: Negative for rash.  Neurological: Negative for seizures and headaches.   Psychiatric/Behavioral: Negative for hallucinations.    Physical Exam Updated Vital Signs BP (!) 160/83   Pulse (!) 121   Temp 98.2 F (36.8 C) (Oral)   Resp (!) 24   SpO2 (!) 88%   Physical Exam Vitals and nursing note reviewed.  Constitutional:      Appearance: She is well-developed.  HENT:     Head: Normocephalic.     Nose: Nose normal.  Eyes:     General: No scleral icterus.    Extraocular Movements: EOM normal.     Conjunctiva/sclera: Conjunctivae normal.  Neck:     Thyroid: No thyromegaly.  Cardiovascular:     Rate and Rhythm: Regular rhythm. Tachycardia present.     Heart sounds: No murmur heard. No friction rub. No gallop.   Pulmonary:     Breath sounds: No stridor. Wheezing present. No rales.  Chest:     Chest wall: No tenderness.  Abdominal:     General: There is no distension.     Tenderness: There is no abdominal tenderness. There is no rebound.  Musculoskeletal:        General: No edema. Normal range of motion.     Cervical back: Neck supple.  Lymphadenopathy:     Cervical: No cervical adenopathy.  Skin:    Findings: No erythema or rash.  Neurological:     Mental Status: She is alert and oriented to person, place, and time.     Motor: No abnormal muscle tone.     Coordination: Coordination normal.  Psychiatric:        Mood and Affect: Mood and affect normal.        Behavior: Behavior normal.     ED Results / Procedures / Treatments   Labs (all labs ordered are listed, but only abnormal results are displayed) Labs Reviewed  CBC WITH DIFFERENTIAL/PLATELET - Abnormal; Notable for the following components:      Result Value   WBC 17.2 (*)    Neutro Abs 15.5 (*)    Abs Immature Granulocytes 0.31 (*)    All other components within normal limits  COMPREHENSIVE METABOLIC PANEL - Abnormal; Notable for the following components:   Glucose, Bld 139 (*)    ALT 52 (*)    All other components within normal limits  BRAIN NATRIURETIC PEPTIDE -  Abnormal; Notable for the following components:   B Natriuretic Peptide 106.0 (*)    All other components within normal limits  RESP PANEL BY RT-PCR (FLU A&B, COVID) ARPGX2  CULTURE, BLOOD (ROUTINE X 2)  CULTURE, BLOOD (ROUTINE X 2)    EKG EKG Interpretation  Date/Time:  Saturday April 06 2020 14:21:13 EST Ventricular Rate:  103 PR Interval:    QRS Duration: 87 QT Interval:  323 QTC Calculation: 423 R Axis:   91 Text Interpretation: Sinus tachycardia Right atrial  enlargement Probable lateral infarct, old Anteroseptal infarct, old Baseline wander in lead(s) V5 since last tracing in 2009, no significant changes seen Confirmed by Eber Hong ([id number]) on 04/06/2020 3:17:31 PM   Radiology DG Chest Port 1 View  Result Date: 04/06/2020 CLINICAL DATA:  Shortness of breath for 2 weeks.  History of COPD. EXAM: PORTABLE CHEST 1 VIEW COMPARISON:  CT 01/22/2020.  Radiographs 12/26/2019. FINDINGS: 1427 hours. The heart size and mediastinal contours are stable. The lungs are hyperinflated with diffuse central airway and interstitial thickening. Previously demonstrated left upper lobe nodule is grossly stable, although partly obscured by an EKG lead. There is a new small right pleural effusion with mild right basilar airspace disease which could reflect pneumonia. No pneumothorax. The bones appear unchanged. IMPRESSION: 1. New small right pleural effusion with mild right basilar airspace disease suggesting possible pneumonia. 2. Grossly stable left upper lobe pulmonary nodule compared with CT performed approximately 10 weeks ago. Please refer to follow-up recommendations from that study (chest CT follow-up next month). Electronically Signed   By: Carey Bullocks M.D.   On: 04/06/2020 14:37    Procedures Procedures   Medications Ordered in ED Medications  magnesium sulfate IVPB 2 g 50 mL ( Intravenous Rate/Dose Verify 04/06/20 1448)  0.9 %  sodium chloride infusion ( Intravenous Rate/Dose Verify  04/06/20 1441)  levofloxacin (LEVAQUIN) IVPB 500 mg (has no administration in time range)  methylPREDNISolone sodium succinate (SOLU-MEDROL) 125 mg/2 mL injection 125 mg (125 mg Intravenous Given 04/06/20 1435)  albuterol (VENTOLIN HFA) 108 (90 Base) MCG/ACT inhaler 2 puff (2 puffs Inhalation Given 04/06/20 1435)    ED Course  I have reviewed the triage vital signs and the nursing notes.  Pertinent labs & imaging results that were available during my care of the patient were reviewed by me and considered in my medical decision making (see chart for details).    MDM Rules/Calculators/A&P                          Patient with pneumonia and exacerbation of COPD.  I discussed admission because patient is mildly hypoxic.  She does not want to be admitted.  She will continue care in the emergency department.   Dr. Hyacinth Meeker will disposition patient Final Clinical Impression(s) / ED Diagnoses Final diagnoses:  None    Rx / DC Orders ED Discharge Orders    None       Bethann Berkshire, MD 04/11/20 1025

## 2020-04-06 NOTE — Discharge Instructions (Addendum)
Take prednisone daily for 5 days Albuterol every 4 hours for shortness of breath for 24 hours, then every 4 hours as needed Finish the Levaquin, once a day for 7 days  I have recommended that you be admitted to the hospital, you have declined and have chosen to go home, you may return at any time should you change your mind  Please follow-up with your doctor within 48 hours for recheck

## 2020-04-09 ENCOUNTER — Other Ambulatory Visit (HOSPITAL_COMMUNITY): Payer: Medicare HMO

## 2020-04-11 LAB — CULTURE, BLOOD (ROUTINE X 2)
Culture: NO GROWTH
Culture: NO GROWTH
Special Requests: ADEQUATE
Special Requests: ADEQUATE

## 2020-04-12 ENCOUNTER — Ambulatory Visit: Payer: Medicare HMO | Admitting: Internal Medicine

## 2020-04-12 ENCOUNTER — Ambulatory Visit: Payer: Medicare HMO | Admitting: Adult Health

## 2020-04-13 LAB — ACID FAST CULTURE WITH REFLEXED SENSITIVITIES (MYCOBACTERIA): Acid Fast Culture: NEGATIVE

## 2020-04-15 DIAGNOSIS — E782 Mixed hyperlipidemia: Secondary | ICD-10-CM | POA: Diagnosis not present

## 2020-04-15 DIAGNOSIS — I10 Essential (primary) hypertension: Secondary | ICD-10-CM | POA: Diagnosis not present

## 2020-04-15 DIAGNOSIS — E559 Vitamin D deficiency, unspecified: Secondary | ICD-10-CM | POA: Diagnosis not present

## 2020-04-15 DIAGNOSIS — E875 Hyperkalemia: Secondary | ICD-10-CM | POA: Diagnosis not present

## 2020-04-22 DIAGNOSIS — J449 Chronic obstructive pulmonary disease, unspecified: Secondary | ICD-10-CM | POA: Diagnosis not present

## 2020-04-22 DIAGNOSIS — Z716 Tobacco abuse counseling: Secondary | ICD-10-CM | POA: Diagnosis not present

## 2020-04-22 DIAGNOSIS — E782 Mixed hyperlipidemia: Secondary | ICD-10-CM | POA: Diagnosis not present

## 2020-04-22 DIAGNOSIS — E559 Vitamin D deficiency, unspecified: Secondary | ICD-10-CM | POA: Diagnosis not present

## 2020-04-22 DIAGNOSIS — E875 Hyperkalemia: Secondary | ICD-10-CM | POA: Diagnosis not present

## 2020-04-22 DIAGNOSIS — R944 Abnormal results of kidney function studies: Secondary | ICD-10-CM | POA: Diagnosis not present

## 2020-04-22 DIAGNOSIS — M25562 Pain in left knee: Secondary | ICD-10-CM | POA: Diagnosis not present

## 2020-04-22 DIAGNOSIS — I10 Essential (primary) hypertension: Secondary | ICD-10-CM | POA: Diagnosis not present

## 2020-04-22 DIAGNOSIS — R69 Illness, unspecified: Secondary | ICD-10-CM | POA: Diagnosis not present

## 2020-07-17 DIAGNOSIS — M940 Chondrocostal junction syndrome [Tietze]: Secondary | ICD-10-CM | POA: Diagnosis not present

## 2020-07-17 DIAGNOSIS — R1011 Right upper quadrant pain: Secondary | ICD-10-CM | POA: Diagnosis not present

## 2020-08-15 ENCOUNTER — Other Ambulatory Visit (HOSPITAL_COMMUNITY): Payer: Self-pay | Admitting: Family Medicine

## 2020-08-15 DIAGNOSIS — R1011 Right upper quadrant pain: Secondary | ICD-10-CM

## 2020-08-22 ENCOUNTER — Ambulatory Visit (HOSPITAL_COMMUNITY): Payer: Medicare HMO

## 2020-08-27 ENCOUNTER — Ambulatory Visit (HOSPITAL_COMMUNITY)
Admission: RE | Admit: 2020-08-27 | Discharge: 2020-08-27 | Disposition: A | Discharge status: Home / Self Care | Payer: Medicare HMO | Source: Ambulatory Visit | Attending: Family Medicine | Admitting: Family Medicine

## 2020-08-27 ENCOUNTER — Other Ambulatory Visit: Payer: Self-pay

## 2020-08-27 DIAGNOSIS — R1011 Right upper quadrant pain: Secondary | ICD-10-CM | POA: Insufficient documentation

## 2020-08-27 DIAGNOSIS — K824 Cholesterolosis of gallbladder: Secondary | ICD-10-CM | POA: Diagnosis not present

## 2020-08-27 DIAGNOSIS — N281 Cyst of kidney, acquired: Secondary | ICD-10-CM | POA: Diagnosis not present

## 2020-10-28 DIAGNOSIS — N390 Urinary tract infection, site not specified: Secondary | ICD-10-CM | POA: Diagnosis not present

## 2020-10-28 DIAGNOSIS — E782 Mixed hyperlipidemia: Secondary | ICD-10-CM | POA: Diagnosis not present

## 2020-10-28 DIAGNOSIS — R7303 Prediabetes: Secondary | ICD-10-CM | POA: Diagnosis not present

## 2020-11-04 DIAGNOSIS — Z0001 Encounter for general adult medical examination with abnormal findings: Secondary | ICD-10-CM | POA: Diagnosis not present

## 2020-11-04 DIAGNOSIS — N390 Urinary tract infection, site not specified: Secondary | ICD-10-CM | POA: Diagnosis not present

## 2020-11-04 DIAGNOSIS — R0989 Other specified symptoms and signs involving the circulatory and respiratory systems: Secondary | ICD-10-CM | POA: Diagnosis not present

## 2020-11-04 DIAGNOSIS — Z85828 Personal history of other malignant neoplasm of skin: Secondary | ICD-10-CM | POA: Diagnosis not present

## 2020-11-04 DIAGNOSIS — R69 Illness, unspecified: Secondary | ICD-10-CM | POA: Diagnosis not present

## 2020-11-04 DIAGNOSIS — I10 Essential (primary) hypertension: Secondary | ICD-10-CM | POA: Diagnosis not present

## 2020-11-04 DIAGNOSIS — R944 Abnormal results of kidney function studies: Secondary | ICD-10-CM | POA: Diagnosis not present

## 2020-11-04 DIAGNOSIS — E782 Mixed hyperlipidemia: Secondary | ICD-10-CM | POA: Diagnosis not present

## 2020-11-04 DIAGNOSIS — Z23 Encounter for immunization: Secondary | ICD-10-CM | POA: Diagnosis not present

## 2020-11-04 DIAGNOSIS — J449 Chronic obstructive pulmonary disease, unspecified: Secondary | ICD-10-CM | POA: Diagnosis not present

## 2020-11-05 ENCOUNTER — Other Ambulatory Visit: Payer: Self-pay | Admitting: Internal Medicine

## 2020-11-05 ENCOUNTER — Other Ambulatory Visit (HOSPITAL_COMMUNITY): Payer: Self-pay | Admitting: Internal Medicine

## 2020-11-05 DIAGNOSIS — R0989 Other specified symptoms and signs involving the circulatory and respiratory systems: Secondary | ICD-10-CM

## 2020-11-18 ENCOUNTER — Other Ambulatory Visit: Payer: Self-pay

## 2020-11-18 ENCOUNTER — Ambulatory Visit (HOSPITAL_COMMUNITY)
Admission: RE | Admit: 2020-11-18 | Discharge: 2020-11-18 | Disposition: A | Discharge status: Home / Self Care | Payer: Medicare HMO | Source: Ambulatory Visit | Attending: Internal Medicine | Admitting: Internal Medicine

## 2020-11-18 DIAGNOSIS — I6523 Occlusion and stenosis of bilateral carotid arteries: Secondary | ICD-10-CM | POA: Diagnosis not present

## 2020-11-18 DIAGNOSIS — R0989 Other specified symptoms and signs involving the circulatory and respiratory systems: Secondary | ICD-10-CM | POA: Diagnosis not present

## 2021-03-19 ENCOUNTER — Ambulatory Visit (HOSPITAL_COMMUNITY)
Admission: RE | Admit: 2021-03-19 | Discharge: 2021-03-19 | Disposition: A | Discharge status: Home / Self Care | Payer: Medicare HMO | Source: Ambulatory Visit | Attending: Family Medicine | Admitting: Family Medicine

## 2021-03-19 ENCOUNTER — Other Ambulatory Visit: Payer: Self-pay

## 2021-03-19 ENCOUNTER — Other Ambulatory Visit: Payer: Self-pay | Admitting: Family Medicine

## 2021-03-19 ENCOUNTER — Other Ambulatory Visit (HOSPITAL_COMMUNITY): Payer: Self-pay | Admitting: Family Medicine

## 2021-03-19 ENCOUNTER — Encounter (HOSPITAL_COMMUNITY): Payer: Self-pay | Admitting: Radiology

## 2021-03-19 DIAGNOSIS — R109 Unspecified abdominal pain: Secondary | ICD-10-CM

## 2021-03-19 DIAGNOSIS — R197 Diarrhea, unspecified: Secondary | ICD-10-CM | POA: Diagnosis not present

## 2021-03-19 DIAGNOSIS — K4020 Bilateral inguinal hernia, without obstruction or gangrene, not specified as recurrent: Secondary | ICD-10-CM | POA: Diagnosis not present

## 2021-03-19 DIAGNOSIS — R140 Abdominal distension (gaseous): Secondary | ICD-10-CM | POA: Diagnosis not present

## 2021-03-19 DIAGNOSIS — K7689 Other specified diseases of liver: Secondary | ICD-10-CM | POA: Diagnosis not present

## 2021-03-19 LAB — POCT I-STAT CREATININE: Creatinine, Ser: 0.9 mg/dL (ref 0.44–1.00)

## 2021-03-19 MED ORDER — IOHEXOL 300 MG/ML  SOLN
100.0000 mL | Freq: Once | INTRAMUSCULAR | Status: AC | PRN
  Administered 2021-03-19: 100 mL via INTRAVENOUS

## 2021-03-20 ENCOUNTER — Other Ambulatory Visit (HOSPITAL_COMMUNITY): Payer: Self-pay | Admitting: Family Medicine

## 2021-03-20 ENCOUNTER — Other Ambulatory Visit: Payer: Self-pay | Admitting: Family Medicine

## 2021-03-20 DIAGNOSIS — N281 Cyst of kidney, acquired: Secondary | ICD-10-CM

## 2021-03-20 DIAGNOSIS — M899 Disorder of bone, unspecified: Secondary | ICD-10-CM

## 2021-03-31 ENCOUNTER — Emergency Department (HOSPITAL_COMMUNITY): Payer: Medicare HMO

## 2021-03-31 ENCOUNTER — Encounter (HOSPITAL_COMMUNITY): Payer: Self-pay | Admitting: *Deleted

## 2021-03-31 ENCOUNTER — Encounter (HOSPITAL_COMMUNITY): Payer: Medicare HMO

## 2021-03-31 ENCOUNTER — Encounter (HOSPITAL_COMMUNITY): Payer: Self-pay

## 2021-03-31 ENCOUNTER — Emergency Department (HOSPITAL_COMMUNITY)
Admission: EM | Admit: 2021-03-31 | Discharge: 2021-03-31 | Disposition: A | Discharge status: Home / Self Care | Payer: Medicare HMO | Attending: Emergency Medicine | Admitting: Emergency Medicine

## 2021-03-31 DIAGNOSIS — J189 Pneumonia, unspecified organism: Secondary | ICD-10-CM

## 2021-03-31 DIAGNOSIS — R0602 Shortness of breath: Secondary | ICD-10-CM | POA: Diagnosis not present

## 2021-03-31 DIAGNOSIS — J449 Chronic obstructive pulmonary disease, unspecified: Secondary | ICD-10-CM | POA: Diagnosis not present

## 2021-03-31 DIAGNOSIS — J188 Other pneumonia, unspecified organism: Secondary | ICD-10-CM | POA: Diagnosis not present

## 2021-03-31 DIAGNOSIS — Z20822 Contact with and (suspected) exposure to covid-19: Secondary | ICD-10-CM | POA: Insufficient documentation

## 2021-03-31 DIAGNOSIS — R079 Chest pain, unspecified: Secondary | ICD-10-CM | POA: Diagnosis not present

## 2021-03-31 DIAGNOSIS — Z7982 Long term (current) use of aspirin: Secondary | ICD-10-CM | POA: Diagnosis not present

## 2021-03-31 DIAGNOSIS — R0789 Other chest pain: Secondary | ICD-10-CM | POA: Diagnosis not present

## 2021-03-31 DIAGNOSIS — R0781 Pleurodynia: Secondary | ICD-10-CM | POA: Diagnosis present

## 2021-03-31 DIAGNOSIS — J439 Emphysema, unspecified: Secondary | ICD-10-CM | POA: Diagnosis not present

## 2021-03-31 DIAGNOSIS — K760 Fatty (change of) liver, not elsewhere classified: Secondary | ICD-10-CM | POA: Diagnosis not present

## 2021-03-31 LAB — COMPREHENSIVE METABOLIC PANEL
ALT: 15 U/L (ref 0–44)
AST: 18 U/L (ref 15–41)
Albumin: 4 g/dL (ref 3.5–5.0)
Alkaline Phosphatase: 69 U/L (ref 38–126)
Anion gap: 12 (ref 5–15)
BUN: 14 mg/dL (ref 8–23)
CO2: 24 mmol/L (ref 22–32)
Calcium: 9.8 mg/dL (ref 8.9–10.3)
Chloride: 100 mmol/L (ref 98–111)
Creatinine, Ser: 0.91 mg/dL (ref 0.44–1.00)
GFR, Estimated: >60 mL/min (ref >60)
Glucose, Bld: 132 mg/dL — ABNORMAL HIGH (ref 70–99)
Potassium: 3.8 mmol/L (ref 3.5–5.1)
Sodium: 136 mmol/L (ref 135–145)
Total Bilirubin: 0.8 mg/dL (ref 0.3–1.2)
Total Protein: 7.6 g/dL (ref 6.5–8.1)

## 2021-03-31 LAB — CBC WITH DIFFERENTIAL/PLATELET
Abs Immature Granulocytes: 0.05 10*3/uL (ref 0.00–0.07)
Basophils Absolute: 0.1 10*3/uL (ref 0.0–0.1)
Basophils Relative: 0 %
Eosinophils Absolute: 0.3 10*3/uL (ref 0.0–0.5)
Eosinophils Relative: 1 %
HCT: 43.9 % (ref 36.0–46.0)
Hemoglobin: 14.6 g/dL (ref 12.0–15.0)
Immature Granulocytes: 0 %
Lymphocytes Relative: 4 %
Lymphs Abs: 1 10*3/uL (ref 0.7–4.0)
MCH: 32.2 pg (ref 26.0–34.0)
MCHC: 33.3 g/dL (ref 30.0–36.0)
MCV: 96.7 fL (ref 80.0–100.0)
Monocytes Absolute: 1.1 10*3/uL — ABNORMAL HIGH (ref 0.1–1.0)
Monocytes Relative: 5 %
Neutro Abs: 20.8 10*3/uL — ABNORMAL HIGH (ref 1.7–7.7)
Neutrophils Relative %: 90 %
Platelets: 241 10*3/uL (ref 150–400)
RBC: 4.54 MIL/uL (ref 3.87–5.11)
RDW: 13.5 % (ref 11.5–15.5)
WBC: 23.3 10*3/uL — ABNORMAL HIGH (ref 4.0–10.5)
nRBC: 0 % (ref 0.0–0.2)

## 2021-03-31 LAB — RESP PANEL BY RT-PCR (FLU A&B, COVID) ARPGX2
Influenza A by PCR: NEGATIVE
Influenza B by PCR: NEGATIVE
SARS Coronavirus 2 by RT PCR: NEGATIVE

## 2021-03-31 MED ORDER — IOHEXOL 350 MG/ML SOLN
80.0000 mL | Freq: Once | INTRAVENOUS | Status: AC | PRN
  Administered 2021-03-31: 80 mL via INTRAVENOUS

## 2021-03-31 MED ORDER — IPRATROPIUM-ALBUTEROL 0.5-2.5 (3) MG/3ML IN SOLN
3.0000 mL | Freq: Once | RESPIRATORY_TRACT | Status: AC
  Administered 2021-03-31: 3 mL via RESPIRATORY_TRACT
  Filled 2021-03-31: qty 3

## 2021-03-31 MED ORDER — SODIUM CHLORIDE 0.9 % IV SOLN
1.0000 g | Freq: Once | INTRAVENOUS | Status: AC
  Administered 2021-03-31: 1 g via INTRAVENOUS
  Filled 2021-03-31: qty 10

## 2021-03-31 MED ORDER — AMOXICILLIN-POT CLAVULANATE 875-125 MG PO TABS: 1.0000 | ORAL_TABLET | Freq: Two times a day (BID) | ORAL | 0 refills | Status: DC

## 2021-03-31 MED ORDER — SODIUM CHLORIDE 0.9 % IV SOLN
500.0000 mg | Freq: Once | INTRAVENOUS | Status: AC
  Administered 2021-03-31: 500 mg via INTRAVENOUS
  Filled 2021-03-31: qty 5

## 2021-03-31 NOTE — ED Triage Notes (Signed)
Pain in right chest area worse with movement

## 2021-03-31 NOTE — ED Notes (Signed)
Patient's oxygen is 85% on room air without exertion. This nurse educated the patient on the importance of staying in the hospital for treatment. Patient refuses to stay in the hospital for treatment and states "I want to go home, take my antibiotics, and do nothing."

## 2021-03-31 NOTE — Discharge Instructions (Addendum)
You are leaving against our advice.  Feel free to return at any time for further treatment.  Follow-up with your doctors as soon as possible.

## 2021-03-31 NOTE — ED Notes (Signed)
Patient transported to CT 

## 2021-04-07 NOTE — ED Provider Notes (Signed)
[facility] EMERGENCY DEPARTMENT Provider Note   CSN: [id number] Arrival date & time: 03/31/21  1149     History  Chief Complaint  Patient presents with   Chest Pain    Karen Church is a 71 y.o. female.   Chest Pain Associated symptoms: cough   Patient with history of severe COPD.  Has had right-sided chest pain over the last few days.  Pleuritic.  Hypoxic on room air.   Past Medical History:  Diagnosis Date   Carpal tunnel syndrome    COPD (chronic obstructive pulmonary disease) (HCC)     Home Medications Prior to Admission medications   Medication Sig Start Date End Date Taking? Authorizing Provider  amoxicillin-clavulanate (AUGMENTIN) 875-125 MG tablet Take 1 tablet by mouth every 12 (twelve) hours. 03/31/21  Yes Benjiman Core, MD  albuterol (PROVENTIL) (5 MG/ML) 0.5% nebulizer solution Take 0.5 mLs (2.5 mg total) by nebulization every 6 (six) hours as needed for wheezing or shortness of breath. 04/06/20   Eber Hong, MD  Ascorbic Acid (VITAMIN C PO) Take 1 tablet by mouth daily.    [provider]  aspirin EC 81 MG tablet Take 81 mg by mouth daily.     [provider]  benzonatate (TESSALON) 200 MG capsule Take 1 capsule by mouth every 12 (twelve) hours as needed for cough. 04/03/20   [provider]  levofloxacin (LEVAQUIN) 750 MG tablet Take 1 tablet (750 mg total) by mouth daily. 04/06/20   Eber Hong, MD  Multiple Vitamin (MULTIVITAMIN WITH MINERALS) TABS Take 1 tablet by mouth daily.    [provider]  naproxen sodium (ANAPROX) 220 MG tablet Take 220-440 mg by mouth daily as needed (pain).     [provider]  predniSONE (DELTASONE) 20 MG tablet Take 2 tablets (40 mg total) by mouth daily. 04/06/20   Eber Hong, MD  TRELEGY ELLIPTA 100-62.5-25 MCG/INH AEPB Inhale 1 puff into the lungs daily. 12/21/19   [provider]      Allergies    Lasix [furosemide]    Review of Systems   Review of Systems   Respiratory:  Positive for cough.   Cardiovascular:  Positive for chest pain.   Physical Exam Updated Vital Signs BP (!) 124/55    Pulse 91    Temp 99.4 F (37.4 C) (Oral)    Resp (!) 23    SpO2 98%  Physical Exam Pulmonary:     Comments: Diffuse harsh breath sounds.  Prolonged expirations.   ED Results / Procedures / Treatments   Labs (all labs ordered are listed, but only abnormal results are displayed) Labs Reviewed  COMPREHENSIVE METABOLIC PANEL - Abnormal; Notable for the following components:      Result Value   Glucose, Bld 132 (*)    All other components within normal limits  CBC WITH DIFFERENTIAL/PLATELET - Abnormal; Notable for the following components:   WBC 23.3 (*)    Neutro Abs 20.8 (*)    Monocytes Absolute 1.1 (*)    All other components within normal limits  RESP PANEL BY RT-PCR (FLU A&B, COVID) [id number]    EKG EKG Interpretation  Date/Time:  Monday March 31 2021 12:36:03 EST Ventricular Rate:  104 PR Interval:  144 QRS Duration: 96 QT Interval:  343 QTC Calculation: 452 R Axis:   65 Text Interpretation: Sinus tachycardia LAE, consider biatrial enlargement Anteroseptal infarct, old No significant change since last tracing Confirmed by Melene Plan [phone]) on 04/01/2021 8:23:42 PM  Radiology No  results found.  Procedures Procedures    Medications Ordered in ED Medications  ipratropium-albuterol (DUONEB) 0.5-2.5 (3) MG/3ML nebulizer solution 3 mL (3 mLs Nebulization Given 03/31/21 1242)  cefTRIAXone (ROCEPHIN) 1 g in sodium chloride 0.9 % 100 mL IVPB (0 g Intravenous Stopped 03/31/21 1339)  azithromycin (ZITHROMAX) 500 mg in sodium chloride 0.9 % 250 mL IVPB (0 mg Intravenous Stopped 03/31/21 1533)  iohexol (OMNIPAQUE) 350 MG/ML injection 80 mL (80 mLs Intravenous Contrast Given 03/31/21 1350)    ED Course/ Medical Decision Making/ A&P                           Medical Decision Making Amount and/or Complexity of Data Reviewed Labs:  ordered. Radiology: ordered.  Risk Prescription drug management.  Patient presents with some shortness of breath and pleuritic chest pain.  History of COPD.  Worsening hypoxia.  CT scan done and independently interpreted by me.  Showed multifocal pneumonia.  Also had some mediastinal nodes that have increased in size.  Will need follow-up.  With worsening hypoxia and COPD history I recommended admission to the hospital, however patient was not willing to stay.  Aware of risks.  Started on antibiotics and will follow-up with PCP and is free to return at any time.  Discharge home AGAINST MEDICAL ADVICE.        Final Clinical Impression(s) / ED Diagnoses Final diagnoses:  Multifocal pneumonia    Rx / DC Orders ED Discharge Orders          Ordered    amoxicillin-clavulanate (AUGMENTIN) 875-125 MG tablet  Every 12 hours        03/31/21 1507              Benjiman Core, MD 04/07/21 1944

## 2021-04-15 DIAGNOSIS — J189 Pneumonia, unspecified organism: Secondary | ICD-10-CM | POA: Diagnosis not present

## 2021-04-15 DIAGNOSIS — J449 Chronic obstructive pulmonary disease, unspecified: Secondary | ICD-10-CM | POA: Diagnosis not present

## 2021-04-17 ENCOUNTER — Encounter (HOSPITAL_COMMUNITY): Payer: Self-pay

## 2021-04-17 ENCOUNTER — Ambulatory Visit (HOSPITAL_COMMUNITY)
Admission: RE | Admit: 2021-04-17 | Discharge: 2021-04-17 | Disposition: A | Discharge status: Home / Self Care | Payer: Medicare HMO | Source: Ambulatory Visit | Attending: Family Medicine | Admitting: Family Medicine

## 2021-04-17 ENCOUNTER — Other Ambulatory Visit: Payer: Self-pay

## 2021-04-17 DIAGNOSIS — M899 Disorder of bone, unspecified: Secondary | ICD-10-CM | POA: Insufficient documentation

## 2021-04-17 DIAGNOSIS — R0781 Pleurodynia: Secondary | ICD-10-CM | POA: Diagnosis not present

## 2021-04-17 DIAGNOSIS — M19031 Primary osteoarthritis, right wrist: Secondary | ICD-10-CM | POA: Diagnosis not present

## 2021-04-17 MED ORDER — TECHNETIUM TC 99M MEDRONATE IV KIT
20.0000 | PACK | Freq: Once | INTRAVENOUS | Status: AC | PRN
  Administered 2021-04-17: 10:00:00 21 via INTRAVENOUS

## 2021-04-22 DIAGNOSIS — J449 Chronic obstructive pulmonary disease, unspecified: Secondary | ICD-10-CM | POA: Diagnosis not present

## 2021-04-22 DIAGNOSIS — J189 Pneumonia, unspecified organism: Secondary | ICD-10-CM | POA: Diagnosis not present

## 2021-05-02 DIAGNOSIS — E782 Mixed hyperlipidemia: Secondary | ICD-10-CM | POA: Diagnosis not present

## 2021-05-05 DIAGNOSIS — R1310 Dysphagia, unspecified: Secondary | ICD-10-CM | POA: Diagnosis not present

## 2021-05-05 DIAGNOSIS — R944 Abnormal results of kidney function studies: Secondary | ICD-10-CM | POA: Diagnosis not present

## 2021-05-05 DIAGNOSIS — I10 Essential (primary) hypertension: Secondary | ICD-10-CM | POA: Diagnosis not present

## 2021-05-05 DIAGNOSIS — R0989 Other specified symptoms and signs involving the circulatory and respiratory systems: Secondary | ICD-10-CM | POA: Diagnosis not present

## 2021-05-05 DIAGNOSIS — E782 Mixed hyperlipidemia: Secondary | ICD-10-CM | POA: Diagnosis not present

## 2021-05-05 DIAGNOSIS — J449 Chronic obstructive pulmonary disease, unspecified: Secondary | ICD-10-CM | POA: Diagnosis not present

## 2021-05-05 DIAGNOSIS — R69 Illness, unspecified: Secondary | ICD-10-CM | POA: Diagnosis not present

## 2021-05-14 ENCOUNTER — Other Ambulatory Visit (HOSPITAL_COMMUNITY): Payer: Self-pay | Admitting: Family Medicine

## 2021-05-14 ENCOUNTER — Other Ambulatory Visit: Payer: Self-pay | Admitting: Family Medicine

## 2021-05-14 DIAGNOSIS — J189 Pneumonia, unspecified organism: Secondary | ICD-10-CM

## 2021-05-19 DIAGNOSIS — H524 Presbyopia: Secondary | ICD-10-CM | POA: Diagnosis not present

## 2021-05-19 DIAGNOSIS — Z0100 Encounter for examination of eyes and vision without abnormal findings: Secondary | ICD-10-CM | POA: Diagnosis not present

## 2021-07-02 ENCOUNTER — Encounter: Payer: Self-pay | Admitting: Gastroenterology

## 2021-07-02 ENCOUNTER — Ambulatory Visit: Payer: Medicare HMO | Admitting: Gastroenterology

## 2021-07-02 VITALS — BP 138/70 | HR 93 | Temp 97.3°F | Ht 62.0 in | Wt 105.4 lb

## 2021-07-02 DIAGNOSIS — R634 Abnormal weight loss: Secondary | ICD-10-CM

## 2021-07-02 DIAGNOSIS — R109 Unspecified abdominal pain: Secondary | ICD-10-CM | POA: Diagnosis not present

## 2021-07-02 DIAGNOSIS — R1310 Dysphagia, unspecified: Secondary | ICD-10-CM

## 2021-07-02 DIAGNOSIS — K921 Melena: Secondary | ICD-10-CM | POA: Diagnosis not present

## 2021-07-02 NOTE — Patient Instructions (Signed)
Please have blood work done.  I have ordered a special scan of your abdomen to check the main blood vessels.  We are also arranging an endoscopy with dilation due to problems swallowing and history of black stool.  Further recommendations to follow!  It was a pleasure to see you today. I want to create trusting relationships with patients to provide genuine, compassionate, and quality care. I value your feedback. If you receive a survey regarding your visit,  I greatly appreciate you taking time to fill this out.   Gelene Mink, PhD, ANP-BC [facility]

## 2021-07-02 NOTE — Progress Notes (Signed)
Gastroenterology Office Note     Primary Care Physician:  Benita Stabile, MD  Primary Gastroenterologist: Dr. Jena Gauss    Chief Complaint   Chief Complaint  Patient presents with   Abdominal Pain    States that she is unable to eat, her stools are black and there has been blood and mucous in her stools. Her stools are loose.      History of Present Illness   RAYLEA ADCOX is a 71 y.o. female presenting today in follow-up with a history of dysphagia and weight loss, last seen in Feb 2018. Colonoscopy in 2017 normal; EGD in 2017 with normal esophagus s/p dilation and H.pylori gastritis.     Since end of last year has had cramping/pain with eating. Lower abdominal pain. Black stool associated.  Had blood tinged mucus but now back to normal. Not passing any black stool currently. Now passing brown stool now. Notes pain with eating. Has had unexplained weight loss. Used to weigh 115/116  now 105. No food aversion. Wants to eat. Has to have a taste for something in order to eat it.    Will have cramping after eating and urge to have BM. Will have soft stool. Takes Aleve for headaches. No typical GERD symptoms. Notes solid food dysphagia. Has to drink lots of water to get it down.    CT in Feb 2023 with extensive arterial calficiations with plaques in aorta and its major branches.   Past Medical History:  Diagnosis Date   Carpal tunnel syndrome    COPD (chronic obstructive pulmonary disease) (HCC)     Past Surgical History:  Procedure Laterality Date   ABDOMINAL HYSTERECTOMY     BIOPSY  12/10/2015   Procedure: BIOPSY;  Surgeon: Corbin Ade, MD;  Location: [facility];  Service: Endoscopy;;  Gastric    CESAREAN SECTION     COLONOSCOPY N/A 12/10/2015   normal   ESOPHAGOGASTRODUODENOSCOPY N/A 12/10/2015   normal esophagus s/p dilation, +H.pylori   MALONEY DILATION N/A 12/10/2015   Procedure: [name] DILATION;  Surgeon: Corbin Ade, MD;  Location: [facility];   Service: Endoscopy;  Laterality: N/A;   SKIN CANCER EXCISION     SMALL INTESTINE SURGERY N/A 1967   Resection    Current Outpatient Medications  Medication Sig Dispense Refill   albuterol (PROVENTIL) (5 MG/ML) 0.5% nebulizer solution Take 0.5 mLs (2.5 mg total) by nebulization every 6 (six) hours as needed for wheezing or shortness of breath. 20 mL 12   Ascorbic Acid (VITAMIN C PO) Take 1 tablet by mouth daily.     aspirin EC 81 MG tablet Take 81 mg by mouth daily.      Multiple Vitamin (MULTIVITAMIN WITH MINERALS) TABS Take 1 tablet by mouth daily.     TRELEGY ELLIPTA 100-62.5-25 MCG/INH AEPB Inhale 1 puff into the lungs daily.     albuterol (VENTOLIN HFA) 108 (90 Base) MCG/ACT inhaler Inhale 2 puffs into the lungs every 4 (four) hours as needed for shortness of breath.     amoxicillin-clavulanate (AUGMENTIN) 875-125 MG tablet Take 1 tablet by mouth every 12 (twelve) hours. 14 tablet 0   No current facility-administered medications for this visit.    Allergies as of 07/02/2021 - Review Complete 07/02/2021  Allergen Reaction Noted   Lasix [furosemide] Swelling 09/14/2018    Family History  Problem Relation Age of Onset   Colon polyps Sister    Colon polyps Brother    Colon cancer Neg Hx  Social History   Socioeconomic History   Marital status: Married    Spouse name: Not on file   Number of children: Not on file   Years of education: Not on file   Highest education level: Not on file  Occupational History   Not on file  Tobacco Use   Smoking status: Every Day    Packs/day: 0.50    Years: 50.00    Total pack years: 25.00    Types: Cigarettes   Smokeless tobacco: Never   Tobacco comments:    currently at 1/2 ppd 12/26/19//lmr  Vaping Use   Vaping Use: Never used  Substance and Sexual Activity   Alcohol use: No   Drug use: No   Sexual activity: Yes  Other Topics Concern   Not on file  Social History Narrative   Not on file   Social Determinants of Health    Financial Resource Strain: Not on file  Food Insecurity: Not on file  Transportation Needs: Not on file  Physical Activity: Not on file  Stress: Not on file  Social Connections: Not on file  Intimate Partner Violence: Not on file     Review of Systems   Gen: Denies any fever, chills, fatigue, weight loss, lack of appetite.  CV: Denies chest pain, heart palpitations, peripheral edema, syncope.  Resp: Denies shortness of breath at rest or with exertion. Denies wheezing or cough.  GI: see HPI GU : Denies urinary burning, urinary frequency, urinary hesitancy MS: Denies joint pain, muscle weakness, cramps, or limitation of movement.  Derm: Denies rash, itching, dry skin Psych: Denies depression, anxiety, memory loss, and confusion Heme: Denies bruising, bleeding, and enlarged lymph nodes.   Physical Exam   BP 138/70 (BP Location: Right Arm, Patient Position: Sitting, Cuff Size: Normal)   Pulse 93   Temp (!) 97.3 F (36.3 C) (Temporal)   Ht 5\' 2"  (1.575 m)   Wt 105 lb 6.4 oz (47.8 kg)   SpO2 (!) 89%   BMI 19.28 kg/m  General:   Alert and oriented. Pleasant and cooperative. Well-nourished and well-developed.  Head:  Normocephalic and atraumatic. Eyes:  Without icterus Lungs: coarse, scattered rhonchi Abdomen:  +BS, soft, non-tender and non-distended. No HSM noted. No guarding or rebound. No masses appreciated.  Rectal:  Deferred  Msk:  Symmetrical without gross deformities. Normal posture. Extremities:  Without edema. Neurologic:  Alert and  oriented x4;  grossly normal neurologically. Skin:  Intact without significant lesions or rashes. Psych:  Alert and cooperative. Normal mood and affect.   Assessment   Leandra KernMary J Wardrop is a 71 y.o. female presenting today in follow-up with a history of  dysphagia and weight loss, last seen in Feb 2018. Colonoscopy in 2017 normal; EGD in 2017 with normal esophagus s/p dilation and H.pylori gastritis. Now noting abdominal pain and  concern for black stool in the past.   Abdominal pain: noted postprandially. Associated unexplained weight loss of around 10 lbs. Denying any food aversion. Several risk factors predisposing to chronic mesenteric ischemia. Also with concerns for black stool in the past but none currently. Pursuing CTA expeditiously and will also need EGD. AT time of EGD recommend biopsies of stomach as she has history of H.pylori s/p treatment but eradication has not been documented.   Solid food dysphagia: prior dilation in 2017 was helpful. Recommend dilation at time of EGD.   Due to her constellation of symptoms, I have requested with the provider who can do this the soonest per  scheduling.   PLAN    CTA this week or next at latest Proceed with upper endoscopy/dilation by Dr. Marletta Lor in near future: the risks, benefits, and alternatives have been discussed with the patient in detail. The patient states understanding and desires to proceed.  Check CBC today due to reported history of melena Further recommendations to follow   Gelene Mink, PhD, ANP-BC [facility]

## 2021-07-03 ENCOUNTER — Encounter: Payer: Self-pay | Admitting: *Deleted

## 2021-07-03 ENCOUNTER — Telehealth: Payer: Self-pay | Admitting: *Deleted

## 2021-07-03 DIAGNOSIS — K921 Melena: Secondary | ICD-10-CM | POA: Diagnosis not present

## 2021-07-03 NOTE — Telephone Encounter (Signed)
Called pt. Scheduled EGD/DIL with soonest provider asa 3 Dr. Marletta Lor on 6/8 at 8am. Aware will mail instructions with pre-op appt.

## 2021-07-04 LAB — CBC WITH DIFFERENTIAL/PLATELET
Absolute Monocytes: 473 cells/uL (ref 200–950)
Basophils Absolute: 64 cells/uL (ref 0–200)
Basophils Relative: 0.7 %
Eosinophils Absolute: 237 cells/uL (ref 15–500)
Eosinophils Relative: 2.6 %
HCT: 45.8 % — ABNORMAL HIGH (ref 35.0–45.0)
Hemoglobin: 15.3 g/dL (ref 11.7–15.5)
Lymphs Abs: 4150 cells/uL — ABNORMAL HIGH (ref 850–3900)
MCH: 32 pg (ref 27.0–33.0)
MCHC: 33.4 g/dL (ref 32.0–36.0)
MCV: 95.8 fL (ref 80.0–100.0)
MPV: 9.8 fL (ref 7.5–12.5)
Monocytes Relative: 5.2 %
Neutro Abs: 4177 cells/uL (ref 1500–7800)
Neutrophils Relative %: 45.9 %
Platelets: 405 10*3/uL — ABNORMAL HIGH (ref 140–400)
RBC: 4.78 10*6/uL (ref 3.80–5.10)
RDW: 12.9 % (ref 11.0–15.0)
Total Lymphocyte: 45.6 %
WBC: 9.1 10*3/uL (ref 3.8–10.8)

## 2021-07-04 LAB — BASIC METABOLIC PANEL
BUN: 17 mg/dL (ref 7–25)
CO2: 30 mmol/L (ref 20–32)
Calcium: 10.3 mg/dL (ref 8.6–10.4)
Chloride: 103 mmol/L (ref 98–110)
Creat: 0.83 mg/dL (ref 0.60–1.00)
Glucose, Bld: 80 mg/dL (ref 65–99)
Potassium: 4.5 mmol/L (ref 3.5–5.3)
Sodium: 143 mmol/L (ref 135–146)

## 2021-07-09 ENCOUNTER — Telehealth: Payer: Self-pay | Admitting: *Deleted

## 2021-07-09 NOTE — Telephone Encounter (Signed)
Spoke with pt and she is aware of appt details.  

## 2021-07-09 NOTE — Telephone Encounter (Signed)
PA for CTA approved. Auth#  [id number], valid 5/31-11/27/23  CTA scheduled for 6/6, arrival 10am, liquids only 4 hrs prior  LMOVM with appt details

## 2021-07-10 ENCOUNTER — Emergency Department (HOSPITAL_COMMUNITY): Payer: Medicare HMO

## 2021-07-10 ENCOUNTER — Other Ambulatory Visit: Payer: Self-pay

## 2021-07-10 ENCOUNTER — Emergency Department (HOSPITAL_COMMUNITY)
Admission: EM | Admit: 2021-07-10 | Discharge: 2021-07-11 | Discharge status: Against Medical Advice | Payer: Medicare HMO | Attending: Emergency Medicine | Admitting: Emergency Medicine

## 2021-07-10 ENCOUNTER — Encounter (HOSPITAL_COMMUNITY): Payer: Self-pay | Admitting: Emergency Medicine

## 2021-07-10 DIAGNOSIS — Z7982 Long term (current) use of aspirin: Secondary | ICD-10-CM | POA: Insufficient documentation

## 2021-07-10 DIAGNOSIS — E871 Hypo-osmolality and hyponatremia: Secondary | ICD-10-CM | POA: Insufficient documentation

## 2021-07-10 DIAGNOSIS — Z5329 Procedure and treatment not carried out because of patient's decision for other reasons: Secondary | ICD-10-CM | POA: Insufficient documentation

## 2021-07-10 DIAGNOSIS — J449 Chronic obstructive pulmonary disease, unspecified: Secondary | ICD-10-CM | POA: Insufficient documentation

## 2021-07-10 DIAGNOSIS — J189 Pneumonia, unspecified organism: Secondary | ICD-10-CM | POA: Diagnosis not present

## 2021-07-10 DIAGNOSIS — J90 Pleural effusion, not elsewhere classified: Secondary | ICD-10-CM | POA: Insufficient documentation

## 2021-07-10 DIAGNOSIS — R0602 Shortness of breath: Secondary | ICD-10-CM | POA: Diagnosis not present

## 2021-07-10 LAB — BASIC METABOLIC PANEL
Anion gap: 7 (ref 5–15)
BUN: 13 mg/dL (ref 8–23)
CO2: 24 mmol/L (ref 22–32)
Calcium: 8.7 mg/dL — ABNORMAL LOW (ref 8.9–10.3)
Chloride: 102 mmol/L (ref 98–111)
Creatinine, Ser: 0.9 mg/dL (ref 0.44–1.00)
GFR, Estimated: >60 mL/min (ref >60)
Glucose, Bld: 145 mg/dL — ABNORMAL HIGH (ref 70–99)
Potassium: 3.5 mmol/L (ref 3.5–5.1)
Sodium: 133 mmol/L — ABNORMAL LOW (ref 135–145)

## 2021-07-10 LAB — CBC
HCT: 37.2 % (ref 36.0–46.0)
Hemoglobin: 12.4 g/dL (ref 12.0–15.0)
MCH: 32.2 pg (ref 26.0–34.0)
MCHC: 33.3 g/dL (ref 30.0–36.0)
MCV: 96.6 fL (ref 80.0–100.0)
Platelets: 228 10*3/uL (ref 150–400)
RBC: 3.85 MIL/uL — ABNORMAL LOW (ref 3.87–5.11)
RDW: 13.3 % (ref 11.5–15.5)
WBC: 18.1 10*3/uL — ABNORMAL HIGH (ref 4.0–10.5)
nRBC: 0 % (ref 0.0–0.2)

## 2021-07-10 LAB — LACTIC ACID, PLASMA: Lactic Acid, Venous: 0.8 mmol/L (ref 0.5–1.9)

## 2021-07-10 MED ORDER — SODIUM CHLORIDE 0.9 % IV BOLUS
1000.0000 mL | Freq: Once | INTRAVENOUS | Status: AC
  Administered 2021-07-10: 1000 mL via INTRAVENOUS

## 2021-07-10 MED ORDER — AMOXICILLIN-POT CLAVULANATE 875-125 MG PO TABS: 1.0000 | ORAL_TABLET | Freq: Two times a day (BID) | ORAL | 0 refills | Status: DC

## 2021-07-10 MED ORDER — ALBUTEROL SULFATE HFA 108 (90 BASE) MCG/ACT IN AERS: 2.0000 | INHALATION_SPRAY | RESPIRATORY_TRACT | Status: DC | PRN

## 2021-07-10 MED ORDER — SODIUM CHLORIDE 0.9 % IV SOLN
500.0000 mg | Freq: Once | INTRAVENOUS | Status: AC
  Administered 2021-07-10: 500 mg via INTRAVENOUS
  Filled 2021-07-10: qty 5

## 2021-07-10 MED ORDER — CEFTRIAXONE SODIUM 1 G IJ SOLR
1.0000 g | Freq: Once | INTRAMUSCULAR | Status: AC
  Administered 2021-07-10: 1 g via INTRAVENOUS
  Filled 2021-07-10: qty 10

## 2021-07-10 NOTE — ED Triage Notes (Addendum)
Pt c/o sob x 2 days. States "I keep pneumonia". States her sats at home has been 88%. Pt c/o pain under right breast intermittent but not at this time.  Hurts worse with coughing and deep breathing. Coughing up yellow sputum. Does not wear 02 at home. A/o. Ambulatory. C/o has been losing weight and no appetite, endo scheduled for next week. Able to complete sentences. NAD.

## 2021-07-10 NOTE — Discharge Instructions (Addendum)
You were seen in the emergency department for shortness of breath.  Your chest x-ray shows you have pneumonia. We discussed staying in the hospital and you have declined this against medical advice.   I have sent a prescription of antibiotics for you to continue to your pharmacy. It is important you finish the entire course and follow-up with your primary care doctor to see if your symptoms are improving.

## 2021-07-10 NOTE — ED Provider Notes (Cosign Needed)
[facility] EMERGENCY DEPARTMENT Provider Note   CSN: [id number] Arrival date & time: 07/10/21  2039     History  Chief Complaint  Patient presents with   Shortness of Breath    Karen Church is a 71 y.o. female who presents emergency department complaining of productive cough, pleuritic chest pain, and shortness of breath for the past 2 days.  Patient reports a significant history of multiple bouts of pneumonia, most recently back in February.  She states she was checking her oxygen saturation at home and it was 88% on room air, and she does not chronically wear oxygen.  Believes that she had a fever, did not check her temperature but felt as though fever broke with some medicine at home.  She also is complaining about significant weight loss in the past several months, and has an endoscopy scheduled for next week.   Shortness of Breath Associated symptoms: cough and fever       Home Medications Prior to Admission medications   Medication Sig Start Date End Date Taking? Authorizing Provider  albuterol (PROVENTIL) (5 MG/ML) 0.5% nebulizer solution Take 0.5 mLs (2.5 mg total) by nebulization every 6 (six) hours as needed for wheezing or shortness of breath. 04/06/20   Noemi Chapel, MD  amoxicillin-clavulanate (AUGMENTIN) 875-125 MG tablet Take 1 tablet by mouth every 12 (twelve) hours. 07/10/21  Yes Arius Harnois T, PA-C  Ascorbic Acid (VITAMIN C PO) Take 1 tablet by mouth daily.    [provider]  aspirin EC 81 MG tablet Take 81 mg by mouth daily.     [provider]  Multiple Vitamin (MULTIVITAMIN WITH MINERALS) TABS Take 1 tablet by mouth daily.    [provider]  TRELEGY ELLIPTA 100-62.5-25 MCG/INH AEPB Inhale 1 puff into the lungs daily. 12/21/19   [provider]      Allergies    Lasix [furosemide]    Review of Systems   Review of Systems  Constitutional:  Positive for chills and fever.  Respiratory:  Positive for cough and  shortness of breath.        Pleuritic CP  All other systems reviewed and are negative.  Physical Exam Updated Vital Signs BP (!) 122/56   Pulse 80   Temp 97.9 F (36.6 C) (Oral)   Resp (!) 23   Ht 5\' 2"  (1.575 m)   Wt 47.8 kg   SpO2 98%   BMI 19.28 kg/m  Physical Exam Vitals and nursing note reviewed.  Constitutional:      Appearance: Normal appearance.  HENT:     Head: Normocephalic and atraumatic.  Eyes:     Conjunctiva/sclera: Conjunctivae normal.  Cardiovascular:     Rate and Rhythm: Normal rate and regular rhythm.  Pulmonary:     Effort: Pulmonary effort is normal. No accessory muscle usage or respiratory distress.     Comments: Distant lung sounds Abdominal:     General: There is no distension.     Palpations: Abdomen is soft.     Tenderness: There is no abdominal tenderness.  Skin:    General: Skin is warm and dry.  Neurological:     General: No focal deficit present.     Mental Status: She is alert.    ED Results / Procedures / Treatments   Labs (all labs ordered are listed, but only abnormal results are displayed) Labs Reviewed  BASIC METABOLIC PANEL - Abnormal; Notable for the following components:      Result Value  Sodium 133 (*)    Glucose, Bld 145 (*)    Calcium 8.7 (*)    All other components within normal limits  CBC - Abnormal; Notable for the following components:   WBC 18.1 (*)    RBC 3.85 (*)    All other components within normal limits  LACTIC ACID, PLASMA    EKG EKG Interpretation  Date/Time:  Thursday July 10 2021 21:29:31 EDT Ventricular Rate:  94 PR Interval:  155 QRS Duration: 86 QT Interval:  347 QTC Calculation: 434 R Axis:   89 Text Interpretation: Sinus rhythm Biatrial enlargement Anteroseptal infarct, age indeterminate Confirmed by Ripley Fraise [id number]) on 07/10/2021 11:43:38 PM  Radiology DG Chest 2 View  Result Date: 07/10/2021 CLINICAL DATA:  Shortness of breath. EXAM: CHEST - 2 VIEW COMPARISON:  Chest x-ray  03/31/2021 FINDINGS: There are new patchy airspace opacities throughout the right middle lobe. There is a small right pleural effusion. Lungs are hyperinflated, unchanged. Cardiomediastinal silhouette is within normal limits. No pneumothorax. No acute fractures. IMPRESSION: 1. New right middle lobe airspace disease worrisome for pneumonia. Recommend follow-up chest x-ray in 4-6 weeks to confirm resolution. 2. New small right pleural effusion. Electronically Signed   By: Ronney Asters M.D.   On: 07/10/2021 21:28    Procedures Procedures    Medications Ordered in ED Medications  albuterol (VENTOLIN HFA) 108 (90 Base) MCG/ACT inhaler 2 puff (has no administration in time range)  azithromycin (ZITHROMAX) 500 mg in sodium chloride 0.9 % 250 mL IVPB (has no administration in time range)  sodium chloride 0.9 % bolus 1,000 mL (1,000 mLs Intravenous New Bag/Given 07/10/21 2256)  cefTRIAXone (ROCEPHIN) 1 g in sodium chloride 0.9 % 100 mL IVPB (1 g Intravenous New Bag/Given 07/10/21 2258)    ED Course/ Medical Decision Making/ A&P                           Medical Decision Making Amount and/or Complexity of Data Reviewed Labs: ordered. Radiology: ordered.  Risk Prescription drug management.  This patient is a 71 y.o. female  who presents to the ED for concern of shortness of breath and productive cough.   Differential diagnoses prior to evaluation: The emergent differential diagnosis includes, but is not limited to,  Upper respiratory infection, lower respiratory infection, allergies, asthma, irritants, foreign body, medications (ACE inhibitors), reflux, asthma, CHF, lung cancer, interstitial lung disease, psychiatric causes, postnasal drip. This is not an exhaustive differential.   Past Medical History / Co-morbidities: COPD  Additional history: Chart reviewed. Pertinent results include: Patient seen at this facility on 2/20 with multifocal pneumonia.  She refused hospitalization at that time,  and left AMA.   Physical Exam: Physical exam performed. The pertinent findings include: Oxygen saturation 92% on room air. Afebrile. Distant lung sounds, no adventitious sounds.   Lab Tests/Imaging studies: I Ordered, and personally interpreted labs/imaging and the pertinent results include:  leukocytosis of 18,100. Normal hemoglobin. Mild hyponatremia of 133, glucose of 145, otherwise electrolytes WNL. Chest x-ray showed new right middle lobe airspace disease with new small right pleural effusion. I agree with the radiologist interpretation.   Medications: I ordered medication including empiric antibiotics  for pneumonia.  I have reviewed the patients home medicines and have made adjustments as needed.   Disposition: After consideration of the diagnostic results and the patients response to treatment, I feel that patient would benefit from medical admission for pneumonia. Due to her history of COPD and  hypoxia at home, I believe she meets inpatient criteria. I discussed this with the patient and she is adamant about not being admitted and being discharged home. She voices understanding that doing so would be AGAINST MEDICAL ADVICE. Will finish her antibiotics and discharge to home with extended course. I recommended follow up with PCP.   Final Clinical Impression(s) / ED Diagnoses Final diagnoses:  Community acquired pneumonia of right middle lobe of lung    Rx / DC Orders ED Discharge Orders          Ordered    amoxicillin-clavulanate (AUGMENTIN) 875-125 MG tablet  Every 12 hours        07/10/21 2349           Portions of this report may have been transcribed using voice recognition software. Every effort was made to ensure accuracy; however, inadvertent computerized transcription errors may be present.    Vertie Dibbern T, PA-C 07/10/21 2349

## 2021-07-15 ENCOUNTER — Encounter: Payer: Self-pay | Admitting: *Deleted

## 2021-07-15 ENCOUNTER — Encounter (HOSPITAL_COMMUNITY)
Admission: RE | Admit: 2021-07-15 | Discharge: 2021-07-15 | Disposition: A | Discharge status: Home / Self Care | Payer: Medicare HMO | Source: Ambulatory Visit | Attending: Internal Medicine | Admitting: Internal Medicine

## 2021-07-15 ENCOUNTER — Telehealth: Payer: Self-pay | Admitting: *Deleted

## 2021-07-15 ENCOUNTER — Ambulatory Visit (HOSPITAL_COMMUNITY): Admission: RE | Admit: 2021-07-15 | Payer: Medicare HMO | Source: Ambulatory Visit

## 2021-07-15 NOTE — Telephone Encounter (Signed)
Received message from endo patient was cancelling procedure for 6/6 as she was diagnosed with PNA on 6/1.  Pt rescheduled to 7/3 at 2:15pm. Aware will mail new instructions/pre-op appt.  Also she missed her CTA appt today. Pt had appts confused. She has been r/s'd to 6/ at 2:30pm, arrival 2pm, liquids only 4 hrs prior. Pt aware.  FYI to [employer]

## 2021-07-15 NOTE — Progress Notes (Signed)
Patient was seen in the ED this weekend.  Chest xray shows pneumonia.  Patient was treated with antibiotics and is still having some SOB with thick yellow/greenish sputum.  We will cancel her procedure EGD/ED for now and reschedule once the pneumonia is resolved.

## 2021-07-17 ENCOUNTER — Ambulatory Visit (HOSPITAL_COMMUNITY): Admission: RE | Admit: 2021-07-17 | Payer: Medicare HMO | Source: Home / Self Care

## 2021-07-17 ENCOUNTER — Ambulatory Visit (HOSPITAL_COMMUNITY)
Admission: RE | Admit: 2021-07-17 | Discharge: 2021-07-17 | Disposition: A | Discharge status: Home / Self Care | Payer: Medicare HMO | Source: Ambulatory Visit | Attending: Gastroenterology | Admitting: Gastroenterology

## 2021-07-17 ENCOUNTER — Encounter (HOSPITAL_COMMUNITY): Admission: RE | Payer: Self-pay | Source: Home / Self Care

## 2021-07-17 DIAGNOSIS — R109 Unspecified abdominal pain: Secondary | ICD-10-CM | POA: Diagnosis not present

## 2021-07-17 DIAGNOSIS — I774 Celiac artery compression syndrome: Secondary | ICD-10-CM | POA: Diagnosis not present

## 2021-07-17 DIAGNOSIS — R634 Abnormal weight loss: Secondary | ICD-10-CM | POA: Insufficient documentation

## 2021-07-17 DIAGNOSIS — M47817 Spondylosis without myelopathy or radiculopathy, lumbosacral region: Secondary | ICD-10-CM | POA: Diagnosis not present

## 2021-07-17 DIAGNOSIS — Q632 Ectopic kidney: Secondary | ICD-10-CM | POA: Diagnosis not present

## 2021-07-17 DIAGNOSIS — M4697 Unspecified inflammatory spondylopathy, lumbosacral region: Secondary | ICD-10-CM | POA: Diagnosis not present

## 2021-07-17 SURGERY — ESOPHAGOGASTRODUODENOSCOPY (EGD) WITH PROPOFOL
Anesthesia: Monitor Anesthesia Care

## 2021-07-17 MED ORDER — IOHEXOL 350 MG/ML SOLN
100.0000 mL | Freq: Once | INTRAVENOUS | Status: AC | PRN
  Administered 2021-07-17: 100 mL via INTRAVENOUS

## 2021-07-18 IMAGING — DX DG CHEST 2V
2 series · 3 of 3 positions shown · non-contrast
Comparison: 09/13/2018

CLINICAL DATA: Cough

EXAM:
CHEST - 2 VIEW

[Series 1: chest pa · 0.14mm/px · 2 of 2 slices shown]
[im 1/2]
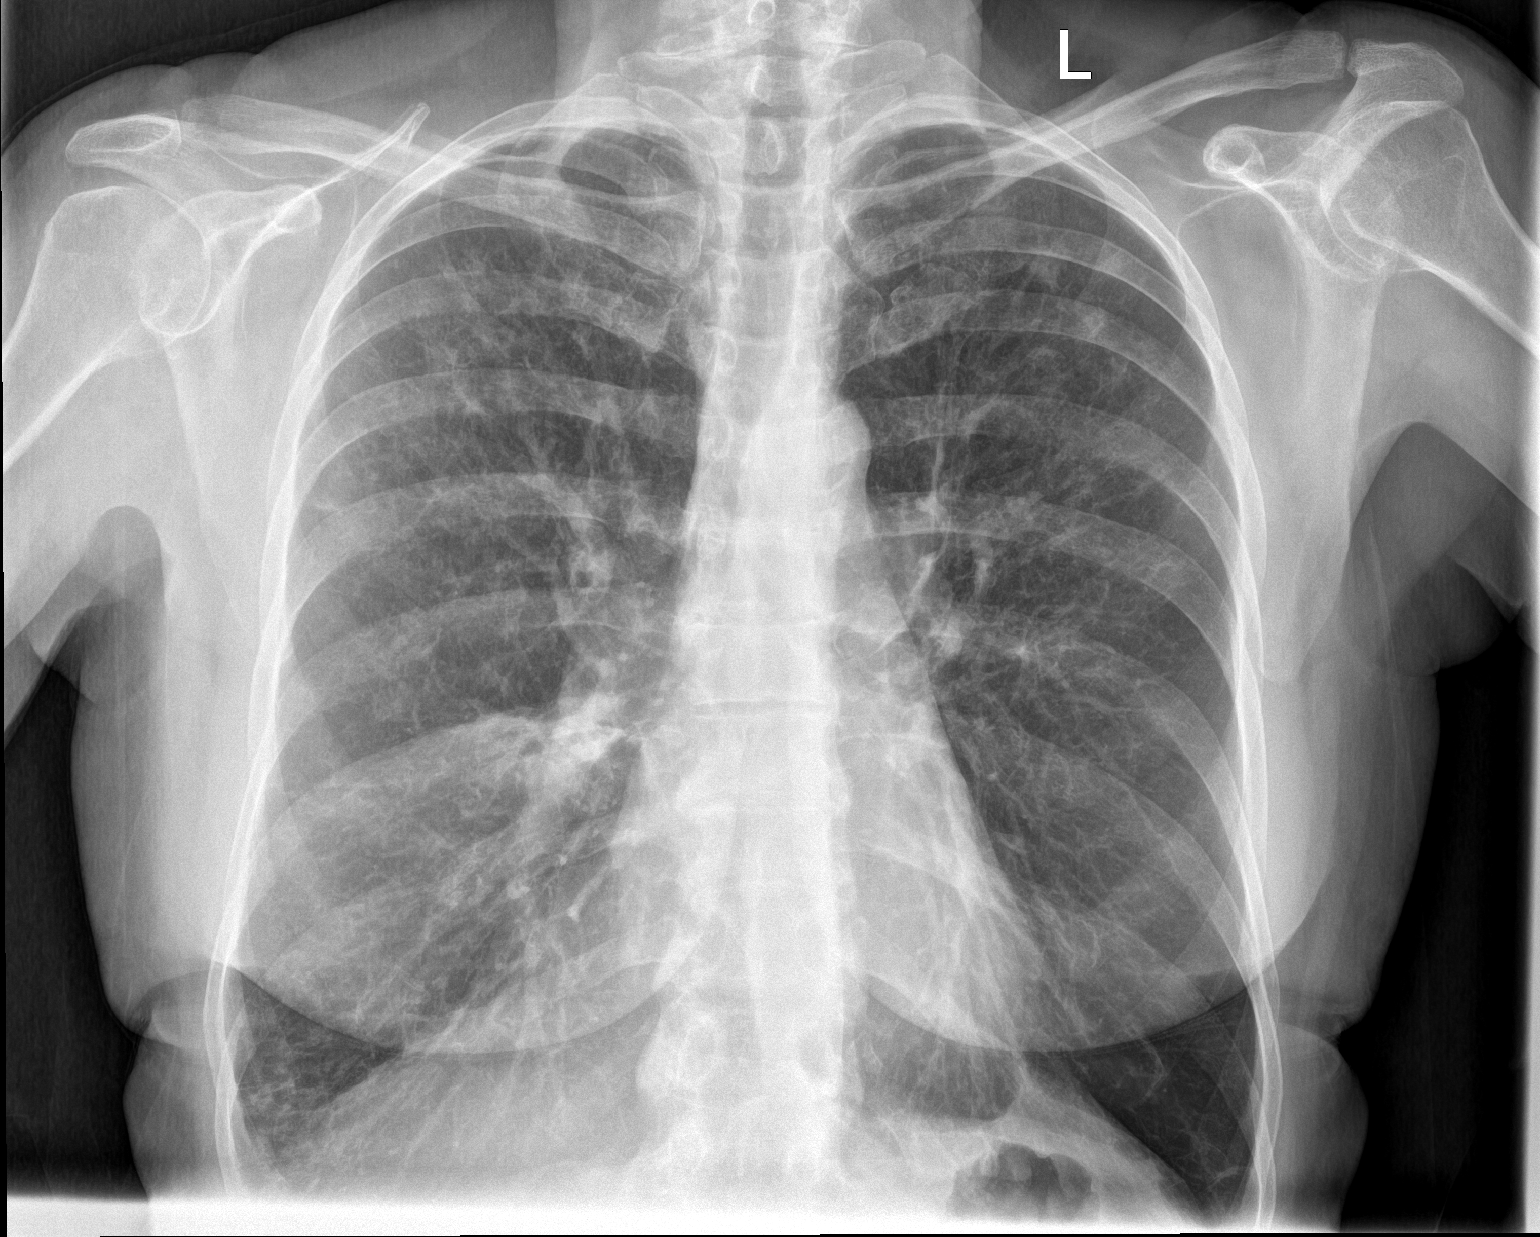
[im 2/2]
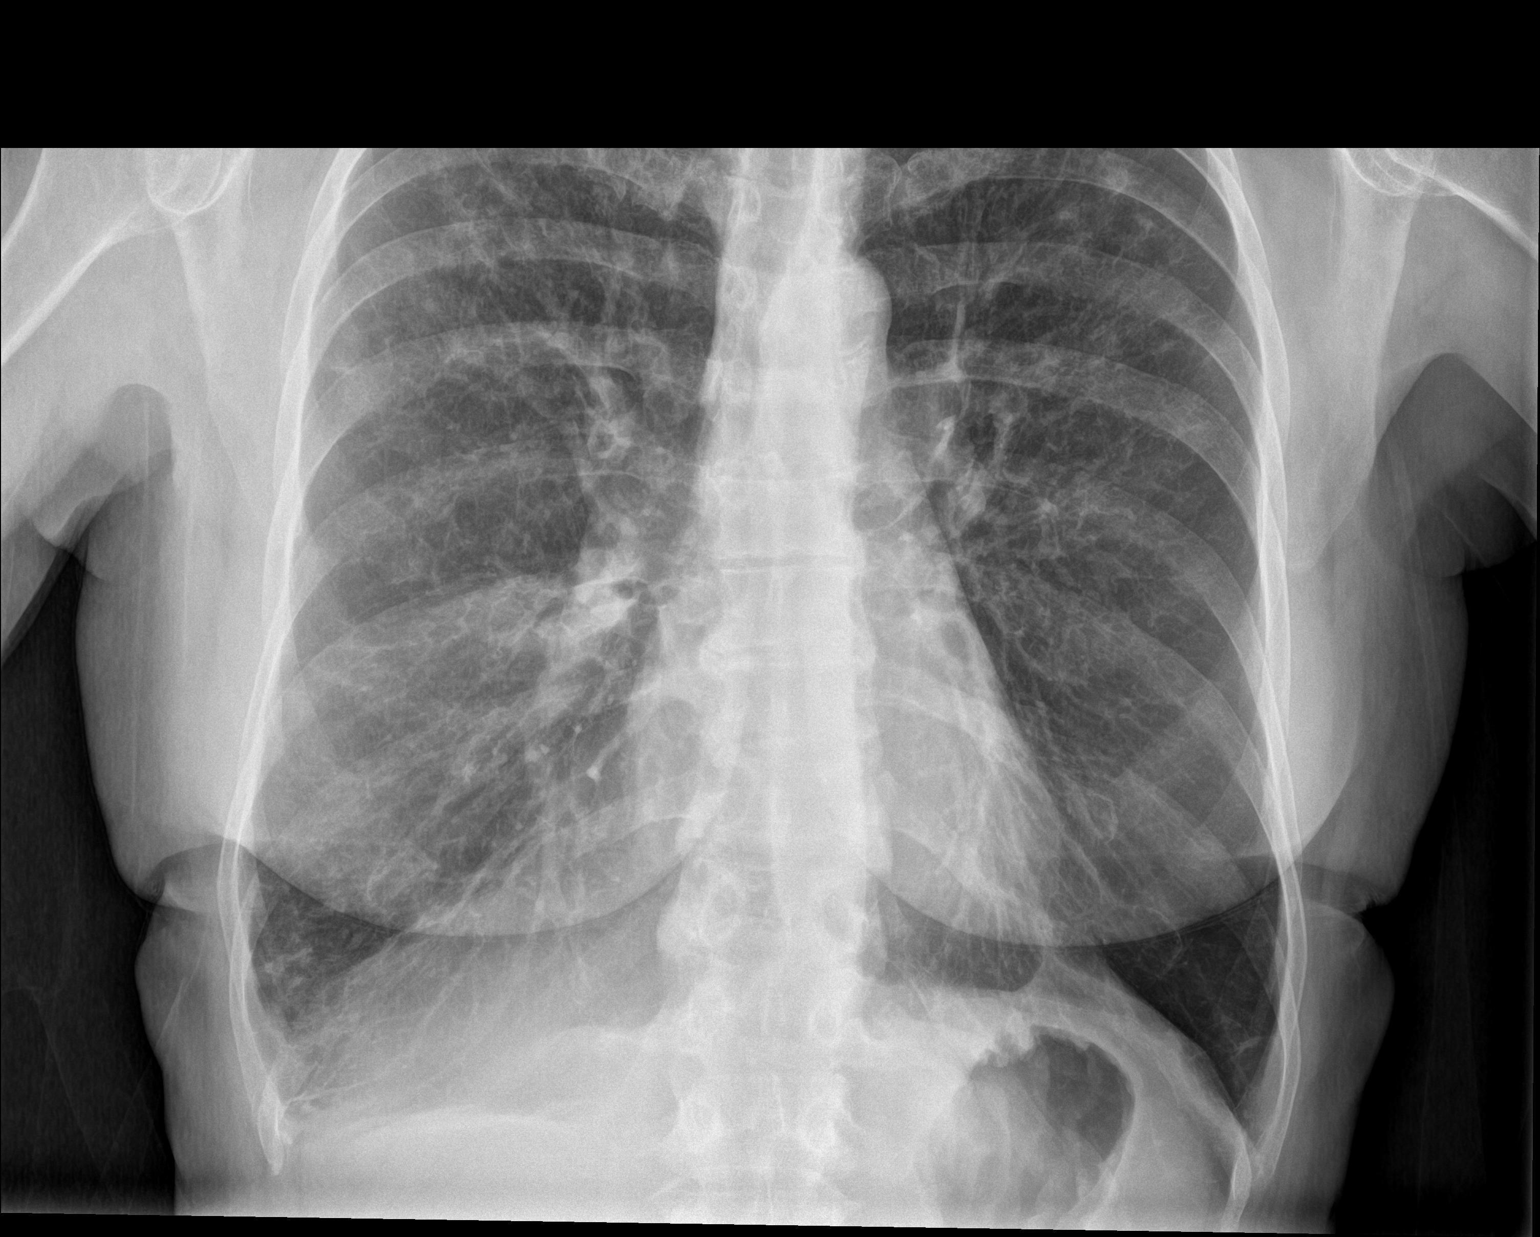

[chest lat]
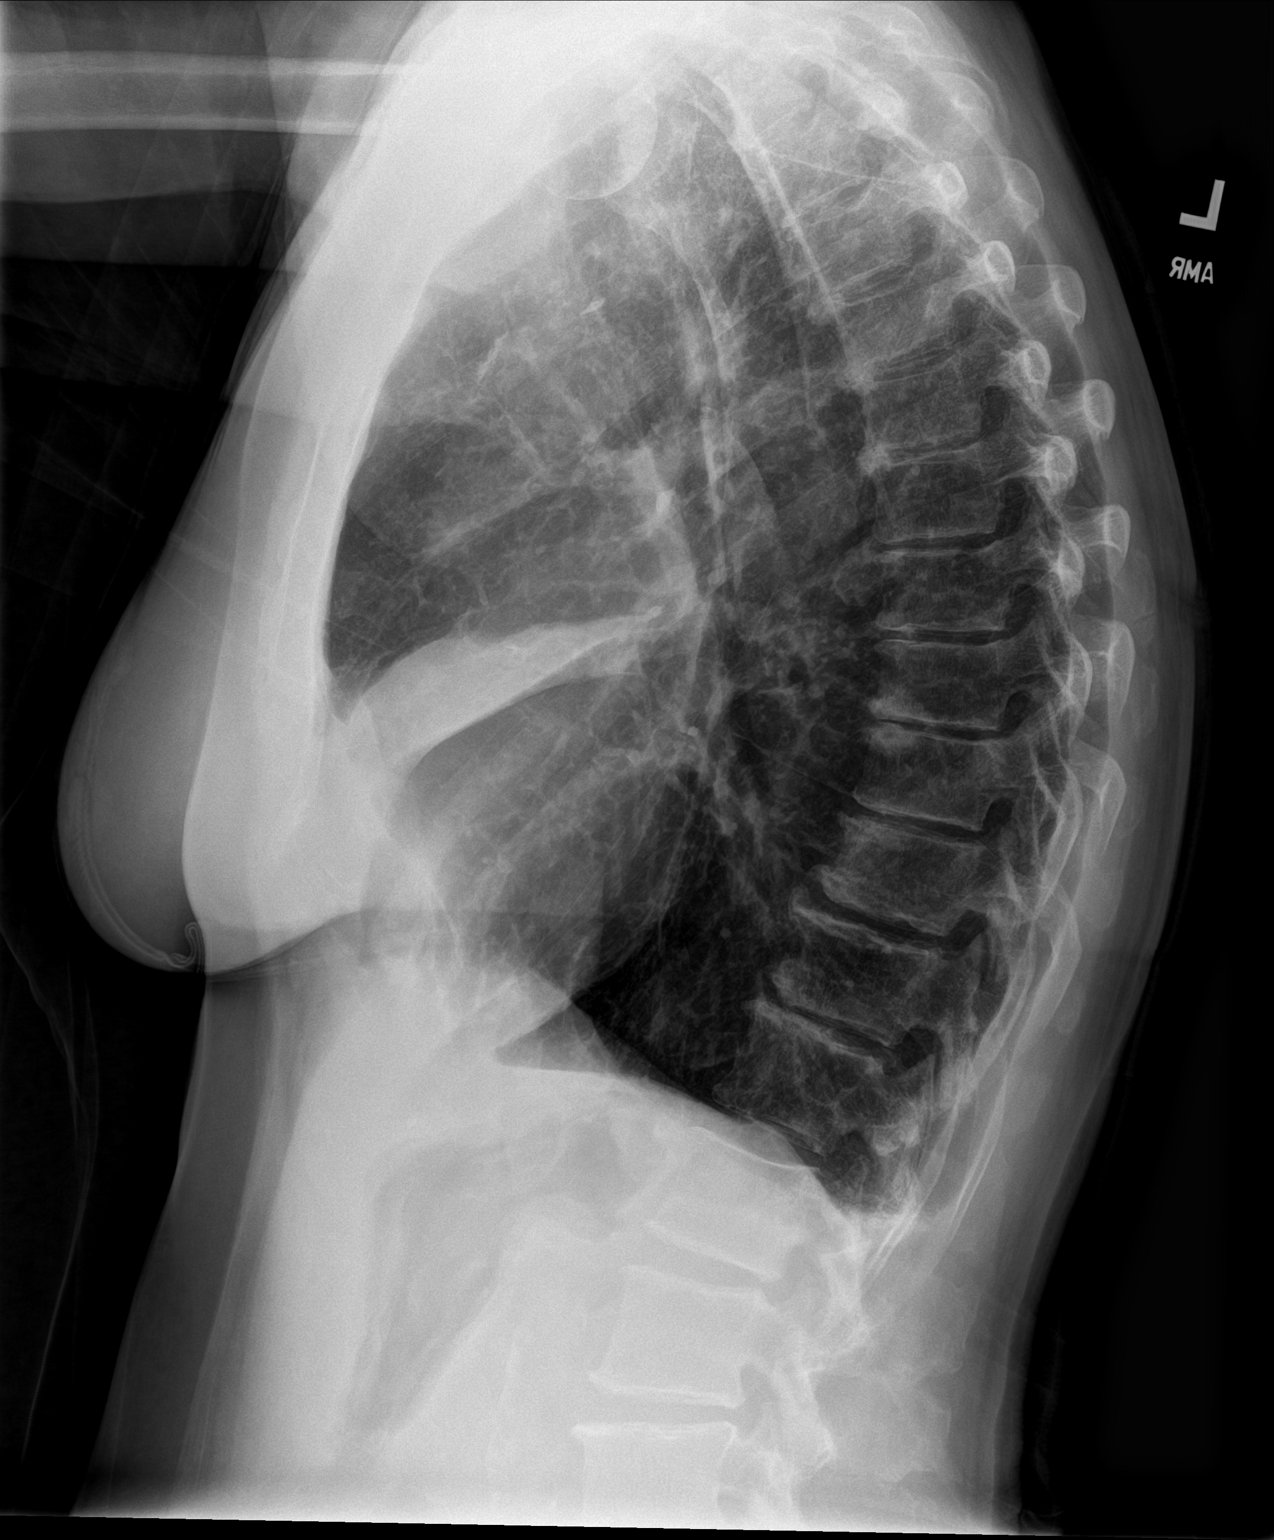

[3 of 3 positions shown; findings below may reference images not displayed]

FINDINGS: Right lower lobe opacity seen on prior chest radiograph are
significantly improved. Mild nodular opacities at the lateral right
lung base and involving bilateral upper lobes. Right middle lobe
collapse. Hyperinflation. Possible trace pleural effusions versus
pleural scarring. Similar cardiomediastinal contour. No acute
osseous abnormality.
IMPRESSION: 1. Right lower lobe opacity seen on prior chest radiograph are
significantly improved.
2. Nodular opacities at the lateral right lung base and involving
bilateral upper lobes may represent early pneumonia and/or
aspiration. Recommend follow up to resolution.
3. Persistent right middle lobe collapse.
4. Chronic hyperinflation, compatible with emphysema.

## 2021-07-21 ENCOUNTER — Other Ambulatory Visit: Payer: Self-pay | Admitting: *Deleted

## 2021-07-21 DIAGNOSIS — R109 Unspecified abdominal pain: Secondary | ICD-10-CM

## 2021-07-22 ENCOUNTER — Encounter: Payer: Self-pay | Admitting: Gastroenterology

## 2021-07-31 ENCOUNTER — Other Ambulatory Visit: Payer: Self-pay | Admitting: Family Medicine

## 2021-07-31 ENCOUNTER — Other Ambulatory Visit (HOSPITAL_COMMUNITY): Payer: Self-pay | Admitting: Family Medicine

## 2021-07-31 DIAGNOSIS — N281 Cyst of kidney, acquired: Secondary | ICD-10-CM

## 2021-07-31 NOTE — Patient Instructions (Signed)
SHANDEE JERGENS  07/31/2021     @PREFPERIOPPHARMACY @   Your procedure is scheduled on  08/11/2021.   Report to 10/12/2021 at  1215  P.M.   Call this number if you have problems the morning of surgery:  [phone]   Remember:  Follow the diet instructions given to you by the office.    Use your nebulizer and your inhaler before you come and bring your rescue inhaler with you.     Take these medicines the morning of surgery with A SIP OF WATER                                         None     Do not wear jewelry, make-up or nail polish.  Do not wear lotions, powders, or perfumes, or deodorant.  Do not shave 48 hours prior to surgery.  Men may shave face and neck.  Do not bring valuables to the hospital.  [facility] is not responsible for any belongings or valuables.  Contacts, dentures or bridgework may not be worn into surgery.  Leave your suitcase in the car.  After surgery it may be brought to your room.  For patients admitted to the hospital, discharge time will be determined by your treatment team.  Patients discharged the day of surgery will not be allowed to drive home and must have someone with them for 24 hours.    Special instructions:   DO NOT smoke tobacco or vape for 24 hours before your procedure.  Please read over the following fact sheets that you were given. Anesthesia Post-op Instructions and Care and Recovery After Surgery      Upper Endoscopy, Adult, Care After This sheet gives you information about how to care for yourself after your procedure. Your health care provider may also give you more specific instructions. If you have problems or questions, contact your health care provider. What can I expect after the procedure? After the procedure, it is common to have: A sore throat. Mild stomach pain or discomfort. Bloating. Nausea. Follow these instructions at home:  Follow instructions from your health care provider about what to  eat or drink after your procedure. Return to your normal activities as told by your health care provider. Ask your health care provider what activities are safe for you. Take over-the-counter and prescription medicines only as told by your health care provider. If you were given a sedative during the procedure, it can affect you for several hours. Do not drive or operate machinery until your health care provider says that it is safe. Keep all follow-up visits as told by your health care provider. This is important. Contact a health care provider if you have: A sore throat that lasts longer than one day. Trouble swallowing. Get help right away if: You vomit blood or your vomit looks like coffee grounds. You have: A fever. Bloody, black, or tarry stools. A severe sore throat or you cannot swallow. Difficulty breathing. Severe pain in your chest or abdomen. Summary After the procedure, it is common to have a sore throat, mild stomach discomfort, bloating, and nausea. If you were given a sedative during the procedure, it can affect you for several hours. Do not drive or operate machinery until your health care provider says that it is safe. Follow instructions from your health care provider  about what to eat or drink after your procedure. Return to your normal activities as told by your health care provider. This information is not intended to replace advice given to you by your health care provider. Make sure you discuss any questions you have with your health care provider. Document Revised: 12/02/2018 Document Reviewed: 06/28/2017 Elsevier Patient Education  2023 [employer] Esophageal Dilatation Esophageal dilatation, also called esophageal dilation, is a procedure to widen or open a blocked or narrowed part of the esophagus. The esophagus is the part of the body that moves food and liquid from the mouth to the stomach. You may need this procedure if: You have a buildup of scar tissue in  your esophagus that makes it difficult, painful, or impossible to swallow. This can be caused by gastroesophageal reflux disease (GERD). You have cancer of the esophagus. There is a problem with how food moves through your esophagus. In some cases, you may need this procedure repeated at a later time to dilate the esophagus gradually. Tell a health care provider about: Any allergies you have. All medicines you are taking, including vitamins, herbs, eye drops, creams, and over-the-counter medicines. Any problems you or family members have had with anesthetic medicines. Any blood disorders you have. Any surgeries you have had. Any medical conditions you have. Any antibiotic medicines you are required to take before dental procedures. Whether you are pregnant or may be pregnant. What are the risks? Generally, this is a safe procedure. However, problems may occur, including: Bleeding due to a tear in the lining of the esophagus. A hole, or perforation, in the esophagus. What happens before the procedure? Ask your health care provider about: Changing or stopping your regular medicines. This is especially important if you are taking diabetes medicines or blood thinners. Taking medicines such as aspirin and ibuprofen. These medicines can thin your blood. Do not take these medicines unless your health care provider tells you to take them. Taking over-the-counter medicines, vitamins, herbs, and supplements. Follow instructions from your health care provider about eating or drinking restrictions. Plan to have a responsible adult take you home from the hospital or clinic. Plan to have a responsible adult care for you for the time you are told after you leave the hospital or clinic. This is important. What happens during the procedure? You may be given a medicine to help you relax (sedative). A numbing medicine may be sprayed into the back of your throat, or you may gargle the medicine. Your health  care provider may perform the dilatation using various surgical instruments, such as: Simple dilators. This instrument is carefully placed in the esophagus to stretch it. Guided wire bougies. This involves using an endoscope to insert a wire into the esophagus. A dilator is passed over this wire to enlarge the esophagus. Then the wire is removed. Balloon dilators. An endoscope with a small balloon is inserted into the esophagus. The balloon is inflated to stretch the esophagus and open it up. The procedure may vary among health care providers and hospitals. What can I expect after the procedure? Your blood pressure, heart rate, breathing rate, and blood oxygen level will be monitored until you leave the hospital or clinic. Your throat may feel slightly sore and numb. This will get better over time. You will not be allowed to eat or drink until your throat is no longer numb. When you are able to drink, urinate, and sit on the edge of the bed without nausea or dizziness, you may  be able to return home. Follow these instructions at home: Take over-the-counter and prescription medicines only as told by your health care provider. If you were given a sedative during the procedure, it can affect you for several hours. Do not drive or operate machinery until your health care provider says that it is safe. Plan to have a responsible adult care for you for the time you are told. This is important. Follow instructions from your health care provider about any eating or drinking restrictions. Do not use any products that contain nicotine or tobacco, such as cigarettes, e-cigarettes, and chewing tobacco. If you need help quitting, ask your health care provider. Keep all follow-up visits. This is important. Contact a health care provider if: You have a fever. You have pain that is not relieved by medicine. Get help right away if: You have chest pain. You have trouble breathing. You have trouble  swallowing. You vomit blood. You have black, tarry, or bloody stools. These symptoms may represent a serious problem that is an emergency. Do not wait to see if the symptoms will go away. Get medical help right away. Call your local emergency services (911 in the [place] Do not drive yourself to the hospital. Summary Esophageal dilatation, also called esophageal dilation, is a procedure to widen or open a blocked or narrowed part of the esophagus. Plan to have a responsible adult take you home from the hospital or clinic. For this procedure, a numbing medicine may be sprayed into the back of your throat, or you may gargle the medicine. Do not drive or operate machinery until your health care provider says that it is safe. This information is not intended to replace advice given to you by your health care provider. Make sure you discuss any questions you have with your health care provider. Document Revised: 06/14/2019 Document Reviewed: 06/14/2019 Elsevier Patient Education  2023 Else[employer] Inc. Monitored Anesthesia Care, Care After This sheet gives you information about how to care for yourself after your procedure. Your health care provider may also give you more specific instructions. If you have problems or questions, contact your health care provider. What can I expect after the procedure? After the procedure, it is common to have: Tiredness. Forgetfulness about what happened after the procedure. Impaired judgment for important decisions. Nausea or vomiting. Some difficulty with balance. Follow these instructions at home: For the time period you were told by your health care provider:     Rest as needed. Do not participate in activities where you could fall or become injured. Do not drive or use machinery. Do not drink alcohol. Do not take sleeping pills or medicines that cause drowsiness. Do not make important decisions or sign legal documents. Do not take care of children on your  own. Eating and drinking Follow the diet that is recommended by your health care provider. Drink enough fluid to keep your urine pale yellow. If you vomit: Drink water, juice, or soup when you can drink without vomiting. Make sure you have little or no nausea before eating solid foods. General instructions Have a responsible adult stay with you for the time you are told. It is important to have someone help care for you until you are awake and alert. Take over-the-counter and prescription medicines only as told by your health care provider. If you have sleep apnea, surgery and certain medicines can increase your risk for breathing problems. Follow instructions from your health care provider about wearing your sleep device: Anytime you are  sleeping, including during daytime naps. While taking prescription pain medicines, sleeping medicines, or medicines that make you drowsy. Avoid smoking. Keep all follow-up visits as told by your health care provider. This is important. Contact a health care provider if: You keep feeling nauseous or you keep vomiting. You feel light-headed. You are still sleepy or having trouble with balance after 24 hours. You develop a rash. You have a fever. You have redness or swelling around the IV site. Get help right away if: You have trouble breathing. You have new-onset confusion at home. Summary For several hours after your procedure, you may feel tired. You may also be forgetful and have poor judgment. Have a responsible adult stay with you for the time you are told. It is important to have someone help care for you until you are awake and alert. Rest as told. Do not drive or operate machinery. Do not drink alcohol or take sleeping pills. Get help right away if you have trouble breathing, or if you suddenly become confused. This information is not intended to replace advice given to you by your health care provider. Make sure you discuss any questions you  have with your health care provider. Document Revised: 12/31/2020 Document Reviewed: 12/29/2018 Elsevier Patient Education  2023 [employer].

## 2021-08-01 ENCOUNTER — Other Ambulatory Visit: Payer: Self-pay | Admitting: Family Medicine

## 2021-08-01 ENCOUNTER — Other Ambulatory Visit (HOSPITAL_COMMUNITY): Payer: Self-pay | Admitting: Family Medicine

## 2021-08-01 DIAGNOSIS — J189 Pneumonia, unspecified organism: Secondary | ICD-10-CM

## 2021-08-04 ENCOUNTER — Other Ambulatory Visit (HOSPITAL_COMMUNITY): Payer: Self-pay | Admitting: Family Medicine

## 2021-08-04 ENCOUNTER — Ambulatory Visit (HOSPITAL_COMMUNITY)
Admission: RE | Admit: 2021-08-04 | Discharge: 2021-08-04 | Disposition: A | Discharge status: Home / Self Care | Payer: Medicare HMO | Source: Ambulatory Visit | Attending: Family Medicine | Admitting: Family Medicine

## 2021-08-04 DIAGNOSIS — Z8701 Personal history of pneumonia (recurrent): Secondary | ICD-10-CM | POA: Insufficient documentation

## 2021-08-04 DIAGNOSIS — M47814 Spondylosis without myelopathy or radiculopathy, thoracic region: Secondary | ICD-10-CM | POA: Diagnosis not present

## 2021-08-04 DIAGNOSIS — I700 Atherosclerosis of aorta: Secondary | ICD-10-CM | POA: Diagnosis not present

## 2021-08-07 ENCOUNTER — Encounter (HOSPITAL_COMMUNITY)
Admission: RE | Admit: 2021-08-07 | Discharge: 2021-08-07 | Disposition: A | Discharge status: Home / Self Care | Payer: Medicare HMO | Source: Ambulatory Visit | Attending: Internal Medicine | Admitting: Internal Medicine

## 2021-08-07 ENCOUNTER — Encounter (HOSPITAL_COMMUNITY): Payer: Self-pay

## 2021-08-11 ENCOUNTER — Encounter (HOSPITAL_COMMUNITY)
Admission: RE | Disposition: A | Discharge status: Home / Self Care | Payer: Self-pay | Source: Home / Self Care | Attending: Internal Medicine

## 2021-08-11 ENCOUNTER — Ambulatory Visit (HOSPITAL_COMMUNITY)
Admission: RE | Admit: 2021-08-11 | Discharge: 2021-08-11 | Disposition: A | Discharge status: Home / Self Care | Payer: Medicare HMO | Attending: Internal Medicine | Admitting: Internal Medicine

## 2021-08-11 ENCOUNTER — Ambulatory Visit (HOSPITAL_BASED_OUTPATIENT_CLINIC_OR_DEPARTMENT_OTHER): Payer: Medicare HMO | Admitting: Anesthesiology

## 2021-08-11 ENCOUNTER — Ambulatory Visit (HOSPITAL_COMMUNITY): Payer: Medicare HMO | Admitting: Anesthesiology

## 2021-08-11 DIAGNOSIS — R634 Abnormal weight loss: Secondary | ICD-10-CM | POA: Insufficient documentation

## 2021-08-11 DIAGNOSIS — K2970 Gastritis, unspecified, without bleeding: Secondary | ICD-10-CM

## 2021-08-11 DIAGNOSIS — K222 Esophageal obstruction: Secondary | ICD-10-CM | POA: Insufficient documentation

## 2021-08-11 DIAGNOSIS — K921 Melena: Secondary | ICD-10-CM | POA: Diagnosis not present

## 2021-08-11 DIAGNOSIS — R1013 Epigastric pain: Secondary | ICD-10-CM | POA: Diagnosis not present

## 2021-08-11 DIAGNOSIS — R109 Unspecified abdominal pain: Secondary | ICD-10-CM | POA: Diagnosis not present

## 2021-08-11 DIAGNOSIS — R69 Illness, unspecified: Secondary | ICD-10-CM | POA: Diagnosis not present

## 2021-08-11 DIAGNOSIS — F17210 Nicotine dependence, cigarettes, uncomplicated: Secondary | ICD-10-CM | POA: Diagnosis not present

## 2021-08-11 DIAGNOSIS — R1310 Dysphagia, unspecified: Secondary | ICD-10-CM | POA: Insufficient documentation

## 2021-08-11 DIAGNOSIS — A048 Other specified bacterial intestinal infections: Secondary | ICD-10-CM

## 2021-08-11 DIAGNOSIS — J449 Chronic obstructive pulmonary disease, unspecified: Secondary | ICD-10-CM | POA: Diagnosis not present

## 2021-08-11 DIAGNOSIS — R5383 Other fatigue: Secondary | ICD-10-CM

## 2021-08-11 DIAGNOSIS — A159 Respiratory tuberculosis unspecified: Secondary | ICD-10-CM

## 2021-08-11 DIAGNOSIS — Z1211 Encounter for screening for malignant neoplasm of colon: Secondary | ICD-10-CM

## 2021-08-11 HISTORY — PX: BIOPSY: SHX5522

## 2021-08-11 HISTORY — PX: ESOPHAGOGASTRODUODENOSCOPY (EGD) WITH PROPOFOL: SHX5813

## 2021-08-11 HISTORY — PX: BALLOON DILATION: SHX5330

## 2021-08-11 SURGERY — ESOPHAGOGASTRODUODENOSCOPY (EGD) WITH PROPOFOL
Anesthesia: General

## 2021-08-11 MED ORDER — LACTATED RINGERS IV SOLN: INTRAVENOUS | Status: DC

## 2021-08-11 MED ORDER — LIDOCAINE 2% (20 MG/ML) 5 ML SYRINGE
INTRAMUSCULAR | Status: DC | PRN
  Administered 2021-08-11: 50 mg via INTRAVENOUS

## 2021-08-11 MED ORDER — PROPOFOL 10 MG/ML IV BOLUS
INTRAVENOUS | Status: DC | PRN
  Administered 2021-08-11: 20 mg via INTRAVENOUS
  Administered 2021-08-11: 60 mg via INTRAVENOUS
  Administered 2021-08-11: 20 mg via INTRAVENOUS

## 2021-08-11 NOTE — Anesthesia Postprocedure Evaluation (Signed)
Anesthesia Post Note  Patient: Karen Church  Procedure(s) Performed: ESOPHAGOGASTRODUODENOSCOPY (EGD) WITH PROPOFOL BALLOON DILATION BIOPSY  Patient location during evaluation: Phase II Anesthesia Type: General Level of consciousness: awake and alert and oriented Pain management: pain level controlled Vital Signs Assessment: post-procedure vital signs reviewed and stable Respiratory status: spontaneous breathing, nonlabored ventilation and respiratory function stable Cardiovascular status: blood pressure returned to baseline and stable Postop Assessment: no apparent nausea or vomiting Anesthetic complications: no   No notable events documented.   Last Vitals:  Vitals:   08/11/21 0947 08/11/21 1013  BP:  (!) 155/57  Pulse: 97 87  Resp: 18 16  Temp: 36.5 C 36.7 C  SpO2: 94% 96%    Last Pain:  Vitals:   08/11/21 1013  TempSrc: Oral  PainSc: 0-No pain                 [name]

## 2021-08-11 NOTE — Op Note (Signed)
[facility] Patient Name: Karen Church Procedure Date: 08/11/2021 9:34 AM MRN: [id number] Date of Birth: [birth year] Attending MD: Elon Alas. Edgar Frisk CSN: [id number] Age: 71 Admit Type: Outpatient Procedure:                Upper GI endoscopy Indications:              Epigastric abdominal pain, Dysphagia, Weight loss Providers:                Elon Alas. Abbey Chatters, DO, Gwynneth Albright RN, RN,                            Lurline Del, RN, Raphael Gibney, Technician Referring MD:              Medicines:                See the Anesthesia note for documentation of the                            administered medications Complications:            No immediate complications. Estimated Blood Loss:     Estimated blood loss was minimal. Procedure:                Pre-Anesthesia Assessment:                           - The anesthesia plan was to use monitored                            anesthesia care (MAC).                           After obtaining informed consent, the endoscope was                            passed under direct vision. Throughout the                            procedure, the patient's blood pressure, pulse, and                            oxygen saturations were monitored continuously. The                            GIF-H190 ([id number]) scope was introduced through the                            mouth, and advanced to the second part of duodenum.                            The upper GI endoscopy was accomplished without                            difficulty. The patient tolerated the procedure  well. Scope In: 9:59:26 AM Scope Out: 10:06:51 AM Total Procedure Duration: 0 hours 7 minutes 25 seconds  Findings:      A mild Schatzki ring was found in the lower third of the esophagus. A       TTS dilator was passed through the scope. Dilation with an 18-19-20 mm       balloon dilator was performed to 20 mm. The dilation site was examined       and  showed moderate improvement in luminal narrowing.      Patchy mild inflammation characterized by erythema was found in the       gastric body. Biopsies were taken with a cold forceps for Helicobacter       pylori testing.      The duodenal bulb, first portion of the duodenum and second portion of       the duodenum were normal. Impression:               - Mild Schatzki ring. Dilated.                           - Gastritis. Biopsied.                           - Normal duodenal bulb, first portion of the                            duodenum and second portion of the duodenum. Moderate Sedation:      Per Anesthesia Care Recommendation:           - Patient has a contact number available for                            emergencies. The signs and symptoms of potential                            delayed complications were discussed with the                            patient. Return to normal activities tomorrow.                            Written discharge instructions were provided to the                            patient.                           - Resume previous diet.                           - Continue present medications.                           - Await pathology results.                           - Repeat upper endoscopy PRN for retreatment.                           -  Return to GI clinic in 4 months. Procedure Code(s):        --- Professional ---                           [phone], Esophagogastroduodenoscopy, flexible,                            transoral; with transendoscopic balloon dilation of                            esophagus (less than 30 mm diameter)                           [id number], 59, Esophagogastroduodenoscopy, flexible,                            transoral; with biopsy, single or multiple Diagnosis Code(s):        --- Professional ---                           K22.2, Esophageal obstruction                           K29.70, Gastritis, unspecified, without bleeding                            R10.13, Epigastric pain                           R13.10, Dysphagia, unspecified                           R63.4, Abnormal weight loss CPT copyright 2019 American Medical Association. All rights reserved. The codes documented in this report are preliminary and upon coder review may  be revised to meet current compliance requirements. Elon Alas. Abbey Chatters, DO [place] Aaliyha Mumford, DO 08/11/2021 10:10:05 AM This report has been signed electronically. Number of Addenda: 0

## 2021-08-11 NOTE — H&P (Signed)
Primary Care Physician:  Benita Stabile, MD Primary Gastroenterologist:  Dr. Marletta Lor  Pre-Procedure History & Physical: HPI:  Karen Church is a 71 y.o. female is here for an EGD with possible dilation to be performed for dysphagia, abdominal pain, weight loss.   Past Medical History:  Diagnosis Date   Carpal tunnel syndrome    COPD (chronic obstructive pulmonary disease) (HCC)     Past Surgical History:  Procedure Laterality Date   ABDOMINAL HYSTERECTOMY     BIOPSY  12/10/2015   Procedure: BIOPSY;  Surgeon: Corbin Ade, MD;  Location: [facility];  Service: Endoscopy;;  Gastric    CESAREAN SECTION     COLONOSCOPY N/A 12/10/2015   normal   ESOPHAGOGASTRODUODENOSCOPY N/A 12/10/2015   normal esophagus s/p dilation, +H.pylori   MALONEY DILATION N/A 12/10/2015   Procedure: [name] DILATION;  Surgeon: Corbin Ade, MD;  Location: [facility];  Service: Endoscopy;  Laterality: N/A;   SKIN CANCER EXCISION     SMALL INTESTINE SURGERY N/A 1967   Resection    Prior to Admission medications   Medication Sig Start Date End Date Taking? Authorizing Provider  albuterol (PROVENTIL) (5 MG/ML) 0.5% nebulizer solution Take 0.5 mLs (2.5 mg total) by nebulization every 6 (six) hours as needed for wheezing or shortness of breath. 04/06/20   Eber Hong, MD  albuterol (VENTOLIN HFA) 108 (90 Base) MCG/ACT inhaler Inhale 2 puffs into the lungs every 4 (four) hours as needed for shortness of breath. 04/15/21   [provider]  amoxicillin-clavulanate (AUGMENTIN) 875-125 MG tablet Take 1 tablet by mouth every 12 (twelve) hours. 07/10/21   Roemhildt, Lorin T, PA-C  Ascorbic Acid (VITAMIN C PO) Take 1 tablet by mouth daily.    [provider]  aspirin EC 81 MG tablet Take 81 mg by mouth daily.     [provider]  Multiple Vitamin (MULTIVITAMIN WITH MINERALS) TABS Take 1 tablet by mouth daily.    [provider]  TRELEGY ELLIPTA 100-62.5-25 MCG/INH AEPB Inhale 1  puff into the lungs daily. 12/21/19   [provider]    Allergies as of 07/15/2021 - Review Complete 07/15/2021  Allergen Reaction Noted   Lasix [furosemide] Swelling 09/14/2018    Family History  Problem Relation Age of Onset   Colon polyps Sister    Colon polyps Brother    Colon cancer Neg Hx     Social History   Socioeconomic History   Marital status: Married    Spouse name: Not on file   Number of children: Not on file   Years of education: Not on file   Highest education level: Not on file  Occupational History   Not on file  Tobacco Use   Smoking status: Every Day    Packs/day: 0.50    Years: 50.00    Total pack years: 25.00    Types: Cigarettes   Smokeless tobacco: Never   Tobacco comments:    currently at 1/2 ppd 12/26/19//lmr  Vaping Use   Vaping Use: Never used  Substance and Sexual Activity   Alcohol use: No   Drug use: No   Sexual activity: Yes  Other Topics Concern   Not on file  Social History Narrative   Not on file   Social Determinants of Health   Financial Resource Strain: Not on file  Food Insecurity: Not on file  Transportation Needs: Not on file  Physical Activity: Not on file  Stress: Not on file  Social Connections: Not on file  Intimate Partner Violence: Not on file    Review of Systems: General: Negative for fever, chills, fatigue, weakness. Eyes: Negative for vision changes.  ENT: Negative for hoarseness, difficulty swallowing , nasal congestion. CV: Negative for chest pain, angina, palpitations, dyspnea on exertion, peripheral edema.  Respiratory: Negative for dyspnea at rest, dyspnea on exertion, cough, sputum, wheezing.  GI: See history of present illness. GU:  Negative for dysuria, hematuria, urinary incontinence, urinary frequency, nocturnal urination.  MS: Negative for joint pain, low back pain.  Derm: Negative for rash or itching.  Neuro: Negative for weakness, abnormal sensation, seizure, frequent headaches,  memory loss, confusion.  Psych: Negative for anxiety, depression Endo: Negative for unusual weight change.  Heme: Negative for bruising or bleeding. Allergy: Negative for rash or hives.  Physical Exam: Vital signs in last 24 hours:     General:   Alert,  Well-developed, well-nourished, pleasant and cooperative in NAD Head:  Normocephalic and atraumatic. Eyes:  Sclera clear, no icterus.   Conjunctiva pink. Ears:  Normal auditory acuity. Nose:  No deformity, discharge,  or lesions. Mouth:  No deformity or lesions, dentition normal. Neck:  Supple; no masses or thyromegaly. Lungs:  Clear throughout to auscultation.   No wheezes, crackles, or rhonchi. No acute distress. Heart:  Regular rate and rhythm; no murmurs, clicks, rubs,  or gallops. Abdomen:  Soft, nontender and nondistended. No masses, hepatosplenomegaly or hernias noted. Normal bowel sounds, without guarding, and without rebound.   Msk:  Symmetrical without gross deformities. Normal posture. Extremities:  Without clubbing or edema. Neurologic:  Alert and  oriented x4;  grossly normal neurologically. Skin:  Intact without significant lesions or rashes. Cervical Nodes:  No significant cervical adenopathy. Psych:  Alert and cooperative. Normal mood and affect.   Impression/Plan: Karen Church is here for an EGD with possible dilation to be performed for dysphagia, abdominal pain, weight loss.   Risks, benefits, limitations, imponderables and alternatives regarding EGD have been reviewed with the patient. Questions have been answered. All parties agreeable.

## 2021-08-11 NOTE — Transfer of Care (Signed)
Immediate Anesthesia Transfer of Care Note  Patient: Karen Church  Procedure(s) Performed: ESOPHAGOGASTRODUODENOSCOPY (EGD) WITH PROPOFOL BALLOON DILATION BIOPSY  Patient Location: Endoscopy Unit  Anesthesia Type:MAC  Level of Consciousness: awake, alert , oriented and patient cooperative  Airway & Oxygen Therapy: Patient Spontanous Breathing and Patient connected to nasal cannula oxygen  Post-op Assessment: Report given to RN, Post -op Vital signs reviewed and stable and Patient moving all extremities  Post vital signs: Reviewed and stable  Last Vitals:  Vitals Value Taken Time  BP 155/57 08/11/21 1013  Temp 36.7 C 08/11/21 1013  Pulse 87 08/11/21 1013  Resp 16 08/11/21 1013  SpO2 96 % 08/11/21 1013    Last Pain:  Vitals:   08/11/21 1013  TempSrc: Oral  PainSc: 0-No pain         Complications: No notable events documented.

## 2021-08-11 NOTE — Anesthesia Preprocedure Evaluation (Signed)
Anesthesia Evaluation  Patient identified by MRN, date of birth, ID band Patient awake    Reviewed: Allergy & Precautions, NPO status , Patient's Chart, lab work & pertinent test results  Airway Mallampati: I  TM Distance: >3 FB Neck ROM: Full    Dental  (+) Dental Advisory Given, Missing, Poor Dentition,    Pulmonary shortness of breath and with exertion, COPD,  COPD inhaler, Current Smoker and Patient abstained from smoking.,    Pulmonary exam normal breath sounds clear to auscultation       Cardiovascular negative cardio ROS Normal cardiovascular exam Rhythm:Regular Rate:Normal     Neuro/Psych  Neuromuscular disease negative psych ROS   GI/Hepatic negative GI ROS, Neg liver ROS,   Endo/Other  negative endocrine ROS  Renal/GU negative Renal ROS  negative genitourinary   Musculoskeletal negative musculoskeletal ROS (+)   Abdominal   Peds negative pediatric ROS (+)  Hematology negative hematology ROS (+)   Anesthesia Other Findings   Reproductive/Obstetrics negative OB ROS                             Anesthesia Physical Anesthesia Plan  ASA: 3  Anesthesia Plan: General   Post-op Pain Management: Minimal or no pain anticipated   Induction: Intravenous  PONV Risk Score and Plan: Propofol infusion  Airway Management Planned: Nasal Cannula and Natural Airway  Additional Equipment:   Intra-op Plan:   Post-operative Plan:   Informed Consent: I have reviewed the patients History and Physical, chart, labs and discussed the procedure including the risks, benefits and alternatives for the proposed anesthesia with the patient or authorized representative who has indicated his/her understanding and acceptance.     Dental advisory given  Plan Discussed with: CRNA and Surgeon  Anesthesia Plan Comments:         Anesthesia Quick Evaluation

## 2021-08-11 NOTE — Discharge Instructions (Signed)
EGD Discharge instructions Please read the instructions outlined below and refer to this sheet in the next few weeks. These discharge instructions provide you with general information on caring for yourself after you leave the hospital. Your doctor may also give you specific instructions. While your treatment has been planned according to the most current medical practices available, unavoidable complications occasionally occur. If you have any problems or questions after discharge, please call your doctor. ACTIVITY You may resume your regular activity but move at a slower pace for the next 24 hours.  Take frequent rest periods for the next 24 hours.  Walking will help expel (get rid of) the air and reduce the bloated feeling in your abdomen.  No driving for 24 hours (because of the anesthesia (medicine) used during the test).  You may shower.  Do not sign any important legal documents or operate any machinery for 24 hours (because of the anesthesia used during the test).  NUTRITION Drink plenty of fluids.  You may resume your normal diet.  Begin with a light meal and progress to your normal diet.  Avoid alcoholic beverages for 24 hours or as instructed by your caregiver.  MEDICATIONS You may resume your normal medications unless your caregiver tells you otherwise.  WHAT YOU CAN EXPECT TODAY You may experience abdominal discomfort such as a feeling of fullness or "gas" pains.  FOLLOW-UP Your doctor will discuss the results of your test with you.  SEEK IMMEDIATE MEDICAL ATTENTION IF ANY OF THE FOLLOWING OCCUR: Excessive nausea (feeling sick to your stomach) and/or vomiting.  Severe abdominal pain and distention (swelling).  Trouble swallowing.  Temperature over 101 F (37.8 C).  Rectal bleeding or vomiting of blood.    Your EGD revealed mild amount inflammation in your stomach.  I took biopsies of this to rule out infection with a bacteria called H. pylori.  Await pathology results, my  office will contact you. I did stretch your esophagus today. Hopefully this helps with your swallowing. Follow up with GI in 3-4 months.    I hope you have a great rest of your week!  Hennie Duos. Marletta Lor, D.O. Gastroenterology and Hepatology [facility]

## 2021-08-13 LAB — SURGICAL PATHOLOGY

## 2021-08-19 ENCOUNTER — Encounter (HOSPITAL_COMMUNITY): Payer: Self-pay | Admitting: Internal Medicine

## 2021-08-20 ENCOUNTER — Encounter: Payer: Medicare HMO | Admitting: Vascular Surgery

## 2021-08-21 ENCOUNTER — Telehealth: Payer: Self-pay | Admitting: Internal Medicine

## 2021-08-21 NOTE — Telephone Encounter (Signed)
See path result note

## 2021-08-21 NOTE — Telephone Encounter (Signed)
Pt LMOM after hours saying she was returning Tammy's call. Please call her at [phone]

## 2021-08-27 ENCOUNTER — Encounter: Payer: Self-pay | Admitting: Vascular Surgery

## 2021-08-27 ENCOUNTER — Ambulatory Visit: Payer: Medicare HMO | Admitting: Vascular Surgery

## 2021-08-27 VITALS — BP 125/72 | HR 84 | Temp 98.4°F | Resp 16 | Ht 62.0 in | Wt 101.0 lb

## 2021-08-27 DIAGNOSIS — G8929 Other chronic pain: Secondary | ICD-10-CM

## 2021-08-27 DIAGNOSIS — R1013 Epigastric pain: Secondary | ICD-10-CM | POA: Diagnosis not present

## 2021-08-27 NOTE — Progress Notes (Signed)
[facility] [place]  Patient name: FUJIKO PICAZO MRN: [id number] DOB: 12/16/1950 Sex: female  REASON FOR CONSULT: Discuss recent CT scan and evaluation of abdominal pain  HPI: LUNELL ROBART is a 71 y.o. female, who is here today for discussion of recent CT scan.  She has a long history of abdominal discomfort.  She has several components of this.  She reports that after she eats she has gassy crampy sensation and needs to have a bowel movement.  She also has history of what sounds like esophageal stricture and has required several different esophageal stretching's.  She reports that she has lost weight from 126 pounds down to 100 pounds.  She reports that she generally does not have much taste for food but if she does she is able to eat but if she forces it makes her sick to her stomach and vomit.  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  Past Medical History:  Diagnosis Date   Carpal tunnel syndrome    COPD (chronic obstructive pulmonary disease) (HCC)     Family History  Problem Relation Age of Onset   Colon polyps Sister    Colon polyps Brother    Colon cancer Neg Hx     SOCIAL HISTORY: Social History   Socioeconomic History   Marital status: Married    Spouse name: Not on file   Number of children: Not on file   Years of education: Not on file   Highest education level: Not on file  Occupational History   Not on file  Tobacco Use   Smoking status: Every Day    Packs/day: 0.50    Years: 50.00    Total pack years: 25.00    Types: Cigarettes    Passive exposure: Current   Smokeless tobacco: Never   Tobacco comments:    currently at 1/2 ppd 12/26/19//lmr  Vaping Use   Vaping Use: Never used   Substance and Sexual Activity   Alcohol use: No   Drug use: No   Sexual activity: Yes  Other Topics Concern   Not on file  Social History Narrative   Not on file   Social Determinants of Health   Financial Resource Strain: Not on file  Food Insecurity: Not on file  Transportation Needs: Not on file  Physical Activity: Not on file  Stress: Not on file  Social Connections: Not on file  Intimate Partner Violence: Not on file    Allergies  Allergen Reactions   Lasix [Furosemide] Swelling    Current Outpatient Medications  Medication Sig Dispense Refill   Ascorbic Acid (VITAMIN C PO) Take 1 tablet by mouth daily.     aspirin EC 81 MG tablet Take 81 mg by mouth daily.      Multiple Vitamin (MULTIVITAMIN WITH MINERALS) TABS Take 1 tablet by mouth daily.     TRELEGY ELLIPTA 100-62.5-25 MCG/INH AEPB Inhale 1 puff into the lungs daily.     albuterol (PROVENTIL) (5 MG/ML) 0.5% nebulizer solution Take 0.5 mLs (2.5 mg total) by nebulization every 6 (six) hours as needed for wheezing or shortness of breath. (Patient not taking: Reported on 08/27/2021) 20 mL 12   albuterol (VENTOLIN HFA) 108 (90 Base) MCG/ACT inhaler Inhale 2 puffs  into the lungs every 4 (four) hours as needed for shortness of breath. (Patient not taking: Reported on 08/27/2021)     No current facility-administered medications for this visit.    REVIEW OF SYSTEMS:  [X]  denotes positive finding, [ ]  denotes negative finding Cardiac  Comments:  Chest pain or chest pressure: x   Shortness of breath upon exertion: x   Short of breath when lying flat:    Irregular heart rhythm:        Vascular    Pain in calf, thigh, or hip brought on by ambulation:    Pain in feet at night that wakes you up from your sleep:     Blood clot in your veins:    Leg swelling:         Pulmonary    Oxygen at home:    Productive cough:     Wheezing:         Neurologic    Sudden weakness in arms or legs:     Sudden numbness in arms or  legs:     Sudden onset of difficulty speaking or slurred speech:    Temporary loss of vision in one eye:     Problems with dizziness:         Gastrointestinal    Blood in stool:     Vomited blood:         Genitourinary    Burning when urinating:     Blood in urine:        Psychiatric    Major depression:         Hematologic    Bleeding problems:    Problems with blood clotting too easily:        Skin    Rashes or ulcers:        Constitutional    Fever or chills:      PHYSICAL EXAM: Vitals:   08/27/21 1138  BP: 125/72  Pulse: 84  Resp: 16  Temp: 98.4 F (36.9 C)  TempSrc: Temporal  SpO2: 95%  Weight: 101 lb (45.8 kg)  Height: 5\' 2"  (1.575 m)    GENERAL: The patient is a well-nourished female, in no acute distress. The vital signs are documented above. CARDIOVASCULAR: 2+ radial and 2+ femoral pulses bilaterally.  No abdominal bruit noted.  No abdominal tenderness PULMONARY: There is good air exchange  MUSCULOSKELETAL: There are no major deformities or cyanosis. NEUROLOGIC: No focal weakness or paresthesias are detected. SKIN: There are no ulcers or rashes noted. PSYCHIATRIC: The patient has a normal affect.  DATA:  I reviewed her CT scan from February 2023 and also most recent CT scan from 1 month ago.  She does have diffuse atherosclerotic disease in her aortoiliac segments but no evidence of stenosis.  She has very mild narrowing at the origin of her celiac and superior mesenteric arteries.  Radiologist interpretation is of median arcuate ligament compression.  This does not appear to be classic for this from my standpoint.  Certainly has no critical stenosis.  Her IMA is also patent.  The radiologist also makes note of some compression of her left renal vein as it takes its normal course across the aorta  MEDICAL ISSUES: I discussed these findings at length with the patient.  She certainly does not have any evidence of reduced flow through her mesenteric vessels.   She may have mild median arcuate compression but this seems to be insignificant.  I did explain treatment for median arcuate ligament which  would require surgical division of the diaphragmatic crus with either open or laparoscopic surgery.  I certainly would not recommend this since I feel that it is very unlikely that this has anything to do with her symptoms.  She does report that she has had some improvement with probiotic use and I have encouraged her to continue this.  She was reassured with this discussion and will see [place] again on an as-needed basis   Larina Earthly, MD [facility] [place] [facility] [phone] Pager [phone]  Note: Portions of this report may have been transcribed using voice recognition software.  Every effort has been made to ensure accuracy; however, inadvertent computerized transcription errors may still be present.

## 2021-09-18 IMAGING — CT CT CHEST HIGH RESOLUTION W/O CM
3 of 5 series · 14 of 36 positions shown, 15 images · non-contrast
Comparison: 11/14/2011

CLINICAL DATA: Bronchiectasis, smoker, cough

EXAM:
CT CHEST WITHOUT CONTRAST
TECHNIQUE: Multidetector CT imaging of the chest was performed following the
standard protocol without intravenous contrast. High resolution
imaging of the lungs, as well as inspiratory and expiratory imaging,
was performed.

[Series 3: standard chest · axial · 0.57mm/px · z∈[+1268,+1530]mm · 8 of 163 slices shown]
[im 16/163  mediastinal]
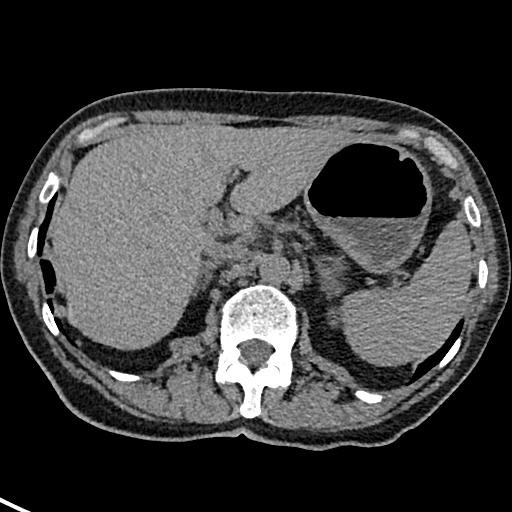
[im 31/163  mediastinal]
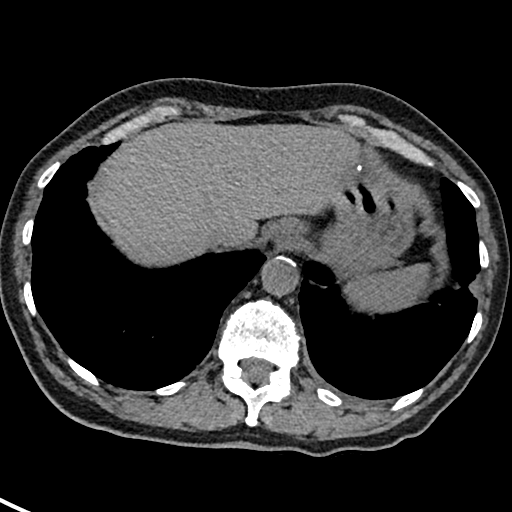
[im 55/163  mediastinal]
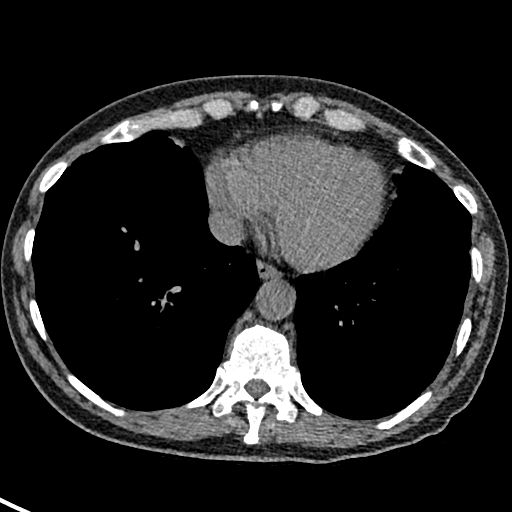
[im 70/163  mediastinal]
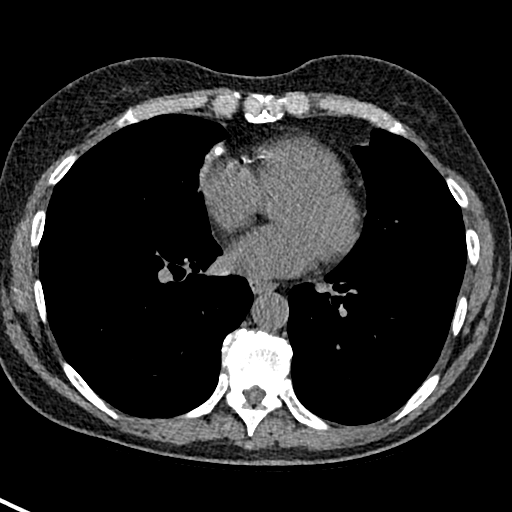
[im 93/163  mediastinal]
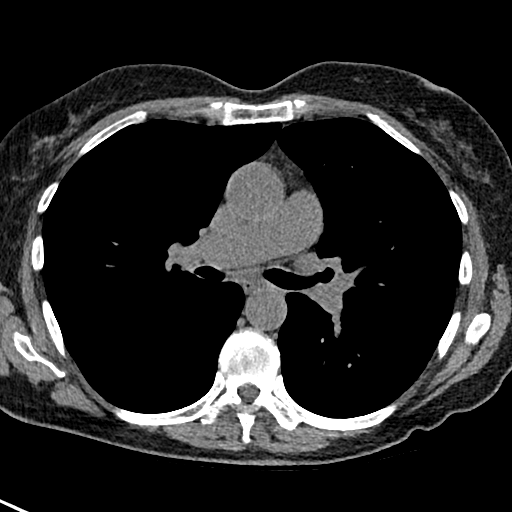
[im 109/163  mediastinal]
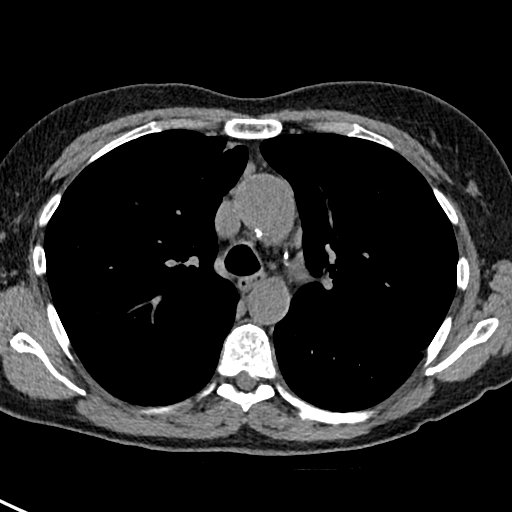
[im 132/163  mediastinal]
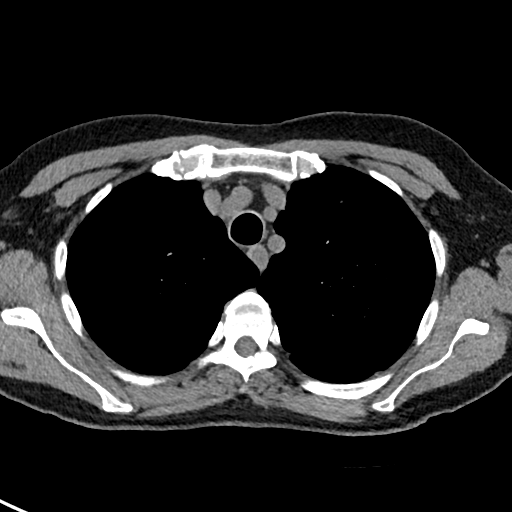
[im 147/163  mediastinal]
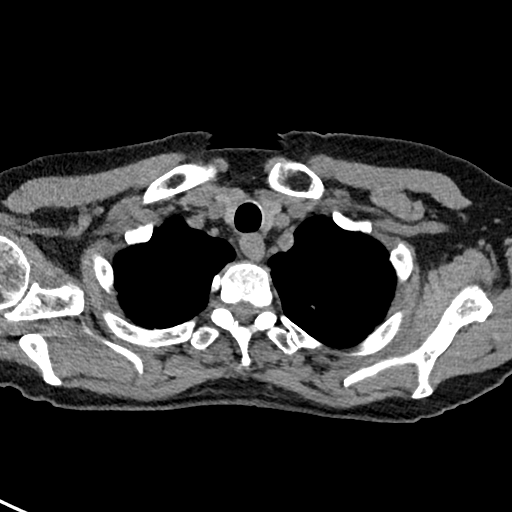

[Series 4: high res insp · axial · 0.58mm/px · z∈[+1230,+1562]mm · 3 of 24 slices shown, 4 images]
[im 1/24  mediastinal]
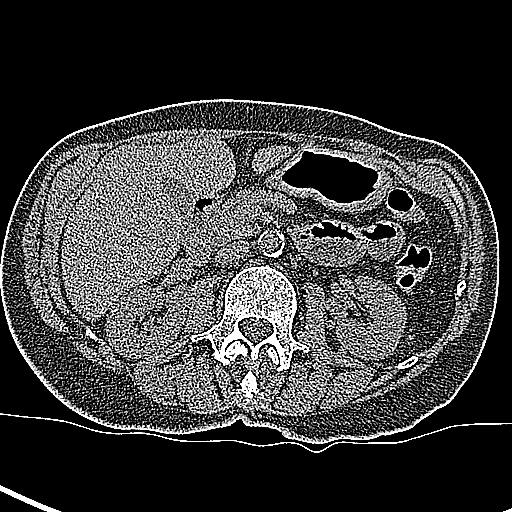
[im 1/24  lung]
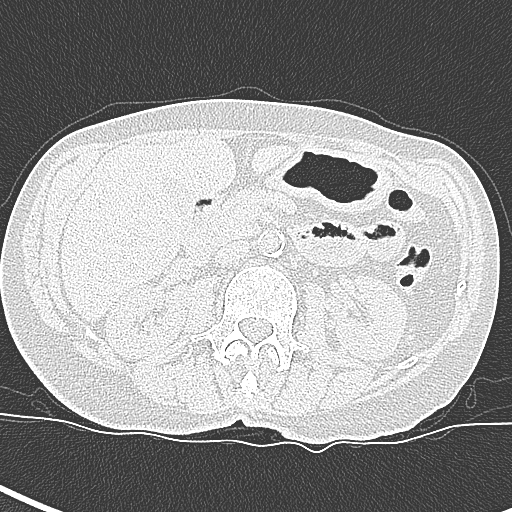
[im 12/24  lung]
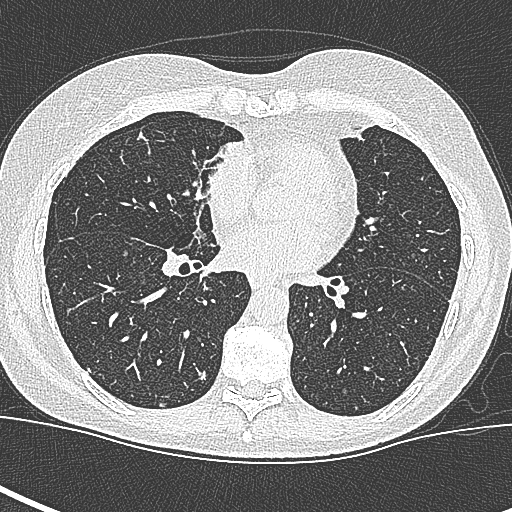
[im 24/24  lung]
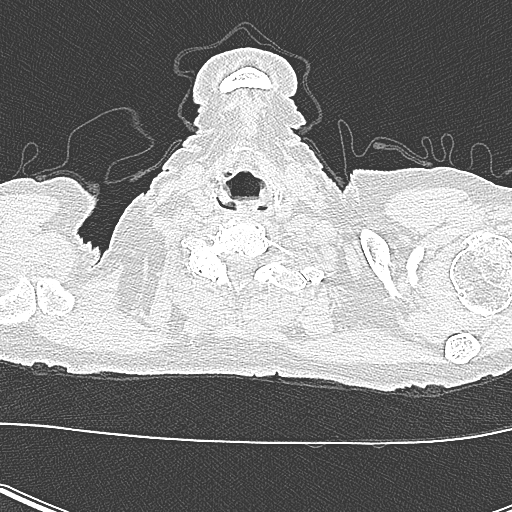

[Series 9: coronal · coronal · 0.58mm/px · 3 of 110 slices shown]
[im 22/110  lung]
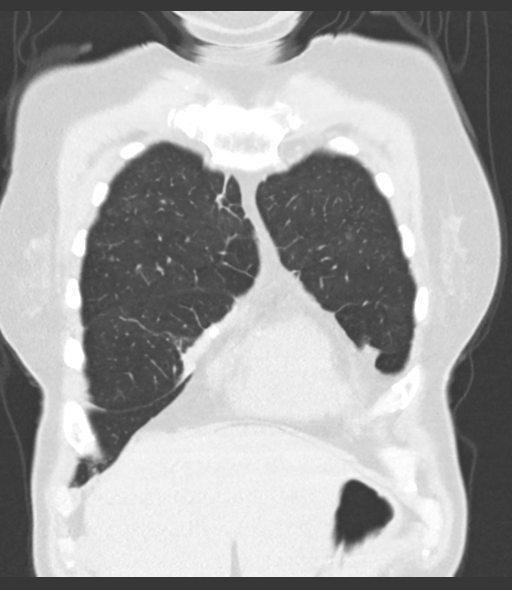
[im 44/110  lung]
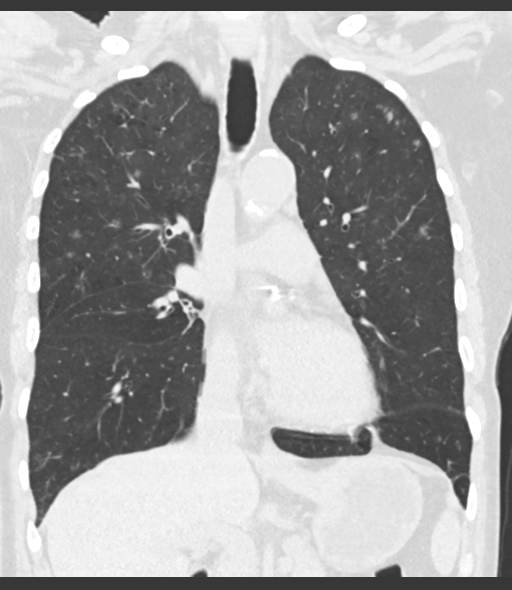
[im 66/110  lung]
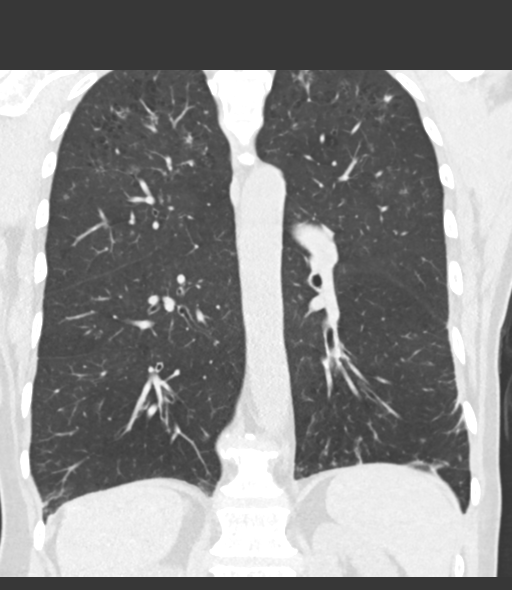

[14 of 36 positions shown; findings below may reference images not displayed]

FINDINGS: Cardiovascular: Aortic atherosclerosis. Normal heart size. Scattered
coronary artery calcifications. No pericardial effusion.

Mediastinum/Nodes: No enlarged mediastinal, hilar, or axillary lymph
nodes. Thyroid gland, trachea, and esophagus demonstrate no
significant findings.

Lungs/Pleura: Mild centrilobular emphysema. Diffuse bilateral
bronchial wall thickening. There are innumerable new pulmonary
nodules throughout the lungs, most concentrated in the upper lobes.
The majority of these nodules are ground-glass in character, however
several are solid and spiculated in morphology, the largest in the
posterior left upper lobe measuring 1.0 x 0.9 cm (series 7, image
26) and in the right upper lobe measuring 0.9 x 0.6 cm (series 7,
image 41). Small consolidations of the medial segment right middle
lobe and lingula, similar to prior examination. No significant air
trapping on expiratory phase imaging. No pleural effusion or
pneumothorax.

Upper Abdomen: No acute abnormality.

Musculoskeletal: No chest wall mass or suspicious bone lesions
identified.
IMPRESSION: 1. There are innumerable new pulmonary nodules throughout the lungs,
most concentrated in the upper lobes. The majority of these nodules
are ground-glass in character, however several are solid and
spiculated in morphology, the largest in the posterior left upper
lobe measuring 1.0 x 0.9 cm and in the right upper lobe measuring
0.9 x 0.6 cm. Multiplicity and distribution are generally most
suggestive of atypical infection, however morphology of the largest
solid nodules is concerning for malignancy. Recommend initial
follow-up CT in 3 months to assess for stability or resolution, and
it may be further necessary to characterize the largest nodules for
FDG avidity by PET-CT. Recommend multidisciplinary thoracic referral
for further evaluation.
2. Small consolidations of the medial segment right middle lobe and
lingula, similar to prior examination and consistent with sequelae
of prior atypical infection, including atypical mycobacterium.
3. Diffuse bilateral bronchial wall thickening, consistent with
nonspecific infectious or inflammatory bronchitis.
4. No significant bronchiectasis.
5. Emphysema.
6. Coronary artery disease. Aortic atherosclerosis.

Aortic Atherosclerosis (6482Y-S6J.J) and Emphysema (6482Y-XRI.U).

## 2021-09-30 DIAGNOSIS — E782 Mixed hyperlipidemia: Secondary | ICD-10-CM | POA: Diagnosis not present

## 2021-10-07 DIAGNOSIS — R1310 Dysphagia, unspecified: Secondary | ICD-10-CM | POA: Diagnosis not present

## 2021-10-07 DIAGNOSIS — I10 Essential (primary) hypertension: Secondary | ICD-10-CM | POA: Diagnosis not present

## 2021-10-07 DIAGNOSIS — R69 Illness, unspecified: Secondary | ICD-10-CM | POA: Diagnosis not present

## 2021-10-07 DIAGNOSIS — R0989 Other specified symptoms and signs involving the circulatory and respiratory systems: Secondary | ICD-10-CM | POA: Diagnosis not present

## 2021-10-07 DIAGNOSIS — J449 Chronic obstructive pulmonary disease, unspecified: Secondary | ICD-10-CM | POA: Diagnosis not present

## 2021-10-07 DIAGNOSIS — Z5320 Procedure and treatment not carried out because of patient's decision for unspecified reasons: Secondary | ICD-10-CM | POA: Diagnosis not present

## 2021-10-07 DIAGNOSIS — E782 Mixed hyperlipidemia: Secondary | ICD-10-CM | POA: Diagnosis not present

## 2021-10-07 DIAGNOSIS — R944 Abnormal results of kidney function studies: Secondary | ICD-10-CM | POA: Diagnosis not present

## 2021-10-09 DIAGNOSIS — I10 Essential (primary) hypertension: Secondary | ICD-10-CM | POA: Diagnosis not present

## 2021-10-09 DIAGNOSIS — E782 Mixed hyperlipidemia: Secondary | ICD-10-CM | POA: Diagnosis not present

## 2021-10-09 DIAGNOSIS — J449 Chronic obstructive pulmonary disease, unspecified: Secondary | ICD-10-CM | POA: Diagnosis not present

## 2021-10-28 ENCOUNTER — Ambulatory Visit: Payer: Medicare HMO | Admitting: Internal Medicine

## 2021-10-29 ENCOUNTER — Encounter (HOSPITAL_COMMUNITY): Payer: Self-pay | Admitting: *Deleted

## 2021-10-29 ENCOUNTER — Emergency Department (HOSPITAL_COMMUNITY)
Admission: EM | Admit: 2021-10-29 | Discharge: 2021-10-29 | Discharge status: Against Medical Advice | Payer: Medicare HMO | Attending: Emergency Medicine | Admitting: Emergency Medicine

## 2021-10-29 ENCOUNTER — Emergency Department (HOSPITAL_COMMUNITY): Payer: Medicare HMO

## 2021-10-29 ENCOUNTER — Other Ambulatory Visit: Payer: Self-pay

## 2021-10-29 DIAGNOSIS — J181 Lobar pneumonia, unspecified organism: Secondary | ICD-10-CM | POA: Diagnosis not present

## 2021-10-29 DIAGNOSIS — J189 Pneumonia, unspecified organism: Secondary | ICD-10-CM | POA: Diagnosis not present

## 2021-10-29 DIAGNOSIS — J90 Pleural effusion, not elsewhere classified: Secondary | ICD-10-CM | POA: Diagnosis not present

## 2021-10-29 DIAGNOSIS — Z7982 Long term (current) use of aspirin: Secondary | ICD-10-CM | POA: Insufficient documentation

## 2021-10-29 DIAGNOSIS — J441 Chronic obstructive pulmonary disease with (acute) exacerbation: Secondary | ICD-10-CM

## 2021-10-29 DIAGNOSIS — R0602 Shortness of breath: Secondary | ICD-10-CM | POA: Diagnosis not present

## 2021-10-29 DIAGNOSIS — J439 Emphysema, unspecified: Secondary | ICD-10-CM | POA: Diagnosis not present

## 2021-10-29 LAB — COMPREHENSIVE METABOLIC PANEL
ALT: 20 U/L (ref 0–44)
AST: 43 U/L — ABNORMAL HIGH (ref 15–41)
Albumin: 3.7 g/dL (ref 3.5–5.0)
Alkaline Phosphatase: 71 U/L (ref 38–126)
Anion gap: 9 (ref 5–15)
BUN: 13 mg/dL (ref 8–23)
CO2: 30 mmol/L (ref 22–32)
Calcium: 9.4 mg/dL (ref 8.9–10.3)
Chloride: 99 mmol/L (ref 98–111)
Creatinine, Ser: 0.73 mg/dL (ref 0.44–1.00)
GFR, Estimated: >60 mL/min (ref >60)
Glucose, Bld: 122 mg/dL — ABNORMAL HIGH (ref 70–99)
Potassium: 3.4 mmol/L — ABNORMAL LOW (ref 3.5–5.1)
Sodium: 138 mmol/L (ref 135–145)
Total Bilirubin: 0.7 mg/dL (ref 0.3–1.2)
Total Protein: 7.5 g/dL (ref 6.5–8.1)

## 2021-10-29 LAB — CBC WITH DIFFERENTIAL/PLATELET
Abs Immature Granulocytes: 0.03 10*3/uL (ref 0.00–0.07)
Basophils Absolute: 0 10*3/uL (ref 0.0–0.1)
Basophils Relative: 0 %
Eosinophils Absolute: 0 10*3/uL (ref 0.0–0.5)
Eosinophils Relative: 0 %
HCT: 40.6 % (ref 36.0–46.0)
Hemoglobin: 13.6 g/dL (ref 12.0–15.0)
Immature Granulocytes: 0 %
Lymphocytes Relative: 13 %
Lymphs Abs: 1.4 10*3/uL (ref 0.7–4.0)
MCH: 31.6 pg (ref 26.0–34.0)
MCHC: 33.5 g/dL (ref 30.0–36.0)
MCV: 94.2 fL (ref 80.0–100.0)
Monocytes Absolute: 0.7 10*3/uL (ref 0.1–1.0)
Monocytes Relative: 7 %
Neutro Abs: 8.6 10*3/uL — ABNORMAL HIGH (ref 1.7–7.7)
Neutrophils Relative %: 80 %
Platelets: 157 10*3/uL (ref 150–400)
RBC: 4.31 MIL/uL (ref 3.87–5.11)
RDW: 13.7 % (ref 11.5–15.5)
WBC: 10.7 10*3/uL — ABNORMAL HIGH (ref 4.0–10.5)
nRBC: 0 % (ref 0.0–0.2)

## 2021-10-29 MED ORDER — MAGNESIUM SULFATE 2 GM/50ML IV SOLN
2.0000 g | Freq: Once | INTRAVENOUS | Status: AC
  Administered 2021-10-29: 2 g via INTRAVENOUS
  Filled 2021-10-29: qty 50

## 2021-10-29 MED ORDER — OXYCODONE-ACETAMINOPHEN 5-325 MG PO TABS
1.0000 | ORAL_TABLET | Freq: Once | ORAL | Status: AC
  Administered 2021-10-29: 1 via ORAL
  Filled 2021-10-29: qty 1

## 2021-10-29 MED ORDER — IPRATROPIUM-ALBUTEROL 0.5-2.5 (3) MG/3ML IN SOLN
3.0000 mL | Freq: Once | RESPIRATORY_TRACT | Status: AC
  Administered 2021-10-29: 3 mL via RESPIRATORY_TRACT
  Filled 2021-10-29: qty 3

## 2021-10-29 MED ORDER — LEVOFLOXACIN IN D5W 500 MG/100ML IV SOLN
500.0000 mg | Freq: Once | INTRAVENOUS | Status: AC
  Administered 2021-10-29: 500 mg via INTRAVENOUS
  Filled 2021-10-29: qty 100

## 2021-10-29 MED ORDER — METHYLPREDNISOLONE SODIUM SUCC 125 MG IJ SOLR
125.0000 mg | Freq: Once | INTRAMUSCULAR | Status: AC
  Administered 2021-10-29: 125 mg via INTRAVENOUS
  Filled 2021-10-29: qty 2

## 2021-10-29 MED ORDER — SODIUM CHLORIDE 0.9 % IV BOLUS
500.0000 mL | Freq: Once | INTRAVENOUS | Status: AC
  Administered 2021-10-29: 500 mL via INTRAVENOUS

## 2021-10-29 MED ORDER — ALBUTEROL SULFATE (2.5 MG/3ML) 0.083% IN NEBU
2.5000 mg | INHALATION_SOLUTION | Freq: Once | RESPIRATORY_TRACT | Status: AC
  Administered 2021-10-29: 2.5 mg via RESPIRATORY_TRACT
  Filled 2021-10-29: qty 3

## 2021-10-29 NOTE — Discharge Instructions (Signed)
Return if needed

## 2021-10-29 NOTE — ED Provider Notes (Signed)
[facility] EMERGENCY DEPARTMENT Provider Note   CSN: [id number] Arrival date & time: 10/29/21  1728     History  Chief Complaint  Patient presents with   Shortness of Breath    Karen Church is a 71 y.o. female.  Patient complains of shortness of breath.  She has a history of tobacco abuse.  Also says she has been coughing a lot  The history is provided by the patient and medical records. No language interpreter was used.  Shortness of Breath Severity:  Moderate Onset quality:  Sudden Timing:  Constant Progression:  Worsening Chronicity:  Recurrent Context: activity   Relieved by:  Nothing Worsened by:  Nothing Ineffective treatments:  None tried Associated symptoms: no abdominal pain, no chest pain, no cough, no headaches and no rash        Home Medications Prior to Admission medications   Medication Sig Start Date End Date Taking? Authorizing Provider  Ascorbic Acid (VITAMIN C PO) Take 1 tablet by mouth daily.   Yes [provider]  aspirin EC 81 MG tablet Take 81 mg by mouth daily.    Yes [provider]  lactobacillus acidophilus (BACID) TABS tablet Take 2 tablets by mouth daily as needed (stomach).   Yes [provider]  Multiple Vitamin (MULTIVITAMIN WITH MINERALS) TABS Take 1 tablet by mouth daily.   Yes [provider]  TRELEGY ELLIPTA 100-62.5-25 MCG/INH AEPB Inhale 1 puff into the lungs daily. 12/21/19  Yes [provider]  doxycycline (VIBRA-TABS) 100 MG tablet Take 100 mg by mouth 2 (two) times daily. Patient not taking: Reported on 10/29/2021 08/13/21   [provider]      Allergies    Lasix [furosemide]    Review of Systems   Review of Systems  Constitutional:  Negative for appetite change and fatigue.  HENT:  Negative for congestion, ear discharge and sinus pressure.   Eyes:  Negative for discharge.  Respiratory:  Positive for shortness of breath. Negative for cough.   Cardiovascular:   Negative for chest pain.  Gastrointestinal:  Negative for abdominal pain and diarrhea.  Genitourinary:  Negative for frequency and hematuria.  Musculoskeletal:  Negative for back pain.  Skin:  Negative for rash.  Neurological:  Negative for seizures and headaches.  Psychiatric/Behavioral:  Negative for hallucinations.     Physical Exam Updated Vital Signs BP (!) 127/54   Pulse 80   Temp 97.9 F (36.6 C) (Oral)   Resp (!) 25   Ht 5\' 2"  (1.575 m)   Wt 44 kg   SpO2 (!) 89%   BMI 17.74 kg/m  Physical Exam Vitals and nursing note reviewed.  Constitutional:      Appearance: She is well-developed.  HENT:     Head: Normocephalic.     Nose: Nose normal.  Eyes:     General: No scleral icterus.    Conjunctiva/sclera: Conjunctivae normal.  Neck:     Thyroid: No thyromegaly.  Cardiovascular:     Rate and Rhythm: Normal rate and regular rhythm.     Heart sounds: No murmur heard.    No friction rub. No gallop.  Pulmonary:     Breath sounds: No stridor. Wheezing present. No rales.  Chest:     Chest wall: No tenderness.  Abdominal:     General: There is no distension.     Tenderness: There is no abdominal tenderness. There is no rebound.  Musculoskeletal:        General: Normal range of  motion.     Cervical back: Neck supple.  Lymphadenopathy:     Cervical: No cervical adenopathy.  Skin:    Findings: No erythema or rash.  Neurological:     Mental Status: She is alert and oriented to person, place, and time.     Motor: No abnormal muscle tone.     Coordination: Coordination normal.  Psychiatric:        Behavior: Behavior normal.     ED Results / Procedures / Treatments   Labs (all labs ordered are listed, but only abnormal results are displayed) Labs Reviewed  CBC WITH DIFFERENTIAL/PLATELET - Abnormal; Notable for the following components:      Result Value   WBC 10.7 (*)    Neutro Abs 8.6 (*)    All other components within normal limits  COMPREHENSIVE METABOLIC  PANEL - Abnormal; Notable for the following components:   Potassium 3.4 (*)    Glucose, Bld 122 (*)    AST 43 (*)    All other components within normal limits    EKG None  Radiology DG Chest Port 1 View  Result Date: 10/29/2021 CLINICAL DATA:  Shortness of breath. EXAM: PORTABLE CHEST 1 VIEW COMPARISON:  Chest radiograph dated 08/04/2021. FINDINGS: Background of emphysema. Right lung base reticulonodular densities, new since the prior radiograph and concerning for developing infiltrate. Clinical correlation is recommended. Trace right pleural effusion. No pneumothorax. The cardiac silhouette is within normal limits. Atherosclerotic calcification of the aorta. No acute osseous pathology. IMPRESSION: 1. Right lung base developing infiltrate. Trace right pleural effusion. 2. Emphysema. Electronically Signed   By: Anner Crete M.D.   On: 10/29/2021 18:26    Procedures Procedures    Medications Ordered in ED Medications  magnesium sulfate IVPB 2 g 50 mL (0 g Intravenous Stopped 10/29/21 2012)  ipratropium-albuterol (DUONEB) 0.5-2.5 (3) MG/3ML nebulizer solution 3 mL (3 mLs Nebulization Given 10/29/21 1841)  albuterol (PROVENTIL) (2.5 MG/3ML) 0.083% nebulizer solution 2.5 mg (2.5 mg Nebulization Given 10/29/21 1841)  methylPREDNISolone sodium succinate (SOLU-MEDROL) 125 mg/2 mL injection 125 mg (125 mg Intravenous Given 10/29/21 1904)  oxyCODONE-acetaminophen (PERCOCET/ROXICET) 5-325 MG per tablet 1 tablet (1 tablet Oral Given 10/29/21 1903)  sodium chloride 0.9 % bolus 500 mL (0 mLs Intravenous Stopped 10/29/21 1951)  levofloxacin (LEVAQUIN) IVPB 500 mg (0 mg Intravenous Stopped 10/29/21 2135)    ED Course/ Medical Decision Making/ A&P                           Medical Decision Making Amount and/or Complexity of Data Reviewed Labs: ordered. Radiology: ordered. ECG/medicine tests: ordered.  Risk Prescription drug management.  This patient presents to the ED for concern of shortness  of breath, this involves an extensive number of treatment options, and is a complaint that carries with it a high risk of complications and morbidity.  The differential diagnosis includes COPD, pneumonia, PE   Co morbidities that complicate the patient evaluation  COPD   Additional history obtained:  Additional history obtained from family External records from outside source obtained and reviewed including hospital records   Lab Tests:  I Ordered, and personally interpreted labs.  The pertinent results include: CBC and chemistry show white count 10.7 glucose 122   Imaging Studies ordered:  I ordered imaging studies including chest x-ray I independently visualized and interpreted imaging which showed pneumonia and COPD I agree with the radiologist interpretation   Cardiac Monitoring: / EKG:  The patient was  maintained on a cardiac monitor.  I personally viewed and interpreted the cardiac monitored which showed an underlying rhythm of: Sinus tach   Consultations Obtained:  No consultant Problem List / ED Course / Critical interventions / Medication management  Pneumonia and COPD I ordered medication including albuterol and steroids and Levaquin Reevaluation of the patient after these medicines showed that the patient improved I have reviewed the patients home medicines and have made adjustments as needed   Social Determinants of Health:  None   Test / Admission - Considered:  None  Patient with pneumonia, hypoxia from COPD and pneumonia.  She improved with neb treatments and prednisone.  Patient's O2 sat was still in the 80s and she was told she should be admitted to the hospital.  I explained that if she goes home she will most likely get worse and when she comes back she will have to stay in the hospital a lot longer.  I also told the patient that if she is not admitted today that she could possibly get so sick that she could die.        Final Clinical  Impression(s) / ED Diagnoses Final diagnoses:  COPD exacerbation (HCC)  Community acquired pneumonia of right lower lobe of lung    Rx / DC Orders ED Discharge Orders     None         Bethann Berkshire, MD 11/01/21 1255

## 2021-10-29 NOTE — ED Triage Notes (Signed)
Pt with mild SOB and right flank pain.  Hurts to cough.  Pt states her RA sats are low at home, states she has been 79% pt is 86% on RA in triage.

## 2021-10-30 DIAGNOSIS — J189 Pneumonia, unspecified organism: Secondary | ICD-10-CM | POA: Diagnosis not present

## 2021-11-03 ENCOUNTER — Ambulatory Visit (HOSPITAL_COMMUNITY): Admission: RE | Admit: 2021-11-03 | Payer: Medicare HMO | Source: Ambulatory Visit

## 2021-11-05 DIAGNOSIS — J189 Pneumonia, unspecified organism: Secondary | ICD-10-CM | POA: Diagnosis not present

## 2021-11-05 DIAGNOSIS — Z0001 Encounter for general adult medical examination with abnormal findings: Secondary | ICD-10-CM | POA: Diagnosis not present

## 2021-11-08 DIAGNOSIS — E782 Mixed hyperlipidemia: Secondary | ICD-10-CM | POA: Diagnosis not present

## 2021-11-08 DIAGNOSIS — I10 Essential (primary) hypertension: Secondary | ICD-10-CM | POA: Diagnosis not present

## 2021-11-08 DIAGNOSIS — J449 Chronic obstructive pulmonary disease, unspecified: Secondary | ICD-10-CM | POA: Diagnosis not present

## 2021-11-10 ENCOUNTER — Telehealth: Payer: Self-pay

## 2021-11-10 NOTE — Telephone Encounter (Signed)
        Patient  visited [facility] on 9/20   Telephone encounter attempt :  1st  A HIPAA compliant voice message was left requesting a return call.  Instructed patient to call back    Annalyse Langlais Pop Health Care Guide, [place], Care Management  [phone] [address], [place], [place] [address] Phone: [phone] Email: [email or web address]       

## 2021-11-18 ENCOUNTER — Encounter (HOSPITAL_COMMUNITY): Payer: Self-pay

## 2021-11-18 ENCOUNTER — Ambulatory Visit (HOSPITAL_COMMUNITY): Payer: Medicare HMO

## 2021-11-20 ENCOUNTER — Ambulatory Visit: Payer: Medicare HMO | Admitting: Internal Medicine

## 2021-12-02 ENCOUNTER — Encounter: Payer: Self-pay | Admitting: Gastroenterology

## 2021-12-02 ENCOUNTER — Ambulatory Visit (INDEPENDENT_AMBULATORY_CARE_PROVIDER_SITE_OTHER): Payer: Medicare HMO | Admitting: Gastroenterology

## 2021-12-02 DIAGNOSIS — R09A2 Foreign body sensation, throat: Secondary | ICD-10-CM | POA: Diagnosis not present

## 2021-12-02 MED ORDER — PANTOPRAZOLE SODIUM 40 MG PO TBEC: 40.0000 mg | DELAYED_RELEASE_TABLET | Freq: Every day | ORAL | 3 refills | Status: DC

## 2021-12-02 NOTE — Progress Notes (Signed)
Gastroenterology Office Note     Primary Care Physician:  Benita Stabile, MD  Primary Gastroenterologist: Dr. Marletta Lor    Chief Complaint   Chief Complaint  Patient presents with   Follow-up    Weight loss     History of Present Illness   Karen Church is a 71 y.o. female presenting today in follow-up with a history of postprandial abdominal pain, unexplained weight loss, concern for black stool, dysphagia.   CTA with mild stenosis of proximal celiac trunk and SMA with concern for median arcuate ligament compression. (MALS). She saw Dr. 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 Surgery not recommended at this point and feels her pain is not related to this.   EGD July 2023: mild Schatzki ring s/p dilation, gastritis s/p biopsy. Negative H.pylori.   Dysphagia resolved. Now with globus sensation and mucus in throat. Crystal clear. Not on a PPI.   Abdominal pain improved. Eating improved. Has some gas after eating. Went to [employer] after church and ate her whole [employer].  Will cramp prior to BM and then better. No overt GI Bleeding.     Past Medical History:  Diagnosis Date   Carpal tunnel syndrome    COPD (chronic obstructive pulmonary disease) (HCC)     Past Surgical History:  Procedure Laterality Date   ABDOMINAL HYSTERECTOMY     BALLOON DILATION N/A 08/11/2021   Procedure: BALLOON DILATION;  Surgeon: Lanelle Bal, DO;  [facility];  Service: Endoscopy;  Laterality: N/A;   BIOPSY  12/10/2015   Procedure: BIOPSY;  Surgeon: Corbin Ade, MD;  [facility];  Service: Endoscopy;;  Gastric    BIOPSY  08/11/2021   Procedure: BIOPSY;  Surgeon: Lanelle Bal, DO;  [facility];  Service: Endoscopy;;  gastric    CESAREAN SECTION     COLONOSCOPY N/A 12/10/2015   normal   ESOPHAGOGASTRODUODENOSCOPY N/A 12/10/2015   normal esophagus s/p dilation, +H.pylori    ESOPHAGOGASTRODUODENOSCOPY (EGD) WITH PROPOFOL N/A 08/11/2021   Procedure: ESOPHAGOGASTRODUODENOSCOPY (EGD) WITH PROPOFOL;  Surgeon: Lanelle Bal, DO;  [facility];  Service: Endoscopy;  Laterality: N/A;  2:15pm   MALONEY DILATION N/A 12/10/2015   Procedure: [name] DILATION;  Surgeon: Corbin Ade, MD;  [facility];  Service: Endoscopy;  Laterality: N/A;   SKIN CANCER EXCISION     SMALL INTESTINE SURGERY N/A 1967   Resection    Current Outpatient Medications  Medication Sig Dispense Refill   Ascorbic Acid (VITAMIN C PO) Take 1 tablet by mouth daily.     aspirin EC 81 MG tablet Take 81 mg by mouth daily.      Multiple Vitamin (MULTIVITAMIN WITH MINERALS) TABS Take 1 tablet by mouth daily.     pantoprazole (PROTONIX) 40 MG tablet Take 1 tablet (40 mg total) by mouth daily. Take 30 minutes before breakfast 90 tablet 3   TRELEGY ELLIPTA 100-62.5-25 MCG/INH AEPB Inhale 1 puff into the lungs daily.     No current facility-administered medications for this visit.    Allergies as of 12/02/2021 - Review Complete 12/02/2021  Allergen Reaction Noted   Lasix [furosemide] Swelling 09/14/2018    Family History  Problem Relation Age of Onset   Colon polyps Sister    Colon polyps Brother    Colon cancer Neg Hx     Social History   Socioeconomic History  Marital status: Married    Spouse name: Not on file   Number of children: Not on file   Years of education: Not on file   Highest education level: Not on file  Occupational History   Not on file  Tobacco Use   Smoking status: Every Day    Packs/day: 0.50    Years: 50.00    Total pack years: 25.00    Types: Cigarettes    Passive exposure: Current   Smokeless tobacco: Never   Tobacco comments:    currently at 1/2 ppd 12/26/19//lmr  Vaping Use   Vaping Use: Never used  Substance and Sexual Activity   Alcohol use: No   Drug use: No   Sexual activity: Yes  Other Topics Concern   Not on file  Social  History Narrative   Not on file   Social Determinants of Health   Financial Resource Strain: Not on file  Food Insecurity: Not on file  Transportation Needs: Not on file  Physical Activity: Not on file  Stress: Not on file  Social Connections: Not on file  Intimate Partner Violence: Not on file     Review of Systems   Gen: Denies any fever, chills, fatigue, weight loss, lack of appetite.  CV: Denies chest pain, heart palpitations, peripheral edema, syncope.  Resp: Denies shortness of breath at rest or with exertion. Denies wheezing or cough.  GI: Denies dysphagia or odynophagia. Denies jaundice, hematemesis, fecal incontinence. GU : Denies urinary burning, urinary frequency, urinary hesitancy MS: Denies joint pain, muscle weakness, cramps, or limitation of movement.  Derm: Denies rash, itching, dry skin Psych: Denies depression, anxiety, memory loss, and confusion Heme: Denies bruising, bleeding, and enlarged lymph nodes.   Physical Exam   BP (!) 152/72   Pulse 87   Temp 98 F (36.7 C)   Ht 5\' 2"  (1.575 m)   Wt 102 lb 9.6 oz (46.5 kg)   BMI 18.77 kg/m  General:   Alert and oriented. Pleasant and cooperative. Well-nourished and well-developed.  Head:  Normocephalic and atraumatic. Eyes:  Without icterus Abdomen:  +BS, soft, non-tender and non-distended. No HSM noted. No guarding or rebound. No masses appreciated.  Rectal:  Deferred  Msk:  Symmetrical without gross deformities. Normal posture. Extremities:  Without edema. Neurologic:  Alert and  oriented x4;  grossly normal neurologically. Skin:  Intact without significant lesions or rashes. Psych:  Alert and cooperative. Normal mood and affect.   Assessment   Karen Church is a 71 y.o. female presenting today in follow-up with a history of postprandial abdominal pain, unexplained weight loss, concern for black stool, dysphagia.   Abdominal pain: CTA with mild stenosis of proximal celiac trunk and SMA with  concern for median arcuate ligament compression. (MALS). She saw Dr. 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 Surgery not recommended at this point and feels her pain is not related to this. EGD with gastritis, negative H.pylori. Abdominal pain improved. Appetite improved.   Dysphagia: resolved. Now with globus sensation, mucus. Not on PPI. Start pantoprazole.   Weight loss: stable.    PLAN    Start pantoprazole once daily Close follow-up in 6 weeks    Annitta Needs, PhD, [facility] Gastroenterology

## 2021-12-02 NOTE — Patient Instructions (Signed)
I would like for you to take pantoprazole once a day, 30 minutes before breakfast.  I would like to see you in 6 weeks!   I enjoyed seeing you again today! As you know, I value our relationship and want to provide genuine, compassionate, and quality care. I welcome your feedback. If you receive a survey regarding your visit,  I greatly appreciate you taking time to fill this out. See you next time!  Annitta Needs, PhD, ANP-BC [facility] Gastroenterology

## 2021-12-29 ENCOUNTER — Encounter: Payer: Self-pay | Admitting: Internal Medicine

## 2021-12-29 ENCOUNTER — Ambulatory Visit: Payer: Medicare HMO | Admitting: Internal Medicine

## 2021-12-29 VITALS — BP 142/78 | HR 102 | Temp 98.2°F | Ht 62.0 in | Wt 102.6 lb

## 2021-12-29 DIAGNOSIS — R69 Illness, unspecified: Secondary | ICD-10-CM | POA: Diagnosis not present

## 2021-12-29 DIAGNOSIS — F17210 Nicotine dependence, cigarettes, uncomplicated: Secondary | ICD-10-CM | POA: Diagnosis not present

## 2021-12-29 DIAGNOSIS — J449 Chronic obstructive pulmonary disease, unspecified: Secondary | ICD-10-CM | POA: Diagnosis not present

## 2021-12-29 DIAGNOSIS — A159 Respiratory tuberculosis unspecified: Secondary | ICD-10-CM

## 2021-12-29 MED ORDER — ALBUTEROL SULFATE HFA 108 (90 BASE) MCG/ACT IN AERS: 2.0000 | INHALATION_SPRAY | RESPIRATORY_TRACT | 2 refills | Status: AC | PRN

## 2021-12-29 NOTE — Progress Notes (Addendum)
Karen Church, female    DOB: April 10, 1950    MRN: [id number]   Brief patient profile:  52 yowf active smoker with pattern of acute flares of cough with purulent sptum and pleuritic pain usually same area most noted in back, sometimes in front with most recent cxr's form 11/21/19 showing persistent RML atx/ nodular changes R base so referred to pulmonary clinic in [place]  12/26/2019 by [name]      History of Present Illness  12/26/2019  Pulmonary/ 1st office eval/ Janene Yousuf / [place] [facility]  Chief Complaint  Patient presents with   Pulmonary Consult    Referred by [name], NP for eval of abnormal cxr. Pt states has had PNA multiple times since 2008.  She had PNA last in Oct 2021- has had some weakness since then but overall her breathing is doing well. She has prod cough with clear sputum.  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 Continue trelegy daily  Nebulizer (machine) can be used up to every 4 hours if needed  Please schedule a follow up visit in 3 months but call   with PFTs on return   I very strongly recommend you get the moderna or pfizer vaccine      02/21/2020  f/u ov/[place] office/Missey Hasley re:  ?MAI   / maint trelegy 100   Chief Complaint  Patient presents with   Follow-up    Patient has a productive cough with white foamy sputum. Denies shortness of breath.   Dyspnea: no change activity tol  Cough:  white/ foamy worse in am  Sleeping: mostly in recliner about 30 degrees SABA use: rarely neb 02: none  Rec We need to be sure   your pfts are done  prior to your next appt > not done as of 12/29/2021  We will give you a specimen cup to return to the lab first thing in am - nasty mucus only  Try to cut down/ quit smoking as soon as possible     12/29/2021  f/u ov/[place] office/Venisha Boehning re: ? MAI  maint off trelegy x months  Chief Complaint  Patient presents with   Follow-up    CT scan done and nodule noted on lung  Has mucus in the bottom of her throat   Dyspnea:  Not limited by breathing from desired activities   Cough: now clear mucus/ sensation is worse p EGD with dilation despite ppi  Sleeping: still in recliner x 45 degrees  SABA use: none  02: none  Covid status: never vaxed  Lung cancer screening: rec referred but does not qualify due to wt loss    No obvious day to day or daytime variability or assoc excess/ purulent sputum or mucus plugs or hemoptysis or cp or chest tightness, subjective wheeze or overt sinus  symptoms.   Sleeping  without nocturnal  or early am exacerbation  of respiratory  c/o's or need for noct saba. Also denies any obvious fluctuation of symptoms with weather or environmental changes or other aggravating or alleviating factors except as outlined above   No unusual exposure hx or h/o childhood pna/ asthma or knowledge of premature birth.  Current Allergies, Complete Past Medical History, Past Surgical History, Family History, and Social History were reviewed in [employer] record.  ROS  The following are not active complaints unless bolded Hoarseness, sore throat, dysphagia/globus, dental problems, itching, sneezing,  nasal congestion or discharge of excess mucus or purulent secretions, ear ache,   fever, chills, sweats, unintended wt loss or wt gain, classically pleuritic or exertional cp,  orthopnea pnd or arm/hand swelling  or  leg swelling, presyncope, palpitations, abdominal pain, anorexia, nausea, vomiting, diarrhea  or change in bowel habits or change in bladder habits, change in stools or change in urine, dysuria, hematuria,  rash, arthralgias, visual complaints, headache, numbness, weakness or ataxia or problems with walking or coordination,  change in mood or  memory.        Current Meds  Medication Sig   Ascorbic Acid (VITAMIN C PO) Take 1 tablet by mouth daily.   aspirin EC 81 MG tablet Take 81 mg by mouth daily.    Multiple Vitamin (MULTIVITAMIN WITH MINERALS) TABS Take 1 tablet by mouth daily.   pantoprazole (PROTONIX) 40 MG tablet Take 1 tablet (40 mg total) by mouth daily. Take 30 minutes before breakfast                  Past Medical History:  Diagnosis Date   Carpal tunnel syndrome    COPD (chronic obstructive pulmonary disease) (HCC)         Objective:    wts   12/29/2021     102   02/21/20 113 lb 12.8 oz (51.6 kg)  12/26/19 113 lb (51.3 kg)  09/14/18 120 lb (54.4 kg)    Vital signs reviewed  12/29/2021  - Note at rest 02 sats  94% on ra   General appearance:    amb wf nad /smoker's rattle  HEENT : Oropharynx  clear       NECK :  without  apparent JVD/ palpable Nodes/TM    LUNGS: no acc muscle use,  Mild barrel  contour chest wall with bilateral  Distant bs s audible wheeze and  without cough on insp or exp maneuvers  and mild  Hyperresonant  to  percussion bilaterally     CV:  RRR  no s3 or murmur or increase in P2, and no edema   ABD:  soft and nontender with pos end  insp Hoover's  in the supine position.  No bruits or organomegaly appreciated   MS:  Nl gait/ ext warm without deformities Or obvious joint restrictions  calf tenderness, cyanosis or clubbing     SKIN: warm and dry without lesions    NEURO:  alert, approp, nl sensorium with  no motor or cerebellar deficits apparent.         Assessment

## 2021-12-29 NOTE — Patient Instructions (Addendum)
Only use your albuterol as a rescue medication to be used if you can't catch your breath by resting or doing a relaxed purse lip breathing pattern.  - The less you use it, the better it will work when you need it. - Ok to use up to 2 puffs  every 4 hours if you must but call for immediate appointment if use goes up over your usual need - Don't leave home without it !!  (think of it like the spare tire or starter fluid  for your car)   Also  Ok to try albuterol 15 min before an activity (on alternating days)  that you know would usually make you short of breath and see if it makes any difference and if makes none then don't take albuterol after activity unless you can't catch your breath as this means it's the resting that helps, not the albuterol.  Pantoprazole (protonix) 40 mg   Take  30-60 min before first meal of the day and Pepcid (famotidine)  20 mg after supper until return to office - this is the best way to tell whether stomach acid is contributing to your problem.    GERD (REFLUX)  is an extremely common cause of respiratory symptoms just like yours , many times with no obvious heartburn at all.    It can be treated with medication, but also with lifestyle changes including elevation of the head of your bed (ideally with 6 -8inch blocks under the headboard of your bed),  Smoking cessation, avoidance of late meals, excessive alcohol, and avoid fatty foods, chocolate, peppermint, colas, red wine, and acidic juices such as orange juice.  NO MINT OR MENTHOL PRODUCTS SO NO COUGH DROPS  USE SUGARLESS CANDY INSTEAD (Jolley ranchers or Stover's or Life Savers) or even ice chips will also do - the key is to swallow to prevent all throat clearing. NO OIL BASED VITAMINS - use powdered substitutes.  Avoid fish oil when coughing.       My office will be contacting you by phone for referral to lung cancer screening program and reschedule your PFTs   - if you don't hear back from my office within one  week please call us back or notify us thru MyChart and we'll address it right away.  Late add: does not qualify due to wt loss    Please schedule a follow up visit in 3 months but call sooner if needed

## 2021-12-30 ENCOUNTER — Encounter: Payer: Self-pay | Admitting: Internal Medicine

## 2021-12-30 ENCOUNTER — Telehealth: Payer: Self-pay | Admitting: Internal Medicine

## 2021-12-30 DIAGNOSIS — R634 Abnormal weight loss: Secondary | ICD-10-CM

## 2021-12-30 NOTE — Assessment & Plan Note (Signed)
Active smoker/MM  - 12/26/2019  Rec mucinex / flutter and continue trelegy  - alpha one phenotype 02/21/20  MM level 213    d/c'd trelegy ? Summer of 2023 and no worse at [facility] 12/29/2021 so left off and just rec saba prn with f/u pfts

## 2021-12-30 NOTE — Assessment & Plan Note (Signed)
Counseled re importance of smoking cessation but did not meet time criteria for separate billing           Each maintenance medication was reviewed in detail including emphasizing most importantly the difference between maintenance and prns and under what circumstances the prns are to be triggered using an action plan format where appropriate.  Total time for H and P, chart review, counseling, reviewing hfa/neb device(s) and generating customized AVS unique to this office visit / same day charting  > 30 min for muliple chronic progressive resp problems

## 2021-12-30 NOTE — Telephone Encounter (Signed)
I canceled the LDSCT and rec she have a regular CT chest s contrast due to the MAI concerns and wt loss noted on today's eval

## 2021-12-30 NOTE — Assessment & Plan Note (Addendum)
See cxr 12/26/2019 > minimal residual atx  - HRCT chest 01/22/20 1. There are innumerable new pulmonary nodules throughout the lungs,most concentrated in the upper lobes. The majority of these nodules are ground-glass in character, however several are solid and spiculated in morphology, the largest in the posterior left upper lobe measuring 1.0 x 0.9 cm and in the right upper lobe measuring 0.9 x 0.6 cm. Multiplicity and distribution are generally most suggestive of atypical infection, however morphology of the largest solid nodules is concerning for malignancy. Recommend initial follow-up CT in 3 months to assess for stability or resolution, and it may be further necessary to characterize the largest nodules for FDG avidity by PET-CT. Recommend multidisciplinary thoracic referral for further evaluation. 2. Small consolidations of the medial segment right middle lobe and lingula, similar to prior examination and consistent with sequelae of prior atypical infection, including atypical mycobacterium. 3. Diffuse bilateral bronchial wall thickening, consistent with nonspecific infectious or inflammatory bronchitis. 4. No significant bronchiectasis. 5. Emphysema. 6. Coronary artery disease. Aortic atherosclerosis.rec >> return for pfts, quant TB and Quant Ig's as well as Alpha one /  esr/eos and sputum for routine and afb if sample avail> ordered 02/21/2020  - Sputum 02/22/20  NO WBC SEEN FEW GRAM POSITIVE COCCI IN PAIRS RARE GRAM POSITIVE RODS > nl flora  - Quantiferon Gold TB  02/21/20  Neg  - Sputum  02/29/20  afb neg smear >>> neg culture  >>> assoc wt loss 12/29/2021 > repeat CT chest s contrast as does not qualify for LDSCT   >>> note persistent globus p egd likely is LPR and explained why EGD does not correct this so added pepcid 20 mg p summer and reviewed diet/ lifestyle recs  - see avs for instructions unique to this ov

## 2021-12-30 NOTE — Telephone Encounter (Signed)
ATC patient to notify. VM not set up. Will proceed with order for CT scan.

## 2022-01-14 ENCOUNTER — Ambulatory Visit: Payer: Medicare HMO | Admitting: Gastroenterology

## 2022-01-29 ENCOUNTER — Ambulatory Visit (HOSPITAL_COMMUNITY)
Admission: RE | Admit: 2022-01-29 | Discharge: 2022-01-29 | Disposition: A | Discharge status: Home / Self Care | Payer: Medicare HMO | Source: Ambulatory Visit | Attending: Internal Medicine | Admitting: Internal Medicine

## 2022-01-29 DIAGNOSIS — R918 Other nonspecific abnormal finding of lung field: Secondary | ICD-10-CM | POA: Diagnosis not present

## 2022-01-29 DIAGNOSIS — I2510 Atherosclerotic heart disease of native coronary artery without angina pectoris: Secondary | ICD-10-CM | POA: Diagnosis not present

## 2022-01-29 DIAGNOSIS — J439 Emphysema, unspecified: Secondary | ICD-10-CM | POA: Diagnosis not present

## 2022-01-29 DIAGNOSIS — R634 Abnormal weight loss: Secondary | ICD-10-CM | POA: Insufficient documentation

## 2022-01-29 DIAGNOSIS — I700 Atherosclerosis of aorta: Secondary | ICD-10-CM | POA: Diagnosis not present

## 2022-02-12 ENCOUNTER — Ambulatory Visit: Payer: Medicare HMO | Admitting: Gastroenterology

## 2022-03-10 ENCOUNTER — Ambulatory Visit (HOSPITAL_COMMUNITY)
Admission: RE | Admit: 2022-03-10 | Discharge: 2022-03-10 | Disposition: A | Discharge status: Home / Self Care | Payer: Medicare HMO | Source: Ambulatory Visit | Attending: Family Medicine | Admitting: Family Medicine

## 2022-03-10 ENCOUNTER — Other Ambulatory Visit (HOSPITAL_COMMUNITY): Payer: Self-pay | Admitting: Family Medicine

## 2022-03-10 DIAGNOSIS — M79604 Pain in right leg: Secondary | ICD-10-CM

## 2022-03-10 DIAGNOSIS — M25551 Pain in right hip: Secondary | ICD-10-CM | POA: Diagnosis not present

## 2022-03-10 DIAGNOSIS — M79661 Pain in right lower leg: Secondary | ICD-10-CM | POA: Diagnosis not present

## 2022-03-23 ENCOUNTER — Ambulatory Visit: Payer: Medicare HMO | Admitting: Internal Medicine

## 2022-03-23 NOTE — Progress Notes (Deleted)
Karen Church, female    DOB: July 24, 1950    MRN: [id number]   Brief patient profile:  72yowf active smoker with pattern of acute flares of cough with purulent sptum and pleuritic pain usually same area most noted in back, sometimes in front with most recent cxr's form 11/21/19 showing persistent RML atx/ nodular changes R base so referred to pulmonary clinic in [place]  12/26/2019 by Pablo Lawrence      History of Present Illness  12/26/2019  Pulmonary/ 1st office eval/ Eola Waldrep / [place] [facility]  Chief Complaint  Patient presents with   Pulmonary Consult    Referred by Pablo Lawrence, NP for eval of abnormal cxr. Pt states has had PNA multiple times since 2008.  She had PNA last in Oct 2021- has had some weakness since then but overall her breathing is doing well. She has prod cough with clear sputum.  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 Continue trelegy daily  Nebulizer (machine) can be used up to every 4 hours if needed  Please schedule a follow up visit in 3 months but call   with PFTs on return   I very strongly recommend you get the moderna or pfizer vaccine      02/21/2020  f/u ov/[place] office/Keyandra Swenson re:  ?MAI   / maint trelegy 100   Chief Complaint  Patient presents with   Follow-up    Patient has a productive cough with white foamy sputum. Denies shortness of breath.   Dyspnea: no change activity tol  Cough:  white/ foamy worse in am  Sleeping: mostly in recliner about 30 degrees SABA use: rarely neb 02: none  Rec We need to be sure   your pfts are done  prior to your next appt > not done as of 12/29/2021  We will give you a specimen cup to return to the lab first thing in am - nasty mucus only  Try to cut down/ quit smoking as soon as possible     12/29/2021  f/u ov/[place] office/Taariq Leitz re: ? MAI  maint off trelegy x months  Chief Complaint  Patient presents with   Follow-up    CT scan done and nodule noted on lung  Has mucus in the bottom of her throat   Dyspnea:  Not limited by breathing from desired activities   Cough: now clear mucus/ sensation is worse p EGD with dilation despite ppi  Sleeping: still in recliner x 45 degrees  SABA use: none  02: none  Covid status: never vaxed  Lung cancer screening: rec referred but does not qualify due to wt loss  Rec Only use your albuterol as a rescue medication   Also  Ok to try albuterol 15 min before an activity (on alternating days)  that you know would usually make you short of breath  Pantoprazole (protonix) 40 mg   Take  30-60 min before  first meal of the day and Pepcid (famotidine)  20 mg after supper until return to office GERD diet reviewed, bed blocks rec  My office will be contacting you by phone for referral to lung cancer screening program and reschedule your PFTs   Late add: does not qualify due to wt loss      03/23/2022  f/u ov/[place] office/Nuri Larmer re: *** maint on ***  No chief complaint on file.   Dyspnea:  *** Cough: *** Sleeping: *** SABA use: *** 02: *** Covid status: *** Lung cancer screening: ***   No obvious day to day or daytime variability or assoc excess/ purulent sputum or mucus plugs or hemoptysis or cp or chest tightness, subjective wheeze or overt sinus or hb symptoms.   *** without nocturnal  or early am exacerbation  of respiratory  c/o's or need for noct saba. Also denies any obvious  fluctuation of symptoms with weather or environmental changes or other aggravating or alleviating factors except as outlined above   No unusual exposure hx or h/o childhood pna/ asthma or knowledge of premature birth.  Current Allergies, Complete Past Medical History, Past Surgical History, Family History, and Social History were reviewed in [employer] record.  ROS  The following are not active complaints unless bolded Hoarseness, sore throat, dysphagia, dental problems, itching, sneezing,  nasal congestion or discharge of excess mucus or purulent secretions, ear ache,   fever, chills, sweats, unintended wt loss or wt gain, classically pleuritic or exertional cp,  orthopnea pnd or arm/hand swelling  or leg swelling, presyncope, palpitations, abdominal pain, anorexia, nausea, vomiting, diarrhea  or change in bowel habits or change in bladder habits, change in stools or change in urine, dysuria, hematuria,  rash, arthralgias, visual complaints, headache, numbness, weakness or ataxia or problems with walking or coordination,  change in mood or  memory.        No outpatient medications have been marked as taking for the 03/23/22 encounter (Appointment) with Tanda Rockers, MD.          Past Medical History:  Diagnosis Date   Carpal tunnel syndrome    COPD (chronic obstructive pulmonary disease) ([place])         Objective:    wts   03/23/2022       ***  12/29/2021     102   02/21/20 113 lb 12.8 oz (51.6 kg)  12/26/19 113 lb (51.3 kg)  09/14/18 120 lb (54.4 kg)    Vital signs reviewed  03/23/2022  - Note at rest 02 sats  ***% on ***   General appearance:    ***    Mild bar ***  .         Assessment

## 2022-04-02 DIAGNOSIS — E782 Mixed hyperlipidemia: Secondary | ICD-10-CM | POA: Diagnosis not present

## 2022-04-08 DIAGNOSIS — F17218 Nicotine dependence, cigarettes, with other nicotine-induced disorders: Secondary | ICD-10-CM | POA: Diagnosis not present

## 2022-04-08 DIAGNOSIS — M25551 Pain in right hip: Secondary | ICD-10-CM | POA: Diagnosis not present

## 2022-04-08 DIAGNOSIS — I10 Essential (primary) hypertension: Secondary | ICD-10-CM | POA: Diagnosis not present

## 2022-04-08 DIAGNOSIS — Z0001 Encounter for general adult medical examination with abnormal findings: Secondary | ICD-10-CM | POA: Diagnosis not present

## 2022-04-08 DIAGNOSIS — R0989 Other specified symptoms and signs involving the circulatory and respiratory systems: Secondary | ICD-10-CM | POA: Diagnosis not present

## 2022-04-08 DIAGNOSIS — E782 Mixed hyperlipidemia: Secondary | ICD-10-CM | POA: Diagnosis not present

## 2022-04-08 DIAGNOSIS — R69 Illness, unspecified: Secondary | ICD-10-CM | POA: Diagnosis not present

## 2022-04-08 DIAGNOSIS — R944 Abnormal results of kidney function studies: Secondary | ICD-10-CM | POA: Diagnosis not present

## 2022-04-08 DIAGNOSIS — R1310 Dysphagia, unspecified: Secondary | ICD-10-CM | POA: Diagnosis not present

## 2022-04-08 DIAGNOSIS — J449 Chronic obstructive pulmonary disease, unspecified: Secondary | ICD-10-CM | POA: Diagnosis not present

## 2022-04-08 DIAGNOSIS — F17210 Nicotine dependence, cigarettes, uncomplicated: Secondary | ICD-10-CM | POA: Diagnosis not present

## 2022-04-23 NOTE — Progress Notes (Unsigned)
Karen Church, female    DOB: 05/27/1950    MRN: [id number]   Brief patient profile:  34 yowf active smoker with pattern of acute flares of cough with purulent sptum and pleuritic pain usually same area most noted in back, sometimes in front with most recent cxr's form 11/21/19 showing persistent RML atx/ nodular changes R base so referred to pulmonary clinic in [place]  12/26/2019 by Pablo Lawrence      History of Present Illness  12/26/2019  Pulmonary/ 1st office eval/ Karen Church / [place] [facility]  Chief Complaint  Patient presents with   Pulmonary Consult    Referred by Pablo Lawrence, NP for eval of abnormal cxr. Pt states has had PNA multiple times since 2008.  She had PNA last in Oct 2021- has had some weakness since then but overall her breathing is doing well. She has prod cough with clear sputum.  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 Continue trelegy daily  Nebulizer (machine) can be used up to every 4 hours if needed  Please schedule a follow up visit in 3 months but call   with PFTs on return   I very strongly recommend you get the moderna or pfizer vaccine      02/21/2020  f/u ov/[place] office/Karen Church re:  ?MAI   / maint trelegy 100   Chief Complaint  Patient presents with   Follow-up    Patient has a productive cough with white foamy sputum. Denies shortness of breath.   Dyspnea: no change activity tol  Cough:  white/ foamy worse in am  Sleeping: mostly in recliner about 30 degrees SABA use: rarely neb 02: none  Rec We need to be sure   your pfts are done  prior to your next appt > not done as of 12/29/2021  We will give you a specimen cup to return to the lab first thing in am - nasty mucus only  Try to cut down/ quit smoking as soon as possible     12/29/2021  f/u ov/[place] office/Karen Church re: ? MAI  maint off trelegy x months  Chief Complaint  Patient presents with   Follow-up    CT scan done and nodule noted on lung  Has mucus in the bottom of her throat   Dyspnea:  Not limited by breathing from desired activities   Cough: now clear mucus/ sensation is worse p EGD with dilation despite ppi  Sleeping: still in recliner x 45 degrees  SABA use: none  02: none  Covid status: never vaxed  Lung cancer screening: rec referred but does not qualify due to wt loss  Rec Only use your albuterol as a rescue medication  Also  Ok to try albuterol 15 min before an activity  Pantoprazole (protonix) 40 mg   Take  30-60 min before first meal of the day and Pepcid (famotidine)  20 mg after supper until  return to office GERD diet My office will be contacting you by phone for referral to lung cancer screening program and reschedule your PFTs   - if you don't hear back from my office within one week please call us back or notify us thru MyChart and we'll address it right away.  Late add: does not qualify due to wt loss      04/24/2022  f/u ov/[place] office/Karen Church re: copd  maint on prn saba   Chief Complaint  Patient presents with   Follow-up    Pt f/u for COPD states that she is doing well and breathing is baseline  Dyspnea:  Not limited by breathing from desired activities  / still doing landscaping  Cough: am congestion white / also worse breathing x first hours  Sleeping: 45 degrees recliner  SABA use:  saba few times a month  /  has neb very rarely  02: none       No obvious day to day or  daytime variability or assoc excess/ purulent sputum or mucus plugs or hemoptysis or cp or chest tightness, subjective wheeze or overt sinus or hb symptoms.   *** without nocturnal  or early am exacerbation  of respiratory  c/o's or need for noct saba. Also denies any obvious fluctuation of symptoms with weather or environmental changes or other aggravating or alleviating factors except as outlined above   No unusual exposure hx or h/o childhood pna/ asthma or knowledge of premature birth.  Current Allergies, Complete Past Medical History, Past Surgical History, Family History, and Social History were reviewed in [employer] record.  ROS  The following are not active complaints unless bolded Hoarseness, sore throat, dysphagia, dental problems, itching, sneezing,  nasal congestion or discharge of excess mucus or purulent secretions, ear ache,   fever, chills, sweats, unintended wt loss or wt gain, classically pleuritic or exertional cp,  orthopnea pnd or arm/hand swelling  or leg swelling, presyncope, palpitations, abdominal pain, anorexia, nausea, vomiting, diarrhea  or change in bowel habits or change in bladder habits, change in stools or change in urine, dysuria, hematuria,  rash, arthralgias, visual complaints, headache, numbness, weakness or ataxia or problems with walking or coordination,  change in mood or  memory.        Current Meds  Medication Sig   albuterol (VENTOLIN HFA) 108 (90 Base) MCG/ACT inhaler Inhale 2 puffs into the lungs every 4 (four) hours as needed for wheezing or shortness of breath.   Ascorbic Acid (VITAMIN C PO) Take 1 tablet by mouth daily.   aspirin EC 81 MG tablet Take 81 mg by mouth daily.    Multiple Vitamin (MULTIVITAMIN WITH MINERALS) TABS Take 1 tablet by mouth daily.   Omega 3 1000 MG CAPS Take 1,000 mg by mouth daily.   pantoprazole (PROTONIX) 40 MG tablet Take 1 tablet (40 mg total) by mouth daily. Take 30 minutes before breakfast            Past Medical History:  Diagnosis Date   Carpal tunnel syndrome    COPD (chronic obstructive pulmonary disease) (HCC)         Objective:    wts  04/24/2022       106  12/29/2021     102   02/21/20 113 lb 12.8 oz (51.6 kg)  12/26/19 113 lb (51.3 kg)  09/14/18 120 lb (54.4 kg)    Vital signs reviewed  04/24/2022  - Note at rest 02 sats  92%  on RA   General appearance:    thin amb wf nad      5cmod  I personally reviewed images and agree with radiology impression as follows:   Chest CT 01/29/22  w/o contrast    mod emphysema no suspicious  nodules     Assessment

## 2022-04-24 ENCOUNTER — Ambulatory Visit: Payer: Medicare HMO | Admitting: Internal Medicine

## 2022-04-24 ENCOUNTER — Encounter: Payer: Self-pay | Admitting: Internal Medicine

## 2022-04-24 VITALS — BP 144/77 | Ht 62.0 in | Wt 106.4 lb

## 2022-04-24 DIAGNOSIS — F17210 Nicotine dependence, cigarettes, uncomplicated: Secondary | ICD-10-CM | POA: Diagnosis not present

## 2022-04-24 DIAGNOSIS — R69 Illness, unspecified: Secondary | ICD-10-CM | POA: Diagnosis not present

## 2022-04-24 DIAGNOSIS — J449 Chronic obstructive pulmonary disease, unspecified: Secondary | ICD-10-CM | POA: Diagnosis not present

## 2022-04-24 NOTE — Patient Instructions (Addendum)
My office will be contacting you by phone for referral to PFTs  - if you don't hear back from my office within one week please call us back or notify us thru MyChart and we'll address it right away.    Ok to try albuterol 15 min before an activity (on alternating days)  that you know would usually make you short of breath and see if it makes any difference and if makes none then don't take albuterol after activity unless you can't catch your breath as this means it's the resting that helps, not the albuterol.    Please schedule a follow up visit in 3 months but call sooner if needed

## 2022-04-25 ENCOUNTER — Telehealth: Payer: Self-pay | Admitting: Internal Medicine

## 2022-04-25 ENCOUNTER — Encounter: Payer: Self-pay | Admitting: Internal Medicine

## 2022-04-25 DIAGNOSIS — J449 Chronic obstructive pulmonary disease, unspecified: Secondary | ICD-10-CM

## 2022-04-25 NOTE — Assessment & Plan Note (Signed)

## 2022-04-25 NOTE — Telephone Encounter (Signed)
Needs pfts scheduled at [facility] next available

## 2022-04-25 NOTE — Assessment & Plan Note (Addendum)
Active smoker/MM  - 12/26/2019  Rec mucinex / flutter and continue trelegy  - alpha one phenotype 02/21/20  MM level 213  - d/c'd trelegy ? Summer of 2023 and no worse at [facility] 12/29/2021 so left off and just rec saba prn with f/u pfts  Suspect she has at least mod copd with suprisingly few symptoms at this point so rec continue saba prn and proceed with pfts   Re SABA :  I spent extra time with pt today reviewing appropriate use of albuterol for prn use on exertion with the following points: 1) saba is for relief of sob that does not improve by walking a slower pace or resting but rather if the pt does not improve after trying this first. 2) If the pt is convinced, as many are, that saba helps recover from activity faster then it's easy to tell if this is the case by re-challenging : ie stop, take the inhaler, then p 5 minutes try the exact same activity (intensity of workload) that just caused the symptoms and see if they are substantially diminished or not after saba 3) if there is an activity that reproducibly causes the symptoms, try the saba 15 min before the activity on alternate days   If in fact the saba really does help, then fine to continue to use it prn but advised may need to look closer at the maintenance regimen being used to achieve better control of airways disease with exertion.          Each maintenance medication was reviewed in detail including emphasizing most importantly the difference between maintenance and prns and under what circumstances the prns are to be triggered using an action plan format where appropriate.  Total time for H and P, chart review, counseling, reviewing hfa  device(s) and generating customized AVS unique to this office visit / same day charting = 25 min

## 2022-04-27 NOTE — Telephone Encounter (Signed)
Pft order placed for [facility]

## 2022-05-18 ENCOUNTER — Ambulatory Visit
Admission: EM | Admit: 2022-05-18 | Discharge: 2022-05-18 | Disposition: A | Discharge status: Home / Self Care | Payer: Medicare HMO | Attending: Nurse Practitioner | Admitting: Nurse Practitioner

## 2022-05-18 ENCOUNTER — Ambulatory Visit (INDEPENDENT_AMBULATORY_CARE_PROVIDER_SITE_OTHER): Payer: Medicare HMO

## 2022-05-18 DIAGNOSIS — R059 Cough, unspecified: Secondary | ICD-10-CM

## 2022-05-18 DIAGNOSIS — J189 Pneumonia, unspecified organism: Secondary | ICD-10-CM | POA: Diagnosis not present

## 2022-05-18 MED ORDER — IPRATROPIUM-ALBUTEROL 0.5-2.5 (3) MG/3ML IN SOLN
3.0000 mL | Freq: Once | RESPIRATORY_TRACT | Status: AC
  Administered 2022-05-18: 3 mL via RESPIRATORY_TRACT

## 2022-05-18 MED ORDER — CEFTRIAXONE SODIUM 1 G IJ SOLR
1.0000 g | Freq: Once | INTRAMUSCULAR | Status: AC
  Administered 2022-05-18: 1 g via INTRAMUSCULAR

## 2022-05-18 MED ORDER — AZITHROMYCIN 250 MG PO TABS: 250.0000 mg | ORAL_TABLET | Freq: Every day | ORAL | 0 refills | Status: DC

## 2022-05-18 MED ORDER — PREDNISONE 20 MG PO TABS: 40.0000 mg | ORAL_TABLET | Freq: Every day | ORAL | 0 refills | Status: AC

## 2022-05-18 MED ORDER — AMOXICILLIN-POT CLAVULANATE 875-125 MG PO TABS: 1.0000 | ORAL_TABLET | Freq: Two times a day (BID) | ORAL | 0 refills | Status: AC

## 2022-05-18 MED ORDER — METHYLPREDNISOLONE SODIUM SUCC 125 MG IJ SOLR
60.0000 mg | Freq: Once | INTRAMUSCULAR | Status: AC
  Administered 2022-05-18: 125 mg via INTRAMUSCULAR

## 2022-05-18 NOTE — ED Triage Notes (Signed)
Pt c/o of fatigue difficulty breathing at home o2 sensor reading between 85%-92% x 2 weeks    Pt states that her levels drop when she is mobile.

## 2022-05-18 NOTE — ED Provider Notes (Signed)
[facility]    CSN: [id number] Arrival date & time: 05/18/22  1629      History   Chief Complaint Chief Complaint  Patient presents with   Weakness    HPI Karen Church is a 72 y.o. female.   Patient presents today for 2-week history of congested cough, shortness of breath, chest pain when she coughs and when she breathes in her right lower back, chest tightness, runny and stuffy nose, postnasal drainage, decreased appetite, and fatigue.  Reports for the last week, she has felt "feverish" with bodyaches and chills.  No known fevers at home.  Has been using rescue "puffer" which seems to help with symptoms minimally for short time.  Also has an albuterol nebulizer at home that she has been using, last use was earlier this morning.  Patient reports she has a pulmonologist and is currently being worked up for COPD.  She has not been formally diagnosed with COPD.  She does have a smoking history.  Reports she has been measuring her oxygen levels at home and they have been ranging between 85% to 92% on room air.  She does not wear home oxygen.    Past Medical History:  Diagnosis Date   Carpal tunnel syndrome    COPD (chronic obstructive pulmonary disease)     Patient Active Problem List   Diagnosis Date Noted   Globus sensation 12/02/2021   Abdominal pain 07/02/2021   Melena 07/02/2021   Atypical TB suggested on HRCT 01/22/20  12/27/2019   COPD GOLD ?  12/26/2019   Cigarette smoker 12/26/2019   H. pylori infection 03/16/2016   Loss of weight 11/20/2015   Dysphagia 11/20/2015   Fatigue 11/20/2015   Encounter for screening colonoscopy 11/20/2015    Past Surgical History:  Procedure Laterality Date   ABDOMINAL HYSTERECTOMY     BALLOON DILATION N/A 08/11/2021   Procedure: BALLOON DILATION;  Surgeon: Lanelle Bal, DO;  [facility];  Service: Endoscopy;  Laterality: N/A;   BIOPSY  12/10/2015   Procedure: BIOPSY;  Surgeon: Corbin Ade, MD;   Location: [facility];  Service: Endoscopy;;  Gastric    BIOPSY  08/11/2021   Procedure: BIOPSY;  Surgeon: Lanelle Bal, DO;  [facility]: [facility];  Service: Endoscopy;;  gastric    CESAREAN SECTION     COLONOSCOPY N/A 12/10/2015   normal   ESOPHAGOGASTRODUODENOSCOPY N/A 12/10/2015   normal esophagus s/p dilation, +H.pylori   ESOPHAGOGASTRODUODENOSCOPY (EGD) WITH PROPOFOL N/A 08/11/2021   Procedure: ESOPHAGOGASTRODUODENOSCOPY (EGD) WITH PROPOFOL;  Surgeon: Lanelle Bal, DO;  Location: [facility];  Service: Endoscopy;  Laterality: N/A;  2:15pm   MALONEY DILATION N/A 12/10/2015   Procedure: [name] DILATION;  Surgeon: Corbin Ade, MD;  Location: [facility];  Service: Endoscopy;  Laterality: N/A;   SKIN CANCER EXCISION     SMALL INTESTINE SURGERY N/A 1967   Resection    OB History   No obstetric history on file.      Home Medications    Prior to Admission medications   Medication Sig Start Date End Date Taking? Authorizing Provider  amoxicillin-clavulanate (AUGMENTIN) 875-125 MG tablet Take 1 tablet by mouth 2 (two) times daily for 7 days. 05/18/22 05/25/22 Yes Valentino Nose, NP  azithromycin (ZITHROMAX) 250 MG tablet Take 1 tablet (250 mg total) by mouth daily. Take first 2 tablets together, then 1 every day until finished. 05/18/22  Yes Valentino Nose, NP  predniSONE (DELTASONE)  20 MG tablet Take 2 tablets (40 mg total) by mouth daily with breakfast for 5 days. 05/18/22 05/23/22 Yes Valentino NoseMartinez, Keven Osborn A, NP  albuterol (VENTOLIN HFA) 108 (90 Base) MCG/ACT inhaler Inhale 2 puffs into the lungs every 4 (four) hours as needed for wheezing or shortness of breath. 12/29/21   Nyoka CowdenWert, Michael B, MD  Ascorbic Acid (VITAMIN C PO) Take 1 tablet by mouth daily.    [provider]  aspirin EC 81 MG tablet Take 81 mg by mouth daily.     [provider]  Multiple Vitamin (MULTIVITAMIN WITH MINERALS) TABS Take 1 tablet by mouth daily.    [provider]  Omega 3 1000 MG CAPS Take 1,000 mg by mouth daily.    [provider]  pantoprazole (PROTONIX) 40 MG tablet Take 1 tablet (40 mg total) by mouth daily. Take 30 minutes before breakfast 12/02/21   Gelene MinkBoone, Anna W, NP    Family History Family History  Problem Relation Age of Onset   Colon polyps Sister    Colon polyps Brother    Colon cancer Neg Hx     Social History Social History   Tobacco Use   Smoking status: Every Day    Packs/day: 0.50    Years: 50.00    Additional pack years: 0.00    Total pack years: 25.00    Types: Cigarettes    Passive exposure: Current   Smokeless tobacco: Never   Tobacco comments:    currently at 1/2 ppd 12/26/19//lmr  Vaping Use   Vaping Use: Never used  Substance Use Topics   Alcohol use: No   Drug use: No     Allergies   Lasix [furosemide]   Review of Systems Review of Systems Per HPI  Physical Exam Triage Vital Signs ED Triage Vitals  Enc Vitals Group     BP 05/18/22 1646 (!) 145/67     Pulse Rate 05/18/22 1646 99     Resp 05/18/22 1646 19     Temp 05/18/22 1646 99.3 F (37.4 C)     Temp src --      SpO2 05/18/22 1646 (!) 86 %     Weight --      Height --      Head Circumference --      Peak Flow --      Pain Score 05/18/22 1650 0     Pain Loc --      Pain Edu? --      Excl. in GC? --    No data found.  Updated Vital Signs BP (!) 145/67 (BP Location: Right Arm)   Pulse 99   Temp 99.3 F (37.4 C)   Resp 19   SpO2 90%   Visual Acuity Right Eye Distance:   Left Eye Distance:   Bilateral Distance:    Right Eye Near:   Left Eye Near:    Bilateral Near:     Physical Exam Vitals and nursing note reviewed.  Constitutional:      General: She is not in acute distress.    Appearance: Normal appearance. She is not ill-appearing or toxic-appearing.  HENT:     Head: Normocephalic and atraumatic.     Right Ear: Tympanic membrane, ear canal and external ear normal.     Left Ear: Tympanic membrane, ear  canal and external ear normal.     Nose: No congestion or rhinorrhea.     Mouth/Throat:     Mouth: Mucous membranes are  moist.     Pharynx: Oropharynx is clear. Posterior oropharyngeal erythema present. No oropharyngeal exudate.  Eyes:     General: No scleral icterus.    Extraocular Movements: Extraocular movements intact.  Cardiovascular:     Rate and Rhythm: Normal rate and regular rhythm.  Pulmonary:     Effort: Pulmonary effort is normal. Tachypnea present. No accessory muscle usage or respiratory distress.     Breath sounds: Decreased air movement present. Examination of the right-middle field reveals rhonchi. Examination of the right-lower field reveals rhonchi. Rhonchi present. No wheezing.  Abdominal:     General: Abdomen is flat. Bowel sounds are normal. There is no distension.     Palpations: Abdomen is soft.  Musculoskeletal:     Cervical back: Normal range of motion and neck supple.  Lymphadenopathy:     Cervical: No cervical adenopathy.  Skin:    General: Skin is warm and dry.     Coloration: Skin is not jaundiced or pale.     Findings: No erythema or rash.  Neurological:     Mental Status: She is alert and oriented to person, place, and time.  Psychiatric:        Behavior: Behavior is cooperative.      UC Treatments / Results  Labs (all labs ordered are listed, but only abnormal results are displayed) Labs Reviewed - No data to display  EKG   Radiology DG Chest 2 View  Result Date: 05/18/2022 CLINICAL DATA:  Cough EXAM: CHEST - 2 VIEW COMPARISON:  Chest x-ray 10/29/2021 FINDINGS: There is right middle lobe airspace consolidation compatible with pneumonia. There is no pleural effusion or pneumothorax. Cardiomediastinal silhouette is within normal limits. No acute fractures. IMPRESSION: Right middle lobe pneumonia. Follow-up PA and lateral chest x-ray recommended in 4-6 weeks to confirm resolution. Electronically Signed   By: Darliss Cheney M.D.   On: 05/18/2022  18:08    Procedures Procedures (including critical care time)  Medications Ordered in UC Medications  ipratropium-albuterol (DUONEB) 0.5-2.5 (3) MG/3ML nebulizer solution 3 mL (3 mLs Nebulization Given 05/18/22 1709)  methylPREDNISolone sodium succinate (SOLU-MEDROL) 125 mg/2 mL injection 60 mg (125 mg Intramuscular Given 05/18/22 1705)  cefTRIAXone (ROCEPHIN) injection 1 g (1 g Intramuscular Given 05/18/22 1823)    Initial Impression / Assessment and Plan / UC Course  I have reviewed the triage vital signs and the nursing notes.  Pertinent labs & imaging results that were available during my care of the patient were reviewed by me and considered in my medical decision making (see chart for details).   Patient is frail-appearing, normotensive, afebrile, not tachycardic.  She is mildly tachypneic and initially hypoxic into the mid 80s on room air after walking from the waiting room to triage.  1. Pneumonia of right middle lobe due to infectious organism After breathing treatment and Solu-Medrol IM 60 mg in urgent care today, oxygenation increased to 96% on 2 L nasal cannula Nasal cannula removed and oxygenation stayed in the low 90s on room air Chest x-ray today shows pneumonia of right middle lobe After walking to chest x-ray and back, oxygenation decreased into the mid to high 80s on room air Recommended further evaluation and management emergency room, however patient is adamantly against going to emergency room at this time Discussed with patient that given scenario, I am concerned that she may have a life-threatening condition that may not be treatable on outpatient basis Patient continues to decline ER evaluation and states she understands the risks of being  treated as an outpatient Will treat with azithromycin and Augmentin given significant comorbidities Start oral prednisone tomorrow Continue breathing treatments every 4-6 hours as needed for wheezing or shortness of breath Pulmonary  hygiene discussed Recommended follow-up here, with pulmonologist, or primary care provider within 48 hours for reevaluation, sooner in the emergency room if symptoms worsen or do not improve with prescribed home medication  The patient was given the opportunity to ask questions.  All questions answered to their satisfaction.  The patient is in agreement to this plan.    Final Clinical Impressions(s) / UC Diagnoses   Final diagnoses:  Pneumonia of right middle lobe due to infectious organism     Discharge Instructions      The chest x-ray today shows pneumonia in your right middle lung.  Please take both the azithromycin and Augmentin as prescribed to treat it.  We have given you a shot of antibiotic today.  Continue breathing treatments at home every 4-6 hours as needed for wheezing or shortness of breath.  Start oral prednisone tomorrow.  Please follow-up with [place] or primary care provider within the next 48 hours for lung recheck.  Please also follow-up with primary care provider in 4 to 6 weeks for reevaluation of lungs via x-ray to ensure full resolution of pneumonia.  If your symptoms worsen in the meantime or your oxygen level continues to drop, please go to the emergency room.     ED Prescriptions     Medication Sig Dispense Auth. Provider   azithromycin (ZITHROMAX) 250 MG tablet Take 1 tablet (250 mg total) by mouth daily. Take first 2 tablets together, then 1 every day until finished. 6 tablet Cathlean Marseilles A, NP   amoxicillin-clavulanate (AUGMENTIN) 875-125 MG tablet Take 1 tablet by mouth 2 (two) times daily for 7 days. 14 tablet Cathlean Marseilles A, NP   predniSONE (DELTASONE) 20 MG tablet Take 2 tablets (40 mg total) by mouth daily with breakfast for 5 days. 10 tablet Valentino Nose, NP      PDMP not reviewed this encounter.   Valentino Nose, NP 05/18/22 [phone]

## 2022-05-18 NOTE — Discharge Instructions (Addendum)
The chest x-ray today shows pneumonia in your right middle lung.  Please take both the azithromycin and Augmentin as prescribed to treat it.  We have given you a shot of antibiotic today.  Continue breathing treatments at home every 4-6 hours as needed for wheezing or shortness of breath.  Start oral prednisone tomorrow.  Please follow-up with [place] or primary care provider within the next 48 hours for lung recheck.  Please also follow-up with primary care provider in 4 to 6 weeks for reevaluation of lungs via x-ray to ensure full resolution of pneumonia.  If your symptoms worsen in the meantime or your oxygen level continues to drop, please go to the emergency room.

## 2022-08-11 ENCOUNTER — Ambulatory Visit (HOSPITAL_COMMUNITY): Payer: Medicare HMO

## 2022-09-28 DIAGNOSIS — I10 Essential (primary) hypertension: Secondary | ICD-10-CM | POA: Diagnosis not present

## 2022-09-28 DIAGNOSIS — E782 Mixed hyperlipidemia: Secondary | ICD-10-CM | POA: Diagnosis not present

## 2022-09-29 ENCOUNTER — Ambulatory Visit (HOSPITAL_COMMUNITY)
Admission: RE | Admit: 2022-09-29 | Discharge: 2022-09-29 | Disposition: A | Discharge status: Home / Self Care | Payer: Medicare HMO | Source: Ambulatory Visit | Attending: Internal Medicine | Admitting: Internal Medicine

## 2022-09-29 DIAGNOSIS — J449 Chronic obstructive pulmonary disease, unspecified: Secondary | ICD-10-CM | POA: Insufficient documentation

## 2022-09-29 LAB — PULMONARY FUNCTION TEST
DL/VA % pred: 73 %
DL/VA: 3.12 ml/min/mmHg/L
DLCO unc % pred: 55 %
DLCO unc: 9.53 ml/min/mmHg
FEF 25-75 Post: 0.37 L/s
FEF 25-75 Pre: 0.28 L/s
FEF2575-%Change-Post: 30 %
FEF2575-%Pred-Post: 22 %
FEF2575-%Pred-Pre: 17 %
FEV1-%Change-Post: 12 %
FEV1-%Pred-Post: 47 %
FEV1-%Pred-Pre: 42 %
FEV1-Post: 0.92 L
FEV1-Pre: 0.82 L
FEV1FVC-%Change-Post: 17 %
FEV1FVC-%Pred-Pre: 61 %
FEV6-%Change-Post: 4 %
FEV6-%Pred-Post: 67 %
FEV6-%Pred-Pre: 64 %
FEV6-Post: 1.66 L
FEV6-Pre: 1.58 L
FEV6FVC-%Change-Post: 9 %
FEV6FVC-%Pred-Post: 102 %
FEV6FVC-%Pred-Pre: 93 %
FVC-%Change-Post: -4 %
FVC-%Pred-Post: 66 %
FVC-%Pred-Pre: 69 %
FVC-Post: 1.7 L
FVC-Pre: 1.78 L
Post FEV1/FVC ratio: 54 %
Post FEV6/FVC ratio: 97 %
Pre FEV1/FVC ratio: 46 %
Pre FEV6/FVC Ratio: 89 %
RV % pred: 505 %
RV: 10.51 L
TLC % pred: 264 %
TLC: 12.19 L

## 2022-09-29 MED ORDER — ALBUTEROL SULFATE (2.5 MG/3ML) 0.083% IN NEBU
2.5000 mg | INHALATION_SOLUTION | Freq: Once | RESPIRATORY_TRACT | Status: AC
  Administered 2022-09-29: 2.5 mg via RESPIRATORY_TRACT

## 2022-10-05 ENCOUNTER — Other Ambulatory Visit (HOSPITAL_COMMUNITY): Payer: Self-pay | Admitting: Family Medicine

## 2022-10-05 DIAGNOSIS — R3129 Other microscopic hematuria: Secondary | ICD-10-CM | POA: Diagnosis not present

## 2022-10-05 DIAGNOSIS — R944 Abnormal results of kidney function studies: Secondary | ICD-10-CM | POA: Diagnosis not present

## 2022-10-05 DIAGNOSIS — F17210 Nicotine dependence, cigarettes, uncomplicated: Secondary | ICD-10-CM | POA: Diagnosis not present

## 2022-10-05 DIAGNOSIS — D72829 Elevated white blood cell count, unspecified: Secondary | ICD-10-CM | POA: Diagnosis not present

## 2022-10-05 DIAGNOSIS — J449 Chronic obstructive pulmonary disease, unspecified: Secondary | ICD-10-CM | POA: Diagnosis not present

## 2022-10-05 DIAGNOSIS — R7303 Prediabetes: Secondary | ICD-10-CM | POA: Diagnosis not present

## 2022-10-05 DIAGNOSIS — R634 Abnormal weight loss: Secondary | ICD-10-CM

## 2022-10-05 DIAGNOSIS — I10 Essential (primary) hypertension: Secondary | ICD-10-CM | POA: Diagnosis not present

## 2022-10-05 DIAGNOSIS — E782 Mixed hyperlipidemia: Secondary | ICD-10-CM | POA: Diagnosis not present

## 2022-10-05 DIAGNOSIS — R0989 Other specified symptoms and signs involving the circulatory and respiratory systems: Secondary | ICD-10-CM | POA: Diagnosis not present

## 2022-10-05 DIAGNOSIS — F17218 Nicotine dependence, cigarettes, with other nicotine-induced disorders: Secondary | ICD-10-CM | POA: Diagnosis not present

## 2022-10-05 DIAGNOSIS — R079 Chest pain, unspecified: Secondary | ICD-10-CM | POA: Diagnosis not present

## 2022-10-05 DIAGNOSIS — M25551 Pain in right hip: Secondary | ICD-10-CM | POA: Diagnosis not present

## 2022-10-19 ENCOUNTER — Ambulatory Visit (HOSPITAL_COMMUNITY)
Admission: RE | Admit: 2022-10-19 | Discharge: 2022-10-19 | Disposition: A | Discharge status: Home / Self Care | Payer: Medicare HMO | Source: Ambulatory Visit | Attending: Family Medicine | Admitting: Family Medicine

## 2022-10-19 DIAGNOSIS — R918 Other nonspecific abnormal finding of lung field: Secondary | ICD-10-CM | POA: Diagnosis not present

## 2022-10-19 DIAGNOSIS — N1330 Unspecified hydronephrosis: Secondary | ICD-10-CM | POA: Diagnosis not present

## 2022-10-19 DIAGNOSIS — R634 Abnormal weight loss: Secondary | ICD-10-CM | POA: Diagnosis not present

## 2022-10-19 DIAGNOSIS — I700 Atherosclerosis of aorta: Secondary | ICD-10-CM | POA: Diagnosis not present

## 2022-10-19 DIAGNOSIS — N281 Cyst of kidney, acquired: Secondary | ICD-10-CM | POA: Diagnosis not present

## 2022-10-19 MED ORDER — IOHEXOL 300 MG/ML  SOLN
80.0000 mL | Freq: Once | INTRAMUSCULAR | Status: AC | PRN
  Administered 2022-10-19: 80 mL via INTRAVENOUS

## 2022-11-01 ENCOUNTER — Ambulatory Visit: Payer: Medicare HMO

## 2022-11-01 ENCOUNTER — Ambulatory Visit
Admission: EM | Admit: 2022-11-01 | Discharge: 2022-11-01 | Disposition: A | Discharge status: Home / Self Care | Payer: Medicare HMO | Attending: Family Medicine | Admitting: Family Medicine

## 2022-11-01 DIAGNOSIS — R059 Cough, unspecified: Secondary | ICD-10-CM

## 2022-11-01 DIAGNOSIS — R0902 Hypoxemia: Secondary | ICD-10-CM

## 2022-11-01 DIAGNOSIS — R0602 Shortness of breath: Secondary | ICD-10-CM | POA: Diagnosis not present

## 2022-11-01 DIAGNOSIS — J441 Chronic obstructive pulmonary disease with (acute) exacerbation: Secondary | ICD-10-CM

## 2022-11-01 MED ORDER — PREDNISONE 20 MG PO TABS: 40.0000 mg | ORAL_TABLET | Freq: Every day | ORAL | 0 refills | Status: DC

## 2022-11-01 MED ORDER — AMOXICILLIN-POT CLAVULANATE 875-125 MG PO TABS: 1.0000 | ORAL_TABLET | Freq: Two times a day (BID) | ORAL | 0 refills | Status: DC

## 2022-11-01 MED ORDER — IPRATROPIUM-ALBUTEROL 0.5-2.5 (3) MG/3ML IN SOLN
3.0000 mL | Freq: Once | RESPIRATORY_TRACT | Status: AC
  Administered 2022-11-01: 3 mL via RESPIRATORY_TRACT

## 2022-11-01 NOTE — ED Notes (Signed)
After provider rechecked vitals pt o2 went to 87 %

## 2022-11-01 NOTE — ED Notes (Signed)
Patient O2 is 93 after duoneb treatment

## 2022-11-01 NOTE — ED Triage Notes (Signed)
Pt reports cough, congested, sob , x 3 days  States she has chest pain across her chest to her back   Doers yard work for a living .    Has not used inhaler or neb treatment.

## 2022-11-02 ENCOUNTER — Ambulatory Visit: Payer: Medicare HMO | Admitting: Internal Medicine

## 2022-11-02 NOTE — ED Provider Notes (Signed)
[facility]    CSN: [id number] Arrival date & time: 11/01/22  1449      History   Chief Complaint No chief complaint on file.   HPI Karen Church is a 72 y.o. female.   Presenting today with 3 day history of cough, CP, SOB, congestion. Denies fever, chills, body aches, N/V/D. Tried her breathing treatment once since onset of sxs, otherwise not trying anything OTC for sxs. Hx of severe COPD followed by Pumonology. Frequent hx of pneumonia. No known sick contacts.     Past Medical History:  Diagnosis Date   Carpal tunnel syndrome    COPD (chronic obstructive pulmonary disease) (HCC)     Patient Active Problem List   Diagnosis Date Noted   Globus sensation 12/02/2021   Abdominal pain 07/02/2021   Melena 07/02/2021   Atypical TB suggested on HRCT 01/22/20  12/27/2019   COPD GOLD 3 12/26/2019   Cigarette smoker 12/26/2019   H. pylori infection 03/16/2016   Loss of weight 11/20/2015   Dysphagia 11/20/2015   Fatigue 11/20/2015   Encounter for screening colonoscopy 11/20/2015    Past Surgical History:  Procedure Laterality Date   ABDOMINAL HYSTERECTOMY     BALLOON DILATION N/A 08/11/2021   Procedure: BALLOON DILATION;  Surgeon: Lanelle Bal, DO;  [facility]: [facility];  Service: Endoscopy;  Laterality: N/A;   BIOPSY  12/10/2015   Procedure: BIOPSY;  Surgeon: Corbin Ade, MD;  Location: [facility];  Service: Endoscopy;;  Gastric    BIOPSY  08/11/2021   Procedure: BIOPSY;  Surgeon: Lanelle Bal, DO;  [facility]: [facility];  Service: Endoscopy;;  gastric    CESAREAN SECTION     COLONOSCOPY N/A 12/10/2015   normal   ESOPHAGOGASTRODUODENOSCOPY N/A 12/10/2015   normal esophagus s/p dilation, +H.pylori   ESOPHAGOGASTRODUODENOSCOPY (EGD) WITH PROPOFOL N/A 08/11/2021   Procedure: ESOPHAGOGASTRODUODENOSCOPY (EGD) WITH PROPOFOL;  Surgeon: Lanelle Bal, DO;  Location: [facility];  Service: Endoscopy;  Laterality: N/A;  2:15pm   MALONEY  DILATION N/A 12/10/2015   Procedure: [name] DILATION;  Surgeon: Corbin Ade, MD;  Location: [facility];  Service: Endoscopy;  Laterality: N/A;   SKIN CANCER EXCISION     SMALL INTESTINE SURGERY N/A 1967   Resection    OB History   No obstetric history on file.      Home Medications    Prior to Admission medications   Medication Sig Start Date End Date Taking? Authorizing Provider  amoxicillin-clavulanate (AUGMENTIN) 875-125 MG tablet Take 1 tablet by mouth every 12 (twelve) hours. 11/01/22  Yes Particia Nearing, PA-C  predniSONE (DELTASONE) 20 MG tablet Take 2 tablets (40 mg total) by mouth daily with breakfast. 11/01/22  Yes Particia Nearing, PA-C  albuterol (VENTOLIN HFA) 108 (90 Base) MCG/ACT inhaler Inhale 2 puffs into the lungs every 4 (four) hours as needed for wheezing or shortness of breath. 12/29/21   Nyoka Cowden, MD  Ascorbic Acid (VITAMIN C PO) Take 1 tablet by mouth daily.    [provider]  aspirin EC 81 MG tablet Take 81 mg by mouth daily.     [provider]  azithromycin (ZITHROMAX) 250 MG tablet Take 1 tablet (250 mg total) by mouth daily. Take first 2 tablets together, then 1 every day until finished. 05/18/22   Valentino Nose, NP  Multiple Vitamin (MULTIVITAMIN WITH MINERALS) TABS Take 1 tablet by mouth daily.    [provider]  Ailene Ards  3 1000 MG CAPS Take 1,000 mg by mouth daily.    [provider]  pantoprazole (PROTONIX) 40 MG tablet Take 1 tablet (40 mg total) by mouth daily. Take 30 minutes before breakfast 12/02/21   Gelene Mink, NP    Family History Family History  Problem Relation Age of Onset   Colon polyps Sister    Colon polyps Brother    Colon cancer Neg Hx     Social History Social History   Tobacco Use   Smoking status: Every Day    Current packs/day: 0.50    Average packs/day: 0.5 packs/day for 50.0 years (25.0 ttl pk-yrs)    Types: Cigarettes    Passive exposure: Current    Smokeless tobacco: Never   Tobacco comments:    currently at 1/2 ppd 12/26/19//lmr  Vaping Use   Vaping status: Never Used  Substance Use Topics   Alcohol use: No   Drug use: No     Allergies   Lasix [furosemide]   Review of Systems Review of Systems PER HPI  Physical Exam Triage Vital Signs ED Triage Vitals  Encounter Vitals Group     BP 11/01/22 1521 (!) 146/75     Systolic BP Percentile --      Diastolic BP Percentile --      Pulse Rate 11/01/22 1521 (!) 106     Resp 11/01/22 1521 (!) 22     Temp 11/01/22 1521 98.7 F (37.1 C)     Temp Source 11/01/22 1521 Oral     SpO2 11/01/22 1521 (!) 87 %     Weight --      Height --      Head Circumference --      Peak Flow --      Pain Score 11/01/22 1522 5     Pain Loc --      Pain Education --      Exclude from Growth Chart --    No data found.  Updated Vital Signs BP (!) 146/75 (BP Location: Right Arm)   Pulse (!) 106   Temp 98.7 F (37.1 C) (Oral)   Resp (!) 22   SpO2 (!) 87%   Visual Acuity Right Eye Distance:   Left Eye Distance:   Bilateral Distance:    Right Eye Near:   Left Eye Near:    Bilateral Near:     Physical Exam Vitals and nursing note reviewed.  Constitutional:      General: She is not in acute distress.    Appearance: Normal appearance.  HENT:     Head: Atraumatic.     Right Ear: Tympanic membrane and external ear normal.     Left Ear: Tympanic membrane and external ear normal.     Nose: Nose normal.     Mouth/Throat:     Mouth: Mucous membranes are moist.     Pharynx: No posterior oropharyngeal erythema.  Eyes:     Extraocular Movements: Extraocular movements intact.     Conjunctiva/sclera: Conjunctivae normal.  Cardiovascular:     Rate and Rhythm: Normal rate and regular rhythm.     Heart sounds: Normal heart sounds.  Pulmonary:     Effort: Pulmonary effort is normal.     Breath sounds: Wheezing present. No rales.     Comments: Speaking in full sentences, appears  comfortable and in no acute distress. Breath sounds significantly decreased throughout Musculoskeletal:        General: Normal range of motion.  Cervical back: Normal range of motion and neck supple.  Skin:    General: Skin is warm and dry.  Neurological:     Mental Status: She is alert and oriented to person, place, and time.  Psychiatric:        Mood and Affect: Mood normal.        Thought Content: Thought content normal.      UC Treatments / Results  Labs (all labs ordered are listed, but only abnormal results are displayed) Labs Reviewed - No data to display  EKG   Radiology DG Chest 2 View  Result Date: 11/01/2022 CLINICAL DATA:  Three day history of cough with shortness of breath and hypoxia. EXAM: CHEST - 2 VIEW COMPARISON:  05/18/2022 FINDINGS: Lungs are hyperinflated inflated without acute airspace consolidation or effusion. Subtle chronic interstitial prominence over the mid to lower lungs. Cardiomediastinal silhouette and remainder of the exam is unchanged. IMPRESSION: 1. No acute cardiopulmonary disease. 2. Hyperinflation with subtle chronic interstitial prominence over the mid to lower lungs. Electronically Signed   By: Elberta Fortis M.D.   On: 11/01/2022 16:02    Procedures Procedures (including critical care time)  Medications Ordered in UC Medications  ipratropium-albuterol (DUONEB) 0.5-2.5 (3) MG/3ML nebulizer solution 3 mL (3 mLs Nebulization Given 11/01/22 1540)    Initial Impression / Assessment and Plan / UC Course  I have reviewed the triage vital signs and the nursing notes.  Pertinent labs & imaging results that were available during my care of the patient were reviewed by me and considered in my medical decision making (see chart for details).     Hypertensive, hypoxic between 83%-87% on room air, tachycardic and tachypneic throughout time in clinic even post nebulizer treatment with duoneb. CXR today neg for acute cardiopulmonary abnormality but  discussed with patient given unstable vital signs my recommendation is going to ED for immediate further management and longer term monitoring. She is adamant about  not going at this time after multiple conversations so will treat with prednisone, augmentin and neb treatments at home every 2-4 hours and recommended Pulm f/u in next 1-2 days. ED for worsening sxs at any time.   Final Clinical Impressions(s) / UC Diagnoses   Final diagnoses:  COPD exacerbation (HCC)  Hypoxia   Discharge Instructions   None    ED Prescriptions     Medication Sig Dispense Auth. Provider   amoxicillin-clavulanate (AUGMENTIN) 875-125 MG tablet Take 1 tablet by mouth every 12 (twelve) hours. 14 tablet Particia Nearing, [place]   predniSONE (DELTASONE) 20 MG tablet Take 2 tablets (40 mg total) by mouth daily with breakfast. 10 tablet Particia Nearing, [place]      PDMP not reviewed this encounter.   Particia Nearing, [place] 11/02/22 2200

## 2022-11-03 ENCOUNTER — Ambulatory Visit: Payer: Medicare HMO | Admitting: Internal Medicine

## 2022-11-03 ENCOUNTER — Encounter: Payer: Self-pay | Admitting: Internal Medicine

## 2022-11-03 VITALS — BP 146/60 | HR 105 | Ht 62.0 in | Wt 105.2 lb

## 2022-11-03 DIAGNOSIS — J449 Chronic obstructive pulmonary disease, unspecified: Secondary | ICD-10-CM | POA: Diagnosis not present

## 2022-11-03 DIAGNOSIS — F17210 Nicotine dependence, cigarettes, uncomplicated: Secondary | ICD-10-CM

## 2022-11-03 DIAGNOSIS — A159 Respiratory tuberculosis unspecified: Secondary | ICD-10-CM

## 2022-11-03 MED ORDER — BREZTRI AEROSPHERE 160-9-4.8 MCG/ACT IN AERO: 2.0000 | INHALATION_SPRAY | Freq: Two times a day (BID) | RESPIRATORY_TRACT | 11 refills | Status: DC

## 2022-11-03 MED ORDER — BREZTRI AEROSPHERE 160-9-4.8 MCG/ACT IN AERO: 2.0000 | INHALATION_SPRAY | Freq: Two times a day (BID) | RESPIRATORY_TRACT | Status: DC

## 2022-11-03 NOTE — Assessment & Plan Note (Addendum)
Counseled re importance of smoking cessation but did not meet time criteria for separate billing            Each maintenance medication was reviewed in detail including emphasizing most importantly the difference between maintenance and prns and under what circumstances the prns are to be triggered using an action plan format where appropriate.  Total time for H and P, chart review, counseling, reviewing hfa/neb device(s) and generating customized AVS unique to this office visit / same day charting > 30 min

## 2022-11-03 NOTE — Progress Notes (Signed)
Karen Church, female    DOB: 03-Jul-1950    MRN: [id number]   Brief patient profile:  3 yowf active smoker with pattern of acute flares of cough with purulent sptum and pleuritic pain usually same area most noted in back, sometimes in front with most recent cxr's form 11/21/19 showing persistent RML atx/ nodular changes R base so referred to pulmonary clinic in [place]  12/26/2019 by [name]      History of Present Illness  12/26/2019  Pulmonary/ 1st office eval/ Sora Olivo / [place] [facility]  Chief Complaint  Patient presents with   Pulmonary Consult    Referred by [name], NP for eval of abnormal cxr. Pt states has had PNA multiple times since 2008.  She had PNA last in Oct 2021- has had some weakness since then but overall her breathing is doing well. She has prod cough with clear sputum.  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 Continue trelegy daily  Nebulizer (machine) can be used up to every 4 hours if needed  Please schedule a follow up visit in 3 months but call   with PFTs on return   I very strongly recommend you get the moderna or pfizer vaccine     04/24/2022  f/u ov/[place] office/[facility] [place] re: copd  maint on prn saba   Chief Complaint  Patient presents with   Follow-up    Pt f/u for COPD states that she is doing well and breathing is baseline  Dyspnea:  Not limited by breathing from desired activities  /  still doing landscaping  Cough: am congestion white / also worse breathing x first hour each am   Sleeping: 45 degrees recliner  SABA use:  saba few times a month  /  has neb very rarely  02: none   Rec.  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    Also denies any obvious fluctuation of symptoms with weather or environmental changes or other aggravating or alleviating factors except as outlined above   No unusual exposure hx or h/o childhood pna/ asthma or knowledge of premature birth.  Current Allergies, Complete Past Medical  History, Past Surgical History, Family History, and Social History were reviewed in [employer] record.  ROS  The following are not active complaints unless bolded Hoarseness, sore throat, dysphagia, dental problems, itching, sneezing,  nasal congestion or discharge of excess mucus or purulent secretions, ear ache,   fever, chills, sweats, unintended wt loss or wt gain, classically pleuritic or exertional cp,  orthopnea pnd or arm/hand swelling  or leg swelling, presyncope, palpitations, abdominal pain, anorexia, nausea, vomiting, diarrhea  or change in bowel habits or change in bladder habits, change in stools or change in urine, dysuria, hematuria,  rash, arthralgias, visual complaints, headache, numbness, weakness or ataxia  or problems with walking or coordination,  change in mood or  memory.            Past Medical History:  Diagnosis Date   Carpal tunnel syndrome    COPD (chronic obstructive pulmonary disease) (HCC)         Objective:    wts  11/03/2022       105 04/24/2022       106  12/29/2021     102   02/21/20 113 lb 12.8 oz (51.6 kg)  12/26/19 113 lb (51.3 kg)  09/14/18 120 lb (54.4 kg)     Vital signs reviewed  11/03/2022  - Note at rest 02 sats  97% on RA   General appearance:    chronically ill appearing amb wf nad     HEENT :  Oropharynx  clear       NECK :  without JVD/Nodes/TM/ nl carotid upstrokes bilaterally   LUNGS: no acc muscle use,  Mod barrel  contour chest wall with bilateral  Distant exp wheeze and  without cough on insp or exp maneuvers and mod  Hyperresonant  to  percussion bilaterally     CV:  RRR  no s3 or murmur or increase in P2, and no edema   ABD:  soft and nontender  MS:   Ext warm without deformities or   obvious joint restrictions , calf tenderness, cyanosis or clubbing  SKIN: warm and dry without lesions    NEURO:  alert, approp, nl sensorium with  no motor or cerebellar deficits apparent.        I personally reviewed images and agree with radiology impression as follows:  Chest CT 10/19/22  with contrast 1. Continued stability of multiple lung nodules the largest in the left apex measuring 1 cm. 2. Emphysematous scarring.       Assessment     Outpatient Encounter Medications as of 11/03/2022  Medication Sig   Budeson-Glycopyrrol-Formoterol (BREZTRI AEROSPHERE) 160-9-4.8 MCG/ACT AERO Inhale 2 puffs into the lungs 2 (two) times daily. Take 2 puffs first thing in am and then another 2 puffs about 12 hours later.   [DISCONTINUED] Budeson-Glycopyrrol-Formoterol (BREZTRI AEROSPHERE) 160-9-4.8 MCG/ACT AERO Inhale 2 puffs into the lungs in the morning and at bedtime.   albuterol (VENTOLIN HFA) 108 (90 Base) MCG/ACT inhaler Inhale 2 puffs into the lungs  every 4 (four) hours as needed for wheezing or shortness of breath.   Ascorbic Acid (VITAMIN C PO) Take 1 tablet by mouth daily.   aspirin EC 81 MG tablet Take 81 mg by mouth daily.    Multiple Vitamin (MULTIVITAMIN WITH MINERALS) TABS Take 1 tablet by mouth daily.   Omega 3 1000 MG CAPS Take 1,000 mg by mouth daily.   pantoprazole (PROTONIX) 40 MG tablet Take 1 tablet (40 mg total) by mouth daily.  Take 30 minutes before breakfast   [DISCONTINUED] amoxicillin-clavulanate (AUGMENTIN) 875-125 MG tablet Take 1 tablet by mouth every 12 (twelve) hours.   [DISCONTINUED] azithromycin (ZITHROMAX) 250 MG tablet Take 1 tablet (250 mg total) by mouth daily. Take first 2 tablets together, then 1 every day until finished.   [DISCONTINUED] predniSONE (DELTASONE) 20 MG tablet Take 2 tablets (40 mg total) by mouth daily with breakfast.   No facility-administered encounter medications on file as of 11/03/2022.

## 2022-11-03 NOTE — Assessment & Plan Note (Signed)
See cxr 12/26/2019 > minimal residual atx  - HRCT chest 01/22/20 1. There are innumerable new pulmonary nodules throughout the lungs,most concentrated in the upper lobes. The majority of these nodules are ground-glass in character, however several are solid and spiculated in morphology, the largest in the posterior left upper lobe measuring 1.0 x 0.9 cm and in the right upper lobe measuring 0.9 x 0.6 cm. Multiplicity and distribution are generally most suggestive of atypical infection, however morphology of the largest solid nodules is concerning for malignancy. Recommend initial follow-up CT in 3 months to assess for stability or resolution, and it may be further necessary to characterize the largest nodules for FDG avidity by PET-CT. Recommend multidisciplinary thoracic referral for further evaluation. 2. Small consolidations of the medial segment right middle lobe and lingula, similar to prior examination and consistent with sequelae of prior atypical infection, including atypical mycobacterium. 3. Diffuse bilateral bronchial wall thickening, consistent with nonspecific infectious or inflammatory bronchitis. 4. No significant bronchiectasis. 5. Emphysema. 6. Coronary artery disease. Aortic atherosclerosis.rec >> return for pfts, quant TB and Quant Ig's as well as Alpha one /  esr/eos and sputum for routine and afb if sample avail> ordered 02/21/2020  - Sputum 02/22/20  NO WBC SEEN FEW GRAM POSITIVE COCCI IN PAIRS RARE GRAM POSITIVE RODS > nl flora  - Quantiferon Gold TB  02/21/20  Neg  - Sputum  02/29/20  afb neg smear >>> neg culture - assoc wt loss 12/29/2021 > repeat CT chest s contrast  01/29/22 no change, resume routine screening if qualifies   Chest CT 10/19/22  with contrast 1. Continued stability of multiple lung nodules the largest in the left apex measuring 1 cm. 2. Emphysematous scarring.   >>> f/u yearly as part of LCS  Discussed in detail all the  indications, usual  risks and  alternatives  relative to the benefits with patient who agrees to proceed with w/u as outlined.

## 2022-11-03 NOTE — Patient Instructions (Addendum)
Plan A = Automatic = Always=    Breztri Take 2 puffs first thing in am and then another 2 puffs about 12 hours later.   Work on inhaler technique:  relax and gently blow all the way out then take a nice smooth full deep breath back in, triggering the inhaler at same time you start breathing in.  Hold breath in for at least  5 seconds if you can. Blow out breztri  thru nose. Rinse and gargle with water when done.  If mouth or throat bother you at all,  try brushing teeth/gums/tongue with arm and hammer toothpaste/ make a slurry and gargle and spit out.   >>>  Remember how golfers warm up by taking practice swings - do this with an empty inhaler     Plan B = Backup (to supplement plan A, not to replace it) Only use your albuterol inhaler as a rescue medication to be used if you can't catch your breath by resting or doing a relaxed purse lip breathing pattern.  - The less you use it, the better it will work when you need it. - Ok to use the inhaler up to 2 puffs  every 4 hours if you must but call for appointment if use goes up over your usual need - Don't leave home without it !!  (think of it like the spare tire for your car)   Plan C = Crisis (instead of Plan B but only if Plan B stops working) - only use your albuterol nebulizer if you first try Plan B and it fails to help > ok to use the nebulizer up to every 4 hours but if start needing it regularly call for immediate appointment  The key is to stop smoking completely before smoking completely stops you!     Please schedule a follow up office visit in 6 weeks, call sooner if needed with all medications /inhalers/ solutions in hand so we can verify exactly what you are taking. This includes all medications from all doctors and over the counters

## 2022-11-03 NOTE — Assessment & Plan Note (Signed)
Active smoker/MM  - 12/26/2019  Rec mucinex / flutter and continue trelegy  - alpha one phenotype 02/21/20  MM level 213  - d/c'd trelegy ? Summer of 2023 and no worse at [facility] 12/29/2021 so left off and just rec saba prn  - PFT's  09/29/22  FEV1 0.92 (47 % ) ratio 0.54  p 12 % improvement from saba p 0 prior to study with DLCO  9.53 (55%)   and FV curve mildly concave   - 11/03/2022  After extensive coaching inhaler device,  effectiveness =    60% (short ti) > trial of breztri 2bid and f/u 6 weeks with option to change to symb 80/spiriva in case of MAI    Group D (now reclassified as E) in terms of symptom/risk and laba/lama/ICS  therefore appropriate rx at this point >>>  breztri samples with active wheeze on exam but if masters hfa and doing better at next ov consider lower dose of ICS due to concerns re MAI

## 2022-11-10 ENCOUNTER — Ambulatory Visit: Payer: Medicare HMO | Admitting: Internal Medicine

## 2022-11-16 ENCOUNTER — Ambulatory Visit: Payer: Medicare HMO | Admitting: Urology

## 2022-11-16 VITALS — BP 140/74 | HR 105

## 2022-11-16 DIAGNOSIS — D4959 Neoplasm of unspecified behavior of other genitourinary organ: Secondary | ICD-10-CM

## 2022-11-16 DIAGNOSIS — N1330 Unspecified hydronephrosis: Secondary | ICD-10-CM
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
## 2022-11-17 LAB — MICROSCOPIC EXAMINATION
Bacteria, UA: NONE SEEN
WBC, UA: NONE SEEN /HPF (ref 0–5)

## 2022-11-17 LAB — URINALYSIS, ROUTINE W REFLEX MICROSCOPIC
Bilirubin, UA: NEGATIVE
Glucose, UA: NEGATIVE
Ketones, UA: NEGATIVE
Leukocytes,UA: NEGATIVE
Nitrite, UA: NEGATIVE
Protein,UA: NEGATIVE
Specific Gravity, UA: 1.01 (ref 1.005–1.030)
Urobilinogen, Ur: 0.2 mg/dL (ref 0.2–1.0)
pH, UA: 6.5 (ref 5.0–7.5)

## 2022-11-24 ENCOUNTER — Other Ambulatory Visit: Payer: Self-pay | Admitting: Urology

## 2022-11-25 ENCOUNTER — Encounter (HOSPITAL_COMMUNITY): Payer: Self-pay | Admitting: Anesthesiology

## 2022-11-25 ENCOUNTER — Encounter (HOSPITAL_BASED_OUTPATIENT_CLINIC_OR_DEPARTMENT_OTHER): Payer: Self-pay | Admitting: Urology

## 2022-11-25 NOTE — Progress Notes (Signed)
Reviewed chart w/ anesthesia , Dr Armond Hang MDA, via phone.  Inquired for candidate for [facility] due to severe COPD but no oxygen and if put would need pulmonology clearance prior to surgery.  Dr Armond Hang stated patient would clearance prior to surgery with clearance would be ok to proceed @ [facility] barring any status change dos assessment by MDA. Called left message for Shanda Bumps, [place] scheduler for Dr Mena Goes, informed her patient will need pulmonology clearance prior to surgery and let her know goes to [facility] in [place].

## 2022-11-30 ENCOUNTER — Encounter: Payer: Self-pay | Admitting: Internal Medicine

## 2022-11-30 ENCOUNTER — Ambulatory Visit: Payer: Medicare HMO | Attending: Internal Medicine | Admitting: Internal Medicine

## 2022-11-30 VITALS — BP 142/70 | HR 79 | Ht 62.0 in | Wt 104.8 lb

## 2022-11-30 DIAGNOSIS — R079 Chest pain, unspecified: Secondary | ICD-10-CM

## 2022-11-30 DIAGNOSIS — Z136 Encounter for screening for cardiovascular disorders: Secondary | ICD-10-CM | POA: Diagnosis not present

## 2022-11-30 MED ORDER — NITROGLYCERIN 0.4 MG SL SUBL: 0.4000 mg | SUBLINGUAL_TABLET | SUBLINGUAL | 2 refills | Status: AC | PRN

## 2022-11-30 MED ORDER — ISOSORBIDE MONONITRATE ER 30 MG PO TB24: 30.0000 mg | ORAL_TABLET | Freq: Every day | ORAL | 2 refills | Status: DC

## 2022-11-30 MED ORDER — METOPROLOL TARTRATE 100 MG PO TABS: 100.0000 mg | ORAL_TABLET | Freq: Once | ORAL | 0 refills | Status: DC

## 2022-11-30 NOTE — Patient Instructions (Addendum)
Medication Instructions:  Your physician has recommended you make the following change in your medication:  Start Imdur 30 mg once daily Nitroglycerin Dissolve one under tongue for chest pain every 5 minutes up to 3 doses. If no relief, proceed to ED.   Labwork: BMET 1-2 weeks prior CTA to be completed at [facility]  Testing/Procedures: Your physician has requested that you have an echocardiogram. Echocardiography is a painless test that uses sound waves to create images of your heart. It provides your doctor with information about the size and shape of your heart and how well your heart's chambers and valves are working. This procedure takes approximately one hour. There are no restrictions for this procedure. Please do NOT wear cologne, perfume, aftershave, or lotions (deodorant is allowed). Please arrive 15 minutes prior to your appointment time.    Your cardiac CT will be scheduled at one of the below locations:   [facility] [address] [place], [place] [address] [phone]  If scheduled at [facility], please arrive at the [facility] Entrance (Entrance C2) of [facility] 30 minutes prior to test start time. You can use the FREE valet parking offered at entrance C (encouraged to control the heart rate for the test)  Proceed to the [facility] Radiology Department (first floor) to check-in and test prep.  All radiology patients and guests should use entrance C2 at [facility], accessed from [facility], even though the hospital's physical address listed is [address].     There is spacious parking and easy access to the radiology department from the [facility] entrance. Please enter here and check-in with the desk attendant.   Please follow these instructions carefully (unless otherwise directed):  An IV will be required for this test and Nitroglycerin will be given.   On the Night Before  the Test: Be sure to Drink plenty of water. Do not consume any caffeinated/decaffeinated beverages or chocolate 12 hours prior to your test. Do not take any antihistamines 12 hours prior to your test. If the patient has contrast allergy: No allergy  On the Day of the Test: Drink plenty of water until 1 hour prior to the test. Do not eat any food 1 hour prior to test. You may take your regular medications prior to the test.  Take metoprolol 100 mg(Lopressor) two hours prior to test. FEMALES- please wear underwire-free bra if available, avoid dresses & tight clothing   After the Test: Drink plenty of water. After receiving IV contrast, you may experience a mild flushed feeling. This is normal. On occasion, you may experience a mild rash up to 24 hours after the test. This is not dangerous. If this occurs, you can take Benadryl 25 mg and increase your fluid intake. If you experience trouble breathing, this can be serious. If it is severe call 911 IMMEDIATELY. If it is mild, please call our office.  We will call to schedule your test 2-4 weeks out understanding that some insurance companies will need an authorization prior to the service being performed.   For more information and frequently asked questions, please visit our website : [email or web address]  For non-scheduling related questions, please contact the cardiac imaging nurse navigator should you have any questions/concerns: Cardiac Imaging Nurse Navigators Direct Office Dial: [phone]   For scheduling needs, including cancellations and rescheduling, please call [place], [phone].   Follow-Up: Your physician recommends that you schedule a follow-up appointment in: 3  months  Any Other Special Instructions Will Be Listed Below (If Applicable). Thank you for choosing [facility]!      If you need a refill on your cardiac medications before your next appointment, please call your pharmacy.

## 2022-11-30 NOTE — Progress Notes (Signed)
Cardiology Office Note  Date: 11/30/2022   ID: Jatinder, [place] 05/31/1950, MRN [id number]  PCP:  Benita Stabile, MD  Cardiologist:  Marjo Bicker, MD Electrophysiologist:  None   History of Present Illness: Karen Church is a 72 y.o. female known to have moderate to severe COPD, nicotine abuse was referred to cardiology clinic for evaluation chest tightness.  Chest tightness started around 2 to 3 months ago, with exertion and sometimes with rest, last for a few minutes and resolves with rest.  Smokes half pack a day.  Has baseline stable SOB, no recent worsening.  No leg swelling.  No dizziness, presyncope, syncope.  No palpitations.  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    Past Surgical History:  Procedure Laterality Date   ABDOMINAL HYSTERECTOMY     BALLOON DILATION N/A 08/11/2021   Procedure: BALLOON DILATION;  Surgeon: Lanelle Bal, DO;  [facility];  Service: Endoscopy;  Laterality: N/A;   BIOPSY  12/10/2015   Procedure: BIOPSY;  Surgeon: Corbin Ade, MD;  [facility];  Service: Endoscopy;;  Gastric    BIOPSY  08/11/2021   Procedure: BIOPSY;  Surgeon: Lanelle Bal, DO;  [facility];  Service: Endoscopy;;  gastric    CESAREAN SECTION     COLONOSCOPY N/A 12/10/2015   normal   ESOPHAGOGASTRODUODENOSCOPY N/A 12/10/2015   normal esophagus s/p dilation, +H.pylori   ESOPHAGOGASTRODUODENOSCOPY (EGD) WITH PROPOFOL N/A 08/11/2021   Procedure: ESOPHAGOGASTRODUODENOSCOPY (EGD) WITH PROPOFOL;  Surgeon: Lanelle Bal, DO;  [facility];  Service: Endoscopy;  Laterality: N/A;  2:15pm   MALONEY DILATION  N/A 12/10/2015   Procedure: [name] DILATION;  Surgeon: Corbin Ade, MD;  [facility];  Service: Endoscopy;  Laterality: N/A;   SKIN CANCER EXCISION     SMALL INTESTINE SURGERY N/A 1967   Resection    Current Outpatient Medications  Medication Sig Dispense Refill   albuterol (PROVENTIL) (2.5 MG/3ML) 0.083% nebulizer solution Take 2.5 mg by nebulization every 6 (six) hours as needed for wheezing or shortness of breath.     albuterol (VENTOLIN HFA) 108 (90 Base) MCG/ACT inhaler Inhale 2 puffs into the lungs every 4 (four) hours as needed for wheezing or shortness of breath. 8 g 2   Ascorbic Acid (VITAMIN C PO) Take 1 tablet by mouth daily.     aspirin EC 81 MG tablet Take 81 mg by mouth daily.      Budeson-Glycopyrrol-Formoterol (BREZTRI AEROSPHERE) 160-9-4.8 MCG/ACT AERO Inhale 2 puffs into the lungs 2 (two) times daily. Take 2 puffs first thing in am and then another 2 puffs about 12 hours later. 10.7 g 11   Multiple Vitamin (MULTIVITAMIN WITH MINERALS) TABS Take 1 tablet by mouth daily.     Omega 3 1000 MG CAPS Take 1,000 mg by mouth daily.     No current facility-administered medications for this visit.   Allergies:  Lasix [furosemide]   Social History: The patient  reports that she has been smoking cigarettes. She has a 25 pack-year smoking history. She has been exposed to tobacco  smoke. She has never used smokeless tobacco. She reports that she does not drink alcohol and does not use drugs.   Family History: The patient's family history includes Colon polyps in her brother and sister.   ROS:  Please see the history of present illness. Otherwise, complete review of systems is positive for none  All other systems are reviewed and negative.   Physical Exam: VS:  BP (!) 142/70   Pulse 79   Ht 5\' 2"  (1.575 m)   Wt 104 lb 12.8 oz (47.5 kg)   SpO2 93%   BMI 19.17 kg/m , BMI Body mass index is 19.17 kg/m.  Wt Readings from Last 3 Encounters:  11/30/22 104 lb 12.8 oz  (47.5 kg)  11/03/22 105 lb 3.2 oz (47.7 kg)  04/24/22 106 lb 6.4 oz (48.3 kg)    General: Patient appears comfortable at rest. HEENT: Conjunctiva and lids normal, oropharynx clear with moist mucosa. Neck: Supple, no elevated JVP or carotid bruits, no thyromegaly. Lungs: Clear to auscultation, nonlabored breathing at rest. Cardiac: Regular rate and rhythm, no S3 or significant systolic murmur, no pericardial rub. Abdomen: Soft, nontender, no hepatomegaly, bowel sounds present, no guarding or rebound. Extremities: No pitting edema, distal pulses 2+. Skin: Warm and dry. Musculoskeletal: No kyphosis. Neuropsychiatric: Alert and oriented x3, affect grossly appropriate.  Recent Labwork: No results found for requested labs within last 365 days.  No results found for: "CHOL", "TRIG", "HDL", "CHOLHDL", "VLDL", "LDLCALC", "LDLDIRECT"   Assessment and Plan:  Chest tightness: Ongoing chest tightness with exertion and sometimes with rest, last for a few minutes, frequency few times per week, and resolves with rest. Obtain CT cardiac and 2D echocardiogram.  Start Imdur 30 mg once daily, SL NTG 0.4 mg as needed.  ER precautions for chest pain provided.  Nicotine abuse: Smokes half a pack a day, smoking cessation counseling provided.  Moderate to severe COPD: Follows with pulm, on inhalers.   I spent a total duration of 45 minutes reviewing the prior notes, EKG, labs, face-to-face discussion/counseling of her medical condition, pathophysiology, evaluation, management, answering all her questions, ordering labs, test and documenting the findings in the note.     Medication Adjustments/Labs and Tests Ordered: Current medicines are reviewed at length with the patient today.  Concerns regarding medicines are outlined above.    Disposition:  Follow up  3 months  Signed Genasis Zingale Verne Spurr, MD, 11/30/2022 2:20 PM    [facility] at [facility] [address] [place], [place], [place]  [address]

## 2022-12-01 NOTE — Progress Notes (Addendum)
Refer to progress note from 11/25/22 from Harless Litten, RN. Per Dr. Armond Hang, MDA patient needed pulmonary clearance prior to surgery.  On 11/30/22, patient had an OV with Dr. Jenene Slicker @ [facility]. She was evaluated for chest tightness. Imdur and NTG were added to the patient's medications. An echocardiogram and CT Coronary were ordered. I reviewed this case with Dr. Mal Amabile, MDA. Per Dr. Mal Amabile, the patient needs to get these tests done and clearance from cardiology before she can have surgery. I called Shanda Bumps, [place] scheduler for Dr. Mena Goes and left her the above information.  Shanda Bumps was not in the office at [facility] today. I spoke with Pam at [facility] on 12/01/22 and let her know the above information. Pam was going to reschedule her surgery.

## 2022-12-02 NOTE — Progress Notes (Signed)
Addendum:   to Sterling Big RN progress note dated 12-01-2022 and my note dated 11-25-2022.  On 12-02-2022  called and left message for Shanda Bumps, [place] scheduler for Dr Mena Goes, informed of pt's appointment's in epic that have been made for echo on 10/30, coronary CT morph 11/06 , and Dr Sherene Sires (pulmonology) 11/05 , since pt was still on [facility] schedule and needs to rescheduled after this with both pulm/ cardio clearance's.

## 2022-12-04 ENCOUNTER — Ambulatory Visit (HOSPITAL_BASED_OUTPATIENT_CLINIC_OR_DEPARTMENT_OTHER): Admission: RE | Admit: 2022-12-04 | Payer: Medicare HMO | Source: Home / Self Care | Admitting: Urology

## 2022-12-04 HISTORY — DX: Chronic obstructive pulmonary disease, unspecified: J44.9

## 2022-12-04 SURGERY — CYSTOURETEROSCOPY, WITH RETROGRADE PYELOGRAM AND STENT INSERTION
Anesthesia: General | Laterality: Bilateral

## 2022-12-09 ENCOUNTER — Ambulatory Visit: Payer: Medicare HMO | Attending: Internal Medicine

## 2022-12-09 DIAGNOSIS — R079 Chest pain, unspecified: Secondary | ICD-10-CM

## 2022-12-14 ENCOUNTER — Encounter (HOSPITAL_COMMUNITY): Payer: Self-pay

## 2022-12-14 ENCOUNTER — Encounter: Payer: Medicare HMO | Admitting: Urology

## 2022-12-14 LAB — ECHOCARDIOGRAM COMPLETE
AR max vel: 1.95 cm2
AV Area VTI: 2.39 cm2
AV Area mean vel: 2.2 cm2
AV Mean grad: 4 mm[Hg]
AV Peak grad: 8.2 mm[Hg]
Ao pk vel: 1.43 m/s
Area-P 1/2: 2.65 cm2
Calc EF: 76.2 %
MV VTI: 1.8 cm2
S' Lateral: 1.6 cm
Single Plane A2C EF: 85 %
Single Plane A4C EF: 61.1 %

## 2022-12-15 ENCOUNTER — Other Ambulatory Visit (HOSPITAL_COMMUNITY): Payer: Self-pay | Admitting: *Deleted

## 2022-12-15 ENCOUNTER — Telehealth: Payer: Self-pay | Admitting: Internal Medicine

## 2022-12-15 ENCOUNTER — Telehealth (HOSPITAL_COMMUNITY): Payer: Self-pay | Admitting: *Deleted

## 2022-12-15 ENCOUNTER — Ambulatory Visit: Payer: Medicare HMO | Admitting: Internal Medicine

## 2022-12-15 MED ORDER — METOPROLOL TARTRATE 100 MG PO TABS: 100.0000 mg | ORAL_TABLET | Freq: Once | ORAL | 0 refills | Status: DC

## 2022-12-15 NOTE — Telephone Encounter (Signed)
Attempted to call patient regarding upcoming cardiac CT appointment. °Left message on voicemail with name and callback number ° °Edie Vallandingham RN Navigator Cardiac Imaging °[facility] °[phone] Office °[phone] Cell ° °

## 2022-12-15 NOTE — Telephone Encounter (Signed)
Message left on voicemail that lopressor rx was sent and received by Karen Church today at 1:30 pm and she should be able to pick this up.

## 2022-12-15 NOTE — Telephone Encounter (Signed)
Pt states she accidentally took the pill already before the procedure tomorrow and she states she needs it called back in for her to take before the procedure. Please advise

## 2022-12-16 ENCOUNTER — Ambulatory Visit (HOSPITAL_COMMUNITY)
Admission: RE | Admit: 2022-12-16 | Discharge: 2022-12-16 | Disposition: A | Discharge status: Home / Self Care | Payer: Medicare HMO | Source: Ambulatory Visit | Attending: Internal Medicine | Admitting: Internal Medicine

## 2022-12-16 DIAGNOSIS — I2510 Atherosclerotic heart disease of native coronary artery without angina pectoris: Secondary | ICD-10-CM | POA: Insufficient documentation

## 2022-12-16 DIAGNOSIS — R079 Chest pain, unspecified: Secondary | ICD-10-CM | POA: Insufficient documentation

## 2022-12-16 DIAGNOSIS — I700 Atherosclerosis of aorta: Secondary | ICD-10-CM | POA: Diagnosis not present

## 2022-12-16 MED ORDER — IOHEXOL 350 MG/ML SOLN
100.0000 mL | Freq: Once | INTRAVENOUS | Status: AC | PRN
Start: 2022-12-16 — End: 2022-12-16
  Administered 2022-12-16: 100 mL via INTRAVENOUS

## 2022-12-16 MED ORDER — NITROGLYCERIN 0.4 MG SL SUBL
SUBLINGUAL_TABLET | SUBLINGUAL | Status: AC
Start: 2022-12-16 — End: ?
  Filled 2022-12-16: qty 2

## 2022-12-16 MED ORDER — NITROGLYCERIN 0.4 MG SL SUBL
0.8000 mg | SUBLINGUAL_TABLET | Freq: Once | SUBLINGUAL | Status: AC
  Administered 2022-12-16: 0.8 mg via SUBLINGUAL

## 2022-12-17 ENCOUNTER — Other Ambulatory Visit: Payer: Self-pay | Admitting: Cardiology

## 2022-12-17 ENCOUNTER — Ambulatory Visit (HOSPITAL_COMMUNITY)
Admission: RE | Admit: 2022-12-17 | Discharge: 2022-12-17 | Disposition: A | Discharge status: Home / Self Care | Payer: Medicare HMO | Source: Ambulatory Visit | Attending: Cardiology | Admitting: Cardiology

## 2022-12-17 DIAGNOSIS — R931 Abnormal findings on diagnostic imaging of heart and coronary circulation: Secondary | ICD-10-CM | POA: Insufficient documentation

## 2023-01-01 ENCOUNTER — Encounter: Payer: Self-pay | Admitting: Internal Medicine

## 2023-01-01 ENCOUNTER — Ambulatory Visit: Payer: Medicare HMO | Admitting: Internal Medicine

## 2023-01-01 VITALS — BP 144/74 | HR 83 | Temp 97.0°F | Ht 62.0 in | Wt 106.6 lb

## 2023-01-01 DIAGNOSIS — F17210 Nicotine dependence, cigarettes, uncomplicated: Secondary | ICD-10-CM

## 2023-01-01 DIAGNOSIS — J449 Chronic obstructive pulmonary disease, unspecified: Secondary | ICD-10-CM

## 2023-01-01 MED ORDER — BREZTRI AEROSPHERE 160-9-4.8 MCG/ACT IN AERO: 2.0000 | INHALATION_SPRAY | Freq: Two times a day (BID) | RESPIRATORY_TRACT | 11 refills | Status: DC

## 2023-01-01 NOTE — Progress Notes (Unsigned)
Karen Church, female    DOB: [birth year]    MRN: [id number]   Brief patient profile:  72 yowf active smoker/MM  with pattern of acute flares of cough with purulent sptum and pleuritic pain usually same area most noted in back, sometimes in front with most recent cxr's form 11/21/19 showing persistent RML atx/ nodular changes R base so referred to pulmonary clinic in [place]  12/26/2019 by [name] and proved to have GOLD 3 copd 09/2022      History of Present Illness  12/26/2019  Pulmonary/ 1st office eval/ Kaelyn Nauta / [place] [facility]  Chief Complaint  Patient presents with   Pulmonary Consult    Referred by [name], NP for eval of abnormal cxr. Pt states has had PNA multiple times since 2008.  She had PNA last in Oct 2021- has had some weakness since then but overall her breathing is doing well. She has prod cough with clear sputum.  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 Continue trelegy daily  Nebulizer (machine) can be used up to every 4 hours if needed        04/24/2022  f/u ov/[place] office/Rael Tilly re: copd  maint on prn saba   Chief Complaint  Patient presents with   Follow-up    Pt f/u for COPD states that she is doing well and breathing is baseline  Dyspnea:  Not limited by breathing from desired activities  / still doing landscaping  Cough: am congestion white / also worse breathing x first hour each am    Sleeping: 45 degrees recliner  SABA use:  saba few times a month  /  has neb very rarely  02: none   Rec.  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  Work on inhaler technique:  >>>  Remember how golfers warm up by taking practice swings - do this with an empty inhaler  Plan B = Backup (to supplement plan A, not to replace it) Only use your albuterol inhaler as a rescue medication Plan C = Crisis (  instead of Plan B but only if Plan B stops working) - only use your albuterol nebulizer if you first try Plan B The key is to stop smoking completely before smoking completely stops you!  Please schedule a follow up office visit in 6 weeks, call sooner if needed with all medications /inhalers/ solutions in hand so we can verify exactly what you are taking. This includes all medications from all doctors and over the counters     01/01/2023  f/u ov/[place] office/Koreen Lizaola re: GOLD 3 copd  maint on no resp rx  / still smoking  Only brought breztri sample / w/u for microscopic hematuria needs scope and possible stent  Chief Complaint  Patient presents with   Follow-up    Needs risk assessment for kidney procedure. She is c/o increased chest tightness, prod cough with clear sputum, and SOB x 2 months. Still smoking.    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    Also denies any obvious fluctuation of symptoms with weather or environmental changes or other aggravating or alleviating factors except as outlined above   No unusual exposure hx or h/o childhood pna/ asthma or knowledge of premature birth.  Current Allergies, Complete Past Medical History, Past Surgical History, Family History, and Social History were reviewed in [employer] record.  ROS  The following are not active complaints unless bolded Hoarseness, sore throat, dysphagia, dental problems, itching, sneezing,  nasal congestion or discharge of excess mucus or purulent secretions, ear ache,   fever, chills, sweats, unintended wt loss or wt gain, classically pleuritic or exertional cp,  orthopnea pnd or arm/hand swelling  or leg swelling, presyncope, palpitations, abdominal pain, anorexia, nausea, vomiting, diarrhea  or change in bowel habits or change in bladder habits, change in stools or change in urine, dysuria, hematuria,  rash, arthralgias, visual complaints, headache, numbness, weakness or ataxia or problems with walking or coordination,  change in mood or  memory.        Current Meds  Medication Sig   albuterol (PROVENTIL) (2.5 MG/3ML) 0.083% nebulizer solution Take 2.5 mg by nebulization every 6 (six) hours as needed for wheezing or shortness of breath.   albuterol (VENTOLIN HFA) 108 (90 Base) MCG/ACT inhaler Inhale 2 puffs into the lungs every 4 (four) hours as needed for wheezing or shortness of breath.    Ascorbic Acid (VITAMIN C PO) Take 1 tablet by mouth daily.   aspirin EC 81 MG tablet Take 81 mg by mouth daily.    Multiple Vitamin (MULTIVITAMIN WITH MINERALS) TABS Take 1 tablet by mouth daily.   nitroGLYCERIN (NITROSTAT) 0.4 MG SL tablet Place 1 tablet (0.4 mg total) under the tongue every 5 (five) minutes x 3 doses as needed for chest pain (If no relief after 3rd dose proceed to ED or call 911).   Omega 3 1000 MG CAPS Take 1,000 mg by mouth daily.  Past Medical History:  Diagnosis Date   Carpal tunnel syndrome    COPD (chronic obstructive pulmonary disease) (HCC)    Objective:    wts  01/01/2023      106  11/03/2022       105 04/24/2022       106  12/29/2021     102   02/21/20 113 lb 12.8 oz (51.6 kg)  12/26/19 113 lb (51.3 kg)  09/14/18 120 lb (54.4 kg)    Vital signs reviewed  01/01/2023  - Note at rest 02 sats  92% on RA   General appearance:    amb   HEENT :  Oropharynx  clear/ poor dentition     NECK :  without JVD/Nodes/TM/ nl carotid upstrokes bilaterally   LUNGS: no acc muscle use,  Mod barrel  contour chest wall with bilateral coarse   insp/ exp rhonchi  and  without cough on insp or exp maneuvers and mod  Hyperresonant  to  percussion bilaterally     CV:  RRR  no s3 or murmur or increase in P2, and no edema   ABD:  soft and nontender with pos mid insp Hoover's  in the supine position. No bruits or organomegaly appreciated, bowel sounds nl  MS:   Ext warm without deformities or   obvious joint restrictions , calf tenderness, cyanosis or clubbing  SKIN: warm and dry without lesions    NEURO:  alert, approp, nl sensorium with  no motor or cerebellar deficits apparent.              I personally reviewed images and agree with radiology impression as follows:  Chest CT 10/19/22  with contrast 1. Continued stability of multiple lung nodules the largest in the left apex measuring 1 cm x 3 y 2. Emphysematous scarring.     I personally reviewed images  and agree with radiology impression as follows:  CXR: 11/01/22   pa and lateral 1. No acute cardiopulmonary disease. 2. Hyperinflation with subtle chronic interstitial prominence over the mid to lower lungs.    Assessment

## 2023-01-01 NOTE — Patient Instructions (Addendum)
The key is to stop smoking completely before smoking completely stops you but at least for 2 weeks pre-op  Plan A = Automatic = Always=    Breztri  Take 2 puffs first thing in am and then another 2 puffs about 12 hours later.    Work on inhaler technique:  relax and gently blow all the way out then take a nice smooth full deep breath back in, triggering the inhaler at same time you start breathing in.  Hold breath in for at least  5 seconds if you can. Blow out breztri  thru nose. Rinse and gargle with water when done.  If mouth or throat bother you at all,  try brushing teeth/gums/tongue with arm and hammer toothpaste/ make a slurry and gargle and spit out.  >>>  Remember how golfers warm up by taking practice swings - do this with an empty inhaler   Plan B = Backup (to supplement plan A, not to replace it) Only use your albuterol inhaler as a rescue medication to be used if you can't catch your breath by resting or doing a relaxed purse lip breathing pattern.  - The less you use it, the better it will work when you need it. - Ok to use the inhaler up to 2 puffs  every 4 hours if you must but call for appointment if use goes up over your usual need - Don't leave home without it !!  (think of it like the spare tire for your car)   Plan C = Crisis (instead of Plan B but only if Plan B stops working) - only use your albuterol nebulizer if you first try Plan B and it fails to help > ok to use the nebulizer up to every 4 hours but if start needing it regularly call for immediate appointment  Do plan C right before you leave home for your procedure  Please schedule a follow up visit in 3 months but call sooner if needed - bring inhalers

## 2023-01-02 NOTE — Assessment & Plan Note (Addendum)
Active smoker/MM  - 12/26/2019  Rec mucinex / flutter and continue trelegy  - alpha one phenotype 02/21/20  MM level 213  - d/c'd trelegy ? Summer of 2023 and no worse at [facility] 12/29/2021 so left off and just rec saba prn  - PFT's  09/29/22  FEV1 0.92 (47 % ) ratio 0.54  p 12 % improvement from saba p 0 prior to study with DLCO  9.53 (55%)   and FV curve mildly concave   - 11/03/2022  After extensive coaching inhaler device,  effectiveness =    60% (short ti) > trial of breztri 2bid and f/u 6 weeks with option to change to symb 80/spiriva in case of MAI  - 01/01/2023  After extensive coaching inhaler device,  effectiveness =    60% (poor insp effort, short Ti)     Group D (now reclassified as E) in terms of symptom/risk and laba/lama/ICS  therefore appropriate rx at this point >>>  continue bretri and already on approp saba  She is cleared for Urethral surgery with the caveat she use her breztri consistently in the meantime and stop smoking for 2 weeks preop         Each maintenance medication was reviewed in detail including emphasizing most importantly the difference between maintenance and prns and under what circumstances the prns are to be triggered using an action plan format where appropriate.  Total time for H and P, chart review, counseling, reviewing hfa/neb  device(s) and generating customized AVS unique to this office visit / same day charting = 30 min

## 2023-01-22 ENCOUNTER — Other Ambulatory Visit: Payer: Self-pay | Admitting: Urology

## 2023-02-19 ENCOUNTER — Encounter (HOSPITAL_COMMUNITY): Payer: Self-pay

## 2023-02-19 NOTE — Progress Notes (Addendum)
 COVID Vaccine Completed:  Date of COVID positive in last 71 days:No  PCP - Norleen Hurst, MD Cardiologist - Diannah Maywood, MD Pulmonologist - Ozell America, MD  Chest x-ray - 11-01-22 Epic EKG - 02-23-23 Epic Stress Test - N/A ECHO - 12-09-22 Epic Cardiac Cath - N/A Pacemaker/ICD device last checked: Spinal Cord Stimulator:  N/A Coronary CT - 12-17-22 Epic  Bowel Prep - N/A  Sleep Study - N/A CPAP -   Fasting Blood Sugar - N/A Checks Blood Sugar _____ times a day  Last dose of GLP1 agonist-  N/A GLP1 instructions:  Hold 7 days before surgery    Last dose of SGLT-2 inhibitors-  N/A SGLT-2 instructions:  Hold 3 days before surgery   Blood Thinner Instructions:  Time Aspirin  Instructions:  ASA 81.  Per patient to stop 7 days Last Dose:  Activity level:  Can go up a flight of stairs and perform activities of daily living without stopping and without symptoms of chest pain.  Patient has shortness of breath with exertion at times.  States that she has to sleep elevated.    Anesthesia review:  COPD, hx of chest tightness followed by cardiology. States that she has chest tightness occasionally.    Patient denies shortness of breath, fever, cough and chest pain at PAT appointment  Patient verbalized understanding of instructions that were given to them at the PAT appointment. Patient was also instructed that they will need to review over the PAT instructions again at home before surgery.

## 2023-02-19 NOTE — Patient Instructions (Addendum)
 SURGICAL WAITING ROOM VISITATION Patients having surgery or a procedure may have no more than 2 support people in the waiting area - these visitors may rotate.    Children under the age of 62 must have an adult with them who is not the patient.  Due to an increase in RSV and influenza rates and associated hospitalizations, children ages 56 and under may not visit patients in [facility] hospitals.  If the patient needs to stay at the hospital during part of their recovery, the visitor guidelines for inpatient rooms apply. Pre-op nurse will coordinate an appropriate time for 1 support person to accompany patient in pre-op.  This support person may not rotate.    Please refer to the [facility] website for the visitor guidelines for Inpatients (after your surgery is over and you are in a regular room).       Your procedure is scheduled on: 03-05-23   Report to [facility] Main Entrance    Report to admitting at 5:15 AM   Call this number if you have problems the morning of surgery [phone]   Do not eat food or drink liquids :After Midnight.           If you have questions, please contact your surgeon's office.   FOLLOW  ANY ADDITIONAL PRE OP INSTRUCTIONS YOU RECEIVED FROM YOUR SURGEON'S OFFICE!!!     Oral Hygiene is also important to reduce your risk of infection.                                    Remember - BRUSH YOUR TEETH THE MORNING OF SURGERY WITH YOUR REGULAR TOOTHPASTE   Do NOT smoke after Midnight   Take these medicines the morning of surgery with A SIP OF WATER :  Okay to use inhalers  Stop all vitamins and herbal supplements 7 days before surgery                              You may not have any metal on your body including hair pins, jewelry, and body piercing             Do not wear make-up, lotions, powders, perfumes or deodorant  Do not wear nail polish including gel and S&S, artificial/acrylic nails, or any other type of covering on natural nails  including finger and toenails. If you have artificial nails, gel coating, etc. that needs to be removed by a nail salon please have this removed prior to surgery or surgery may need to be canceled/ delayed if the surgeon/ anesthesia feels like they are unable to be safely monitored.   Do not shave  48 hours prior to surgery.    Do not bring valuables to the hospital. [facility] IS NOT RESPONSIBLE   FOR VALUABLES.   Contacts, dentures or bridgework may not be worn into surgery.  DO NOT BRING YOUR HOME MEDICATIONS TO THE HOSPITAL. PHARMACY WILL DISPENSE MEDICATIONS LISTED ON YOUR MEDICATION LIST TO YOU DURING YOUR ADMISSION IN THE HOSPITAL!    Patients discharged on the day of surgery will not be allowed to drive home.  Someone NEEDS to stay with you for the first 24 hours after anesthesia.              Please read over the following fact sheets you were given: IF YOU HAVE QUESTIONS ABOUT YOUR  PRE-OP INSTRUCTIONS PLEASE CALL [phone] G[facility]  If you received a COVID test during your pre-op visit  it is requested that you wear a mask when out in public, stay away from anyone that may not be feeling well and notify your surgeon if you develop symptoms. If you test positive for Covid or have been in contact with anyone that has tested positive in the last 10 days please notify you surgeon.  [place] - Preparing for Surgery Before surgery, you can play an important role.  Because skin is not sterile, your skin needs to be as free of germs as possible.  You can reduce the number of germs on your skin by washing with CHG (chlorahexidine gluconate) soap before surgery.  CHG is an antiseptic cleaner which kills germs and bonds with the skin to continue killing germs even after washing. Please DO NOT use if you have an allergy to CHG or antibacterial soaps.  If your skin becomes reddened/irritated stop using the CHG and inform your nurse when you arrive at Short Stay. Do not shave (including legs and  underarms) for at least 48 hours prior to the first CHG shower.  You may shave your face/neck.  Please follow these instructions carefully:  1.  Shower with CHG Soap the night before surgery and the  morning of surgery.  2.  If you choose to wash your hair, wash your hair first as usual with your normal  shampoo.  3.  After you shampoo, rinse your hair and body thoroughly to remove the shampoo.                             4.  Use CHG as you would any other liquid soap.  You can apply chg directly to the skin and wash.  Gently with a scrungie or clean washcloth.  5.  Apply the CHG Soap to your body ONLY FROM THE NECK DOWN.   Do   not use on face/ open                           Wound or open sores. Avoid contact with eyes, ears mouth and   genitals (private parts).                       Wash face,  Genitals (private parts) with your normal soap.             6.  Wash thoroughly, paying special attention to the area where your    surgery  will be performed.  7.  Thoroughly rinse your body with warm water  from the neck down.  8.  DO NOT shower/wash with your normal soap after using and rinsing off the CHG Soap.                9.  Pat yourself dry with a clean towel.            10.  Wear clean pajamas.            11.  Place clean sheets on your bed the night of your first shower and do not  sleep with pets. Day of Surgery : Do not apply any lotions/deodorants the morning of surgery.  Please wear clean clothes to the hospital/surgery center.  FAILURE TO FOLLOW THESE INSTRUCTIONS MAY RESULT IN THE CANCELLATION OF YOUR SURGERY  PATIENT SIGNATURE_________________________________  NURSE  SIGNATURE__________________________________  ________________________________________________________________________

## 2023-02-23 ENCOUNTER — Encounter (HOSPITAL_COMMUNITY)
Admission: RE | Admit: 2023-02-23 | Discharge: 2023-02-23 | Disposition: A | Discharge status: Home / Self Care | Payer: Medicare HMO | Source: Ambulatory Visit | Attending: Urology

## 2023-02-23 ENCOUNTER — Encounter (HOSPITAL_COMMUNITY): Payer: Self-pay

## 2023-02-23 ENCOUNTER — Other Ambulatory Visit: Payer: Self-pay

## 2023-02-23 VITALS — BP 141/80 | HR 92 | Temp 98.1°F | Resp 16 | Ht 63.0 in | Wt 103.6 lb

## 2023-02-23 DIAGNOSIS — Z01818 Encounter for other preprocedural examination: Secondary | ICD-10-CM | POA: Insufficient documentation

## 2023-02-23 DIAGNOSIS — N289 Disorder of kidney and ureter, unspecified: Secondary | ICD-10-CM | POA: Diagnosis not present

## 2023-02-23 DIAGNOSIS — I2510 Atherosclerotic heart disease of native coronary artery without angina pectoris: Secondary | ICD-10-CM | POA: Insufficient documentation

## 2023-02-23 HISTORY — DX: Pneumonia, unspecified organism: J18.9

## 2023-02-23 HISTORY — DX: Unspecified hydronephrosis: N13.30

## 2023-02-23 HISTORY — DX: Dyspnea, unspecified: R06.00

## 2023-02-23 HISTORY — DX: Unspecified osteoarthritis, unspecified site: M19.90

## 2023-02-23 HISTORY — DX: Malignant melanoma of skin, unspecified: C43.9

## 2023-02-23 LAB — BASIC METABOLIC PANEL
Anion gap: 8 (ref 5–15)
BUN: 13 mg/dL (ref 8–23)
CO2: 29 mmol/L (ref 22–32)
Calcium: 9.7 mg/dL (ref 8.9–10.3)
Chloride: 101 mmol/L (ref 98–111)
Creatinine, Ser: 0.73 mg/dL (ref 0.44–1.00)
GFR, Estimated: >60 mL/min (ref >60)
Glucose, Bld: 83 mg/dL (ref 70–99)
Potassium: 4.1 mmol/L (ref 3.5–5.1)
Sodium: 138 mmol/L (ref 135–145)

## 2023-02-24 NOTE — Progress Notes (Signed)
 Anesthesia Chart Review   Case: [id number] Date/Time: 03/05/23 0715   Procedures:      CYSTOSCOPY WITH BILATERAL RETROGRADE PYELOGRAM, RIGHT URETEROSCOPY AND STENT PLACEMENT (Bilateral)     CYSTOSCOPY WITH BIOPSY - 75 MINS FOR CASE   Anesthesia type: General   Pre-op diagnosis: RIGHT URETERAL NEOPLASM RIGHT HYDRONEPHROSIS   Location: [facility] PROCEDURE [facility] / [facility]   Surgeons: Nieves Cough, MD       DISCUSSION:73 y.o. smoker with h/o COPD, right ureteral neoplasm, right hydronephrosis scheduled for above procedure 03/05/2023 with Dr. Cough Nieves.   Per pulmonology note 01/02/2023, She is cleared for Urethral surgery with the caveat she use her breztri  consistently in the meantime and stop smoking for 2 weeks preop   Stable at PAT visit. Evaluate DOS.  VS: BP (!) 141/80   Pulse 92   Temp 36.7 C (Oral)   Resp 16   Ht 5' 3 (1.6 m)   Wt 47 kg   SpO2 93%   BMI 18.35 kg/m   PROVIDERS: Shona Norleen PEDLAR, MD is PCP   Darlean Sharper, MD is Pulmonologist   LABS: Labs reviewed: Acceptable for surgery. (all labs ordered are listed, but only abnormal results are displayed)  Labs Reviewed  BASIC METABOLIC PANEL     IMAGES:   EKG:   CV: CT Coronary FFR 12/17/2022 IMPRESSION: 1. CT FFR analysis did not show any significant stenosis. In the RCA, there is a decrease in FFR from 0.99 to 0.86 after the first stenosis (not significant), and then there is a drop from 0.86 to 0.80 after the second stenosis (delta 0.06, not significant). Recommend aggressive medical management.  Echo 12/09/2022 1. Left ventricular ejection fraction, by estimation, is 60 to 65%. The  left ventricle has normal function. The left ventricle has no regional  wall motion abnormalities. Left ventricular diastolic parameters are  indeterminate. The average left  ventricular global longitudinal strain is -20.6 %. The global longitudinal  strain is normal.   2. Right ventricular systolic function is  normal. The right ventricular  size is normal. Tricuspid regurgitation signal is inadequate for assessing  PA pressure.   3. The mitral valve is normal in structure. No evidence of mitral valve  regurgitation. No evidence of mitral stenosis.   4. The aortic valve is tricuspid. Aortic valve regurgitation is not  visualized. No aortic stenosis is present.   5. The inferior vena cava is normal in size with greater than 50%  respiratory variability, suggesting right atrial pressure of 3 mmHg.   Comparison(s): No prior study.  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   Dyspnea    Hydronephrosis of right kidney    Melanoma (HCC)    R arm   Pneumonia     Past Surgical History:  Procedure Laterality Date   ABDOMINAL HYSTERECTOMY     BALLOON DILATION N/A 08/11/2021   Procedure: BALLOON DILATION;  Surgeon: Cindie Carlin POUR, DO;  Location[facility];  Service: Endoscopy;  Laterality: N/A;   BIOPSY  12/10/2015   Procedure: BIOPSY;  Surgeon: Lamar CHRISTELLA Hollingshead, MD;  Location: [facility];  Service: Endoscopy;;  Gastric    BIOPSY  08/11/2021   Procedure: BIOPSY;  Surgeon: Cindie Carlin POUR, DO;  Location: [facility];  Service: Endoscopy;;  gastric    CESAREAN SECTION     COLONOSCOPY N/A 12/10/2015   normal   ESOPHAGOGASTRODUODENOSCOPY N/A 12/10/2015   normal esophagus s/p dilation, +H.pylori   ESOPHAGOGASTRODUODENOSCOPY (EGD) WITH PROPOFOL  N/A 08/11/2021   Procedure: ESOPHAGOGASTRODUODENOSCOPY (EGD) WITH PROPOFOL ;  Surgeon: Cindie Carlin POUR, DO;  Location[facility];  Service: Endoscopy;  Laterality: N/A;  2:15pm   MALONEY DILATION N/A 12/10/2015   Procedure:  AGAPITO DILATION;  Surgeon: Lamar CHRISTELLA Hollingshead, MD;  Location: [facility];  Service: Endoscopy;  Laterality: N/A;   SKIN CANCER EXCISION     SMALL INTESTINE SURGERY N/A 1967   Resection    MEDICATIONS:  albuterol  (PROVENTIL ) (2.5 MG/3ML) 0.083% nebulizer solution   albuterol  (VENTOLIN  HFA) 108 (90 Base) MCG/ACT inhaler   aspirin  EC 81 MG tablet   Budeson-Glycopyrrol-Formoterol  (BREZTRI  AEROSPHERE) 160-9-4.8 MCG/ACT AERO   cholecalciferol (VITAMIN D3) 25 MCG (1000 UNIT) tablet   Multiple Vitamin (MULTIVITAMIN WITH MINERALS) TABS   nitroGLYCERIN  (NITROSTAT ) 0.4 MG SL tablet   Omega 3 1000 MG CAPS   No current facility-administered medications for this encounter.     Harlene Hoots Ward, PA-C [facility] Pre-Surgical Testing [phone]

## 2023-03-04 NOTE — Anesthesia Preprocedure Evaluation (Addendum)
Anesthesia Evaluation  Patient identified by MRN, date of birth, ID band Patient awake    Reviewed: Allergy & Precautions, NPO status , Patient's Chart, lab work & pertinent test results  Airway Mallampati: I  TM Distance: >3 FB Neck ROM: Full    Dental  (+) Missing, Loose, Chipped, Poor Dentition   Pulmonary COPD,  COPD inhaler, Current Smoker and Patient abstained from smoking.    + decreased breath sounds      Cardiovascular negative cardio ROS  Rhythm:Regular Rate:Normal     Neuro/Psych  Neuromuscular disease  negative psych ROS   GI/Hepatic negative GI ROS, Neg liver ROS,,,  Endo/Other  negative endocrine ROS    Renal/GU Renal disease     Musculoskeletal  (+) Arthritis ,    Abdominal   Peds  Hematology negative hematology ROS (+)   Anesthesia Other Findings   Reproductive/Obstetrics                             Anesthesia Physical Anesthesia Plan  ASA: 3  Anesthesia Plan: General   Post-op Pain Management: Tylenol PO (pre-op)*   Induction: Intravenous  PONV Risk Score and Plan: 3 and Ondansetron, Dexamethasone, Midazolam and Treatment may vary due to age or medical condition  Airway Management Planned: LMA  Additional Equipment: None  Intra-op Plan:   Post-operative Plan: Extubation in OR  Informed Consent: I have reviewed the patients History and Physical, chart, labs and discussed the procedure including the risks, benefits and alternatives for the proposed anesthesia with the patient or authorized representative who has indicated his/her understanding and acceptance.     Dental advisory given  Plan Discussed with: CRNA  Anesthesia Plan Comments:        Anesthesia Quick Evaluation

## 2023-03-05 ENCOUNTER — Ambulatory Visit (HOSPITAL_COMMUNITY): Payer: Medicare HMO

## 2023-03-05 ENCOUNTER — Encounter (HOSPITAL_COMMUNITY)
Admission: RE | Disposition: A | Discharge status: Home / Self Care | Payer: Self-pay | Source: Ambulatory Visit | Attending: Urology

## 2023-03-05 ENCOUNTER — Ambulatory Visit (HOSPITAL_COMMUNITY)
Admission: RE | Admit: 2023-03-05 | Discharge: 2023-03-05 | Disposition: A | Discharge status: Home / Self Care | Payer: Medicare HMO | Source: Ambulatory Visit | Attending: Urology | Admitting: Urology

## 2023-03-05 ENCOUNTER — Ambulatory Visit (HOSPITAL_BASED_OUTPATIENT_CLINIC_OR_DEPARTMENT_OTHER): Payer: Medicare HMO | Admitting: Certified Registered"

## 2023-03-05 ENCOUNTER — Encounter (HOSPITAL_COMMUNITY): Payer: Self-pay | Admitting: Urology

## 2023-03-05 ENCOUNTER — Ambulatory Visit (HOSPITAL_COMMUNITY): Payer: Medicare HMO | Admitting: Physician Assistant

## 2023-03-05 DIAGNOSIS — F17210 Nicotine dependence, cigarettes, uncomplicated: Secondary | ICD-10-CM | POA: Insufficient documentation

## 2023-03-05 DIAGNOSIS — G709 Myoneural disorder, unspecified: Secondary | ICD-10-CM | POA: Insufficient documentation

## 2023-03-05 DIAGNOSIS — R634 Abnormal weight loss: Secondary | ICD-10-CM | POA: Diagnosis not present

## 2023-03-05 DIAGNOSIS — M1990 Unspecified osteoarthritis, unspecified site: Secondary | ICD-10-CM | POA: Diagnosis not present

## 2023-03-05 DIAGNOSIS — J449 Chronic obstructive pulmonary disease, unspecified: Secondary | ICD-10-CM | POA: Insufficient documentation

## 2023-03-05 DIAGNOSIS — Z681 Body mass index (BMI) 19 or less, adult: Secondary | ICD-10-CM | POA: Diagnosis not present

## 2023-03-05 DIAGNOSIS — N1330 Unspecified hydronephrosis: Secondary | ICD-10-CM | POA: Insufficient documentation

## 2023-03-05 DIAGNOSIS — D4121 Neoplasm of uncertain behavior of right ureter: Secondary | ICD-10-CM | POA: Diagnosis not present

## 2023-03-05 DIAGNOSIS — N810 Urethrocele: Secondary | ICD-10-CM | POA: Diagnosis not present

## 2023-03-05 DIAGNOSIS — D4120 Neoplasm of uncertain behavior of unspecified ureter: Secondary | ICD-10-CM

## 2023-03-05 DIAGNOSIS — C661 Malignant neoplasm of right ureter: Secondary | ICD-10-CM | POA: Diagnosis not present

## 2023-03-05 DIAGNOSIS — N1339 Other hydronephrosis: Secondary | ICD-10-CM | POA: Diagnosis not present

## 2023-03-05 DIAGNOSIS — D4959 Neoplasm of unspecified behavior of other genitourinary organ: Secondary | ICD-10-CM | POA: Diagnosis not present

## 2023-03-05 DIAGNOSIS — N952 Postmenopausal atrophic vaginitis: Secondary | ICD-10-CM | POA: Insufficient documentation

## 2023-03-05 HISTORY — PX: CYSTOSCOPY WITH RETROGRADE PYELOGRAM, URETEROSCOPY AND STENT PLACEMENT: SHX5789

## 2023-03-05 SURGERY — CYSTOURETEROSCOPY, WITH RETROGRADE PYELOGRAM AND STENT INSERTION
Anesthesia: General | Site: Ureter | Laterality: Bilateral

## 2023-03-05 MED ORDER — SODIUM CHLORIDE 0.9 % IR SOLN
Status: DC | PRN
  Administered 2023-03-05: 3000 mL via INTRAVESICAL

## 2023-03-05 MED ORDER — ORAL CARE MOUTH RINSE
15.0000 mL | Freq: Once | OROMUCOSAL | Status: AC
Start: 2023-03-05 — End: 2023-03-05

## 2023-03-05 MED ORDER — DROPERIDOL 2.5 MG/ML IJ SOLN
0.6250 mg | Freq: Once | INTRAMUSCULAR | Status: DC | PRN
Start: 2023-03-05 — End: 2023-03-05

## 2023-03-05 MED ORDER — ONDANSETRON HCL 4 MG/2ML IJ SOLN
INTRAMUSCULAR | Status: AC
  Filled 2023-03-05: qty 2

## 2023-03-05 MED ORDER — FENTANYL CITRATE (PF) 100 MCG/2ML IJ SOLN
INTRAMUSCULAR | Status: DC | PRN
  Administered 2023-03-05 (×3): 25 ug via INTRAVENOUS

## 2023-03-05 MED ORDER — PROPOFOL 10 MG/ML IV BOLUS
INTRAVENOUS | Status: AC
Start: 2023-03-05 — End: ?
  Filled 2023-03-05: qty 20

## 2023-03-05 MED ORDER — DEXAMETHASONE SODIUM PHOSPHATE 10 MG/ML IJ SOLN
INTRAMUSCULAR | Status: AC
Start: 2023-03-05 — End: ?
  Filled 2023-03-05: qty 1

## 2023-03-05 MED ORDER — SODIUM CHLORIDE 0.9 % IV SOLN
2.0000 g | INTRAVENOUS | Status: AC
  Administered 2023-03-05: 2 g via INTRAVENOUS
  Filled 2023-03-05: qty 20

## 2023-03-05 MED ORDER — FENTANYL CITRATE PF 50 MCG/ML IJ SOSY: 25.0000 ug | PREFILLED_SYRINGE | INTRAMUSCULAR | Status: DC | PRN

## 2023-03-05 MED ORDER — PROPOFOL 1000 MG/100ML IV EMUL
INTRAVENOUS | Status: AC
  Filled 2023-03-05: qty 100

## 2023-03-05 MED ORDER — LACTATED RINGERS IV SOLN: INTRAVENOUS | Status: DC

## 2023-03-05 MED ORDER — ACETAMINOPHEN 160 MG/5ML PO SOLN: 325.0000 mg | ORAL | Status: DC | PRN

## 2023-03-05 MED ORDER — PROPOFOL 10 MG/ML IV BOLUS
INTRAVENOUS | Status: AC
  Filled 2023-03-05: qty 20

## 2023-03-05 MED ORDER — LIDOCAINE HCL (PF) 2 % IJ SOLN
INTRAMUSCULAR | Status: AC
  Filled 2023-03-05: qty 5

## 2023-03-05 MED ORDER — PROPOFOL 10 MG/ML IV BOLUS
INTRAVENOUS | Status: DC | PRN
  Administered 2023-03-05: 140 mg via INTRAVENOUS

## 2023-03-05 MED ORDER — FENTANYL CITRATE (PF) 100 MCG/2ML IJ SOLN
INTRAMUSCULAR | Status: AC
  Filled 2023-03-05: qty 2

## 2023-03-05 MED ORDER — ONDANSETRON HCL 4 MG/2ML IJ SOLN
INTRAMUSCULAR | Status: DC | PRN
  Administered 2023-03-05: 4 mg via INTRAVENOUS

## 2023-03-05 MED ORDER — EPHEDRINE SULFATE (PRESSORS) 50 MG/ML IJ SOLN
INTRAMUSCULAR | Status: DC | PRN
  Administered 2023-03-05 (×2): 5 mg via INTRAVENOUS
  Administered 2023-03-05: 10 mg via INTRAVENOUS
  Administered 2023-03-05: 5 mg via INTRAVENOUS

## 2023-03-05 MED ORDER — ACETAMINOPHEN 10 MG/ML IV SOLN: 1000.0000 mg | Freq: Once | INTRAVENOUS | Status: DC | PRN

## 2023-03-05 MED ORDER — ACETAMINOPHEN 500 MG PO TABS
1000.0000 mg | ORAL_TABLET | Freq: Once | ORAL | Status: AC
  Administered 2023-03-05: 1000 mg via ORAL
  Filled 2023-03-05: qty 2

## 2023-03-05 MED ORDER — OXYCODONE HCL 5 MG PO TABS: 5.0000 mg | ORAL_TABLET | Freq: Once | ORAL | Status: DC | PRN

## 2023-03-05 MED ORDER — DEXAMETHASONE SODIUM PHOSPHATE 10 MG/ML IJ SOLN
INTRAMUSCULAR | Status: DC | PRN
  Administered 2023-03-05: 4 mg via INTRAVENOUS

## 2023-03-05 MED ORDER — OXYCODONE HCL 5 MG/5ML PO SOLN: 5.0000 mg | Freq: Once | ORAL | Status: DC | PRN

## 2023-03-05 MED ORDER — ACETAMINOPHEN 325 MG PO TABS: 325.0000 mg | ORAL_TABLET | ORAL | Status: DC | PRN

## 2023-03-05 MED ORDER — PHENYLEPHRINE 80 MCG/ML (10ML) SYRINGE FOR IV PUSH (FOR BLOOD PRESSURE SUPPORT)
PREFILLED_SYRINGE | INTRAVENOUS | Status: DC | PRN
  Administered 2023-03-05 (×3): 80 ug via INTRAVENOUS
  Administered 2023-03-05 (×3): 40 ug via INTRAVENOUS

## 2023-03-05 MED ORDER — CHLORHEXIDINE GLUCONATE 0.12 % MT SOLN
15.0000 mL | Freq: Once | OROMUCOSAL | Status: AC
  Administered 2023-03-05: 15 mL via OROMUCOSAL

## 2023-03-05 MED ORDER — LIDOCAINE 2% (20 MG/ML) 5 ML SYRINGE
INTRAMUSCULAR | Status: DC | PRN
  Administered 2023-03-05: 40 mg via INTRAVENOUS

## 2023-03-05 MED ORDER — EPHEDRINE SULFATE-NACL 50-0.9 MG/10ML-% IV SOSY: PREFILLED_SYRINGE | INTRAVENOUS | Status: DC | PRN

## 2023-03-05 SURGICAL SUPPLY — 23 items
BAG URINE DRAIN 2000ML AR STRL (UROLOGICAL SUPPLIES) IMPLANT
BAG URO CATCHER STRL LF (MISCELLANEOUS) ×3 IMPLANT
BASKET ZERO TIP NITINOL 2.4FR (BASKET) ×1 IMPLANT
CATH URETL OPEN 5X70 (CATHETERS) ×1 IMPLANT
CATH URETL OPEN END 6FR 70 (CATHETERS) ×3 IMPLANT
CLOTH BEACON ORANGE TIMEOUT ST (SAFETY) ×3 IMPLANT
DRAPE FOOT SWITCH (DRAPES) ×3 IMPLANT
FIBER LASER MOSES 200 DFL (Laser) IMPLANT
FIBER LASER MOSES 365 DFL (Laser) IMPLANT
GLOVE BIO SURGEON STRL SZ7.5 (GLOVE) ×3 IMPLANT
GLOVE SURG LX STRL 7.5 STRW (GLOVE) ×3 IMPLANT
GOWN STRL REUS W/ TWL XL LVL3 (GOWN DISPOSABLE) ×3 IMPLANT
GUIDEWIRE STR DUAL SENSOR (WIRE) ×3 IMPLANT
GUIDEWIRE ZIPWRE .038 STRAIGHT (WIRE) IMPLANT
KIT TURNOVER KIT A (KITS) IMPLANT
LOOP CUT BIPOLAR 24F LRG (ELECTROSURGICAL) IMPLANT
MANIFOLD NEPTUNE II (INSTRUMENTS) ×3 IMPLANT
PACK CYSTO (CUSTOM PROCEDURE TRAY) ×3 IMPLANT
SHEATH NAVIGATOR HD 11/13X28 (SHEATH) IMPLANT
SHEATH NAVIGATOR HD 11/13X36 (SHEATH) IMPLANT
STENT URET 6FRX22 CONTOUR (STENTS) ×1 IMPLANT
TUBING CONNECTING 10 (TUBING) ×3 IMPLANT
TUBING UROLOGY SET (TUBING) ×3 IMPLANT

## 2023-03-05 NOTE — Transfer of Care (Signed)
Immediate Anesthesia Transfer of Care Note  Patient: Karen Church  Procedure(s) Performed: CYSTOSCOPY WITH BILATERAL RETROGRADE PYELOGRAM, RIGHT URETEROSCOPY WITH BIOPSY AND STENT PLACEMENT (Bilateral: Ureter)  Patient Location: PACU  Anesthesia Type:General  Level of Consciousness: awake, alert , and patient cooperative  Airway & Oxygen Therapy: Patient Spontanous Breathing and Patient connected to face mask oxygen  Post-op Assessment: Report given to RN and Post -op Vital signs reviewed and stable  Post vital signs: Reviewed and stable  Last Vitals:  Vitals Value Taken Time  BP 133/56 03/05/23 0904  Temp    Pulse 86 03/05/23 0905  Resp 23 03/05/23 0905  SpO2 100 % 03/05/23 0905  Vitals shown include unfiled device data.  Last Pain:  Vitals:   03/05/23 0609  TempSrc:   PainSc: 0-No pain         Complications: No notable events documented.

## 2023-03-05 NOTE — Anesthesia Postprocedure Evaluation (Signed)
Anesthesia Post Note  Patient: Karen Church  Procedure(s) Performed: CYSTOSCOPY WITH BILATERAL RETROGRADE PYELOGRAM, RIGHT URETEROSCOPY WITH BIOPSY AND STENT PLACEMENT (Bilateral: Ureter)     Patient location during evaluation: PACU Anesthesia Type: General Level of consciousness: awake and alert Pain management: pain level controlled Vital Signs Assessment: post-procedure vital signs reviewed and stable Respiratory status: spontaneous breathing, nonlabored ventilation, respiratory function stable and patient connected to nasal cannula oxygen Cardiovascular status: blood pressure returned to baseline and stable Postop Assessment: no apparent nausea or vomiting Anesthetic complications: no  No notable events documented.  Last Vitals:  Vitals:   03/05/23 0930 03/05/23 0956  BP: (!) 129/58 (!) 140/67  Pulse: 88 88  Resp: 19   Temp:    SpO2: 91% 90%    Last Pain:  Vitals:   03/05/23 0956  TempSrc:   PainSc: 0-No pain                 Shelton Silvas

## 2023-03-05 NOTE — Anesthesia Procedure Notes (Signed)
Procedure Name: LMA Insertion Date/Time: 03/05/2023 7:44 AM  Performed by: Sindy Guadeloupe, CRNAPre-anesthesia Checklist: Patient identified, Emergency Drugs available, Suction available, Patient being monitored and Timeout performed Patient Re-evaluated:Patient Re-evaluated prior to induction Oxygen Delivery Method: Circle system utilized Preoxygenation: Pre-oxygenation with 100% oxygen Induction Type: IV induction Ventilation: Mask ventilation without difficulty LMA: LMA with gastric port inserted LMA Size: 3.0 Number of attempts: 1 Placement Confirmation: positive ETCO2 Tube secured with: Tape Dental Injury: Teeth and Oropharynx as per pre-operative assessment
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
## 2023-03-05 NOTE — Op Note (Signed)
Preoperative diagnosis: Right hydronephrosis, right ureteral neoplasm Postoperative diagnosis: Same  Procedure: Cystoscopy bilateral retrograde pyelogram, right ureteroscopy with biopsy and right ureteral stent placement  Surgeon: Mena Goes  Anesthesia: General  Indication for procedure: Karen Church is a 73 year old female with a history of weight loss.  She underwent a CT scan of the abdomen and pelvis September 2024 which revealed a right proximal ureteral enhancement and likely tumor as well as right hydronephrosis.  She brought today for definitive evaluation.  She had delayed imaging on the left but no imaging of the distal ureter so we will complete that evaluation as well.  Findings: On exam there was moderate vaginal atrophy but the vulva and introitus appear normal without lesion.  Small urethrocele.  Bladder and urethra are palpably normal.  On cystoscopy the urethra and the bladder were unremarkable.  No stone or foreign body in the bladder.  No mucosal lesions.  Clear E flux bilaterally.  Trigone and ureteral orifice ease in their normal orthotopic position.  Left retrograde pyelogram-this outlined a single ureter single collecting system unit without filling defect, stricture or dilation.  Despite irrigating the catheter there was some air in the system but these bubbles went up the ureter into the renal pelvis and then came back down E flux.  Spot imaging was obtained and noted to be normal.  Right retrograde pyelogram-this outlined a single ureter single collecting system unit.  There is a small filling defect immediately in the mid ureter consistent with papillary tumor.  And then some slight tortuosity with a larger filling defect in the proximal ureter consistent with papillary tumor and dilation of the collecting system and renal pelvis.  On right ureteroscopy there was some papillary tumor small area about a centimeter in the right mid ureter and then in the proximal ureter about a 4  cm segment of papillary tumor.  This was biopsied.  I did not perform renoscopy.  Description of procedure: After consent was obtained patient brought to the operating room.  After adequate anesthesia she is placed lithotomy position prepped and draped in the usual sterile fashion.  Timeout was performed to confirm the patient and procedure.  Exam under anesthesia was performed.  Cystoscope passed per urethra and the bladder carefully inspected with a 30 and 70 degree lens.  The left ureteral orifice was cannulated with 6 [place] open-ended catheter and left retrograde injection of contrast was performed.  Spot imaging obtained to watch contrast proximally and then distally on E flux.  I could not quite cannulate the right ureteral orifice with a 6 [place] switched out to a 5 [place] open-ended catheter and the ureter was intubated and retrograde injection of contrast was performed.  I then passed a sensor wire and coiled that up in the collecting system.  I then passed a single channel semirigid scope but the tumor was located.  We used a 0 tip basket to try to get a biopsy.  We were unsuccessful so I switched out to a dual channel semirigid and this had better visualization.  We got to the base of some of the papillary tumor in the proximal ureter and did 2 or 3 biopsies and collected those.  The wire was then backloaded on the cystoscope and a 622 cm stent was advanced.  The wire was removed with a good coil seen up in the kidney and a good coil in the bladder.  She was awakened taken the cover room in stable condition.  Complications: None  Blood loss:  Minimal  Specimens to pathology: Right ureteral biopsies  Drains: 6 x 22 cm right ureteral stent  Disposition: Patient stable to PACU.

## 2023-03-06 ENCOUNTER — Encounter (HOSPITAL_COMMUNITY): Payer: Self-pay | Admitting: Urology

## 2023-03-07 IMAGING — DX DG CHEST 2V
2 series · 2 of 2 positions shown · non-contrast
Comparison: Chest x-ray 03/31/2021

CLINICAL DATA: Shortness of breath.

EXAM:
CHEST - 2 VIEW

[chest pa]
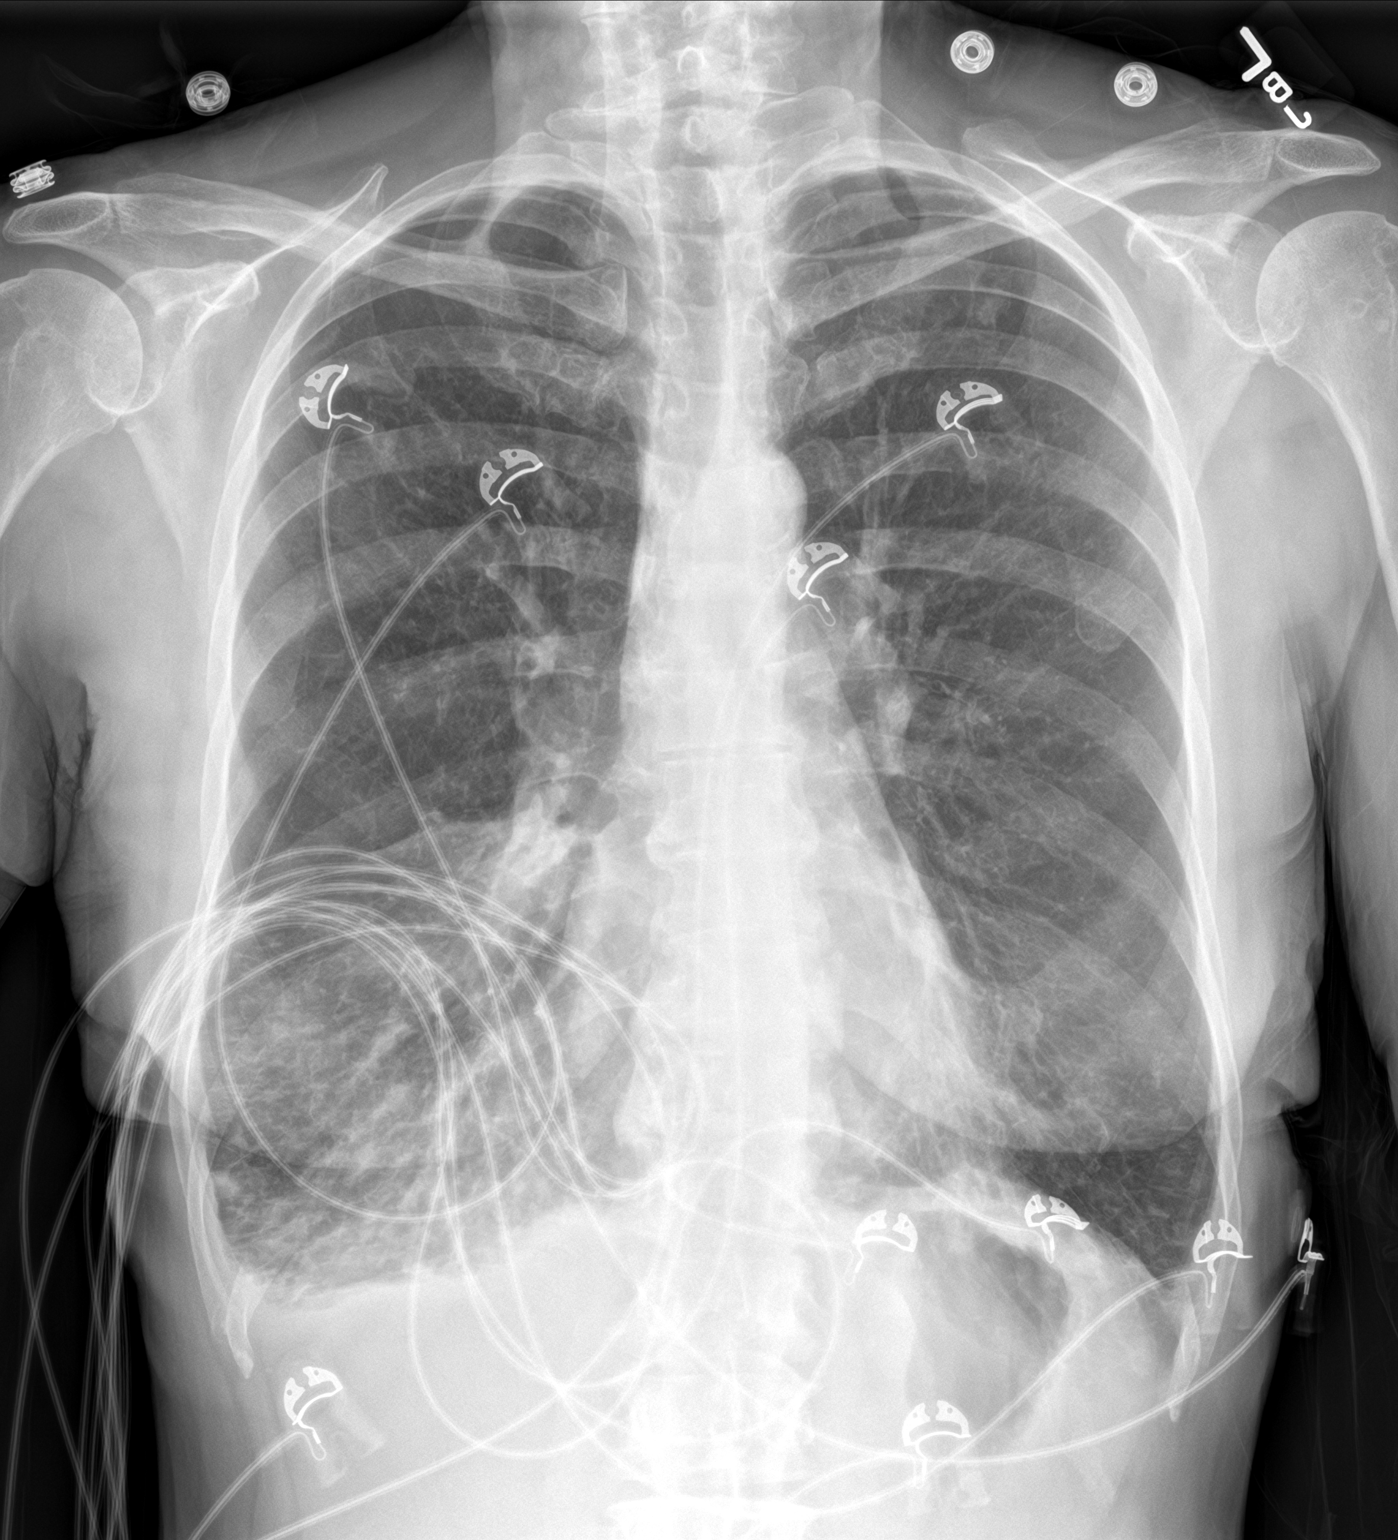

[chest lat]
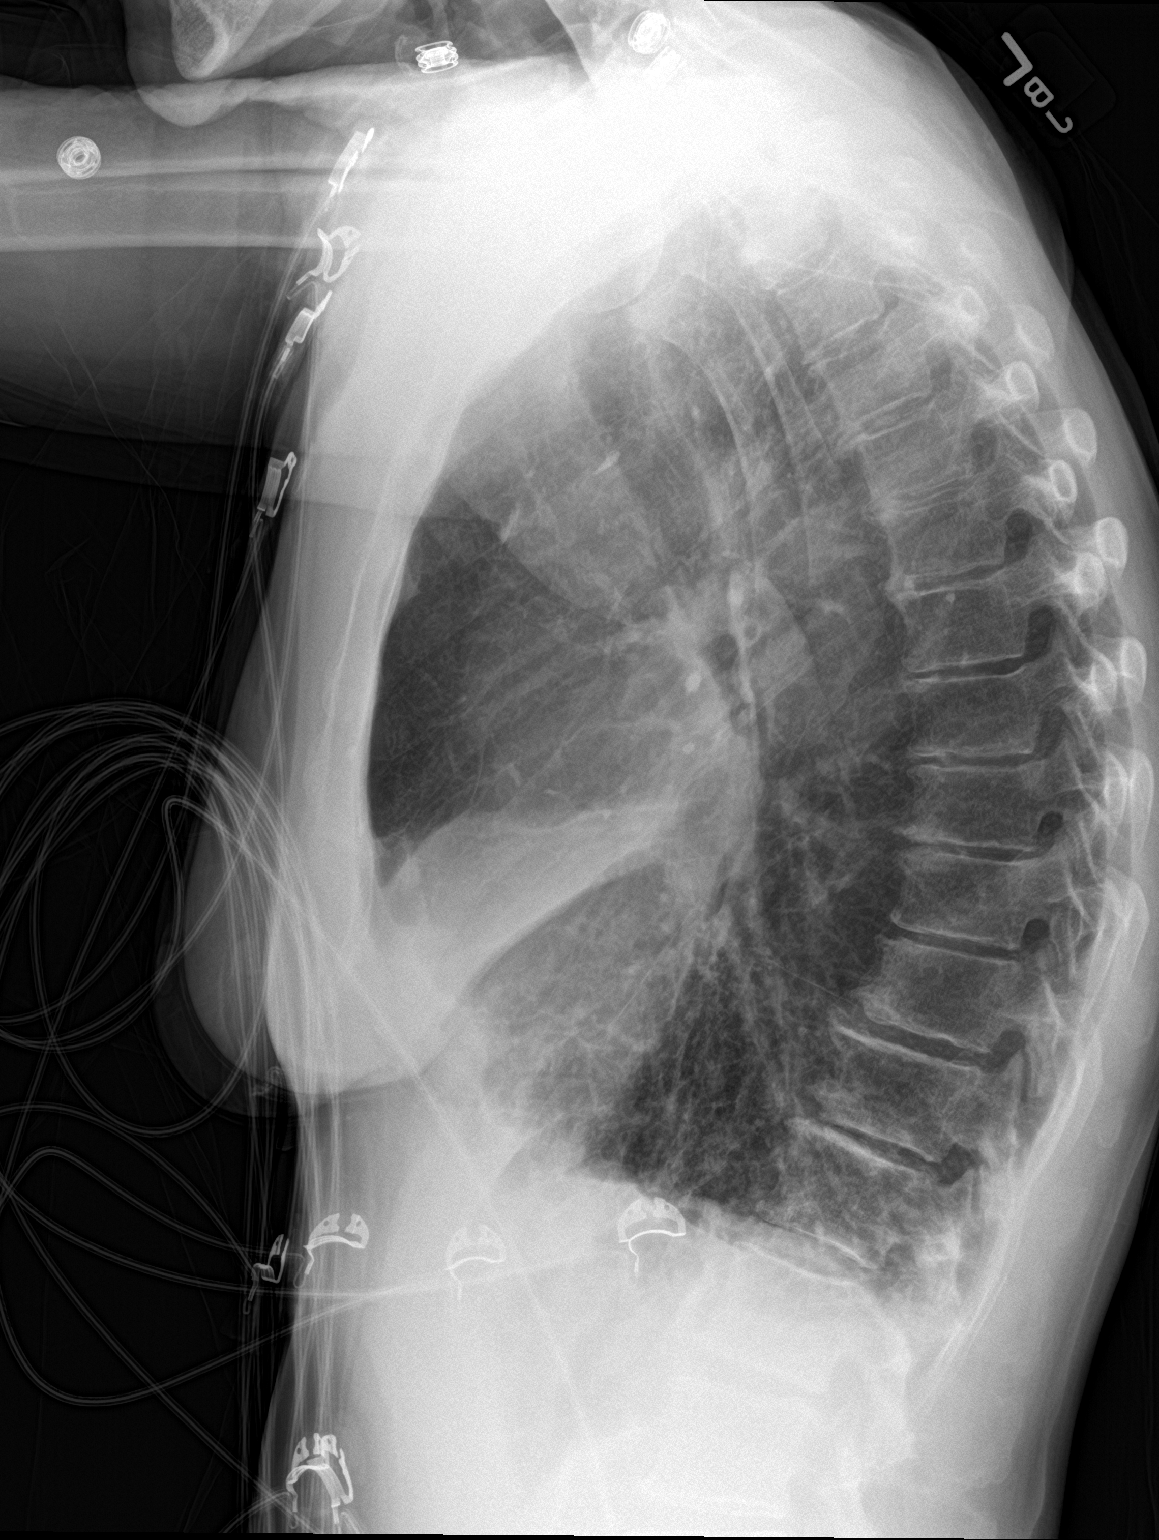

[2 of 2 positions shown; findings below may reference images not displayed]

FINDINGS: There are new patchy airspace opacities throughout the right middle
lobe. There is a small right pleural effusion. Lungs are
hyperinflated, unchanged. Cardiomediastinal silhouette is within
normal limits. No pneumothorax. No acute fractures.
IMPRESSION: 1. New right middle lobe airspace disease worrisome for pneumonia.
Recommend follow-up chest x-ray in 4-6 weeks to confirm resolution.
2. New small right pleural effusion.

## 2023-03-08 LAB — SURGICAL PATHOLOGY

## 2023-03-09 ENCOUNTER — Ambulatory Visit: Payer: Medicare HMO | Attending: Internal Medicine | Admitting: Internal Medicine

## 2023-03-09 ENCOUNTER — Encounter: Payer: Self-pay | Admitting: Internal Medicine

## 2023-03-09 VITALS — BP 132/72 | HR 86 | Ht 62.0 in | Wt 105.8 lb

## 2023-03-09 DIAGNOSIS — I25118 Atherosclerotic heart disease of native coronary artery with other forms of angina pectoris: Secondary | ICD-10-CM

## 2023-03-09 DIAGNOSIS — E7849 Other hyperlipidemia: Secondary | ICD-10-CM

## 2023-03-09 DIAGNOSIS — E785 Hyperlipidemia, unspecified: Secondary | ICD-10-CM | POA: Insufficient documentation

## 2023-03-09 DIAGNOSIS — I2510 Atherosclerotic heart disease of native coronary artery without angina pectoris: Secondary | ICD-10-CM | POA: Insufficient documentation

## 2023-03-09 MED ORDER — ROSUVASTATIN CALCIUM 10 MG PO TABS: 10.0000 mg | ORAL_TABLET | Freq: Every day | ORAL | 2 refills | Status: AC

## 2023-03-09 NOTE — Progress Notes (Addendum)
 Cardiology Office Note  Date: 03/09/2023   ID: Karen, Church 01/18/1951, MRN [id number]  PCP:  Benita Stabile, MD  Cardiologist:  Marjo Bicker, MD Electrophysiologist:  None   History of Present Illness: Karen Church is a 73 y.o. female known to have moderate to severe COPD, nicotine abuse is here for follow-up visit of chest tightness.  CT cardiac showed coronary calcium score of 557 (91st percentile for age and sex matched control).  Moderate CAD is noted in the RCA, CT FFR did not show any significant stenosis however in the RCA, there was a decrease in the FFR between successive stenosis (based on delta, not significant). Aggressive medical management was recommended.  Echocardiogram showed normal LVEF and no valvular heart disease.  No interval exertional chest pain.  She does have chest tightness with anxiety but none with physical activity the last 3 months.  No other symptoms of DOE, dizziness, syncope, palpitations, leg swelling.  Overall doing great.  Smokes 10 cigarettes daily.  She reported undergoing ureteral stent recently, last week and was told she will have blood in her urine for at least 1 week after the procedure.  She was taken off all the current medications until her urine clears.   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   Dyspnea    Hydronephrosis of right kidney    Melanoma (HCC)    R arm   Pneumonia     Past Surgical History:  Procedure Laterality Date   ABDOMINAL HYSTERECTOMY     BALLOON DILATION N/A 08/11/2021   Procedure: BALLOON DILATION;  Surgeon: Lanelle Bal, DO;  [facility];  Service: Endoscopy;   Laterality: N/A;   BIOPSY  12/10/2015   Procedure: BIOPSY;  Surgeon: Corbin Ade, MD;  [facility];  Service: Endoscopy;;  Gastric    BIOPSY  08/11/2021   Procedure: BIOPSY;  Surgeon: Lanelle Bal, DO;  [facility];  Service: Endoscopy;;  gastric    CESAREAN SECTION     COLONOSCOPY N/A 12/10/2015   normal   CYSTOSCOPY WITH RETROGRADE PYELOGRAM, URETEROSCOPY AND STENT PLACEMENT Bilateral 03/05/2023   Procedure: CYSTOSCOPY WITH BILATERAL RETROGRADE PYELOGRAM, RIGHT URETEROSCOPY WITH BIOPSY AND STENT PLACEMENT;  Surgeon: [name], MD;  Location: [facility];  Service: Urology;  Laterality: Bilateral;   ESOPHAGOGASTRODUODENOSCOPY N/A 12/10/2015   normal esophagus s/p dilation, +H.pylori   ESOPHAGOGASTRODUODENOSCOPY (EGD) WITH PROPOFOL N/A 08/11/2021   Procedure: ESOPHAGOGASTRODUODENOSCOPY (EGD) WITH PROPOFOL;  Surgeon: Lanelle Bal, DO;  [facility];  Service: Endoscopy;  Laterality: N/A;  2:15pm   MALONEY DILATION N/A 12/10/2015   Procedure: [name] DILATION;  Surgeon: Corbin Ade, MD;  [facility];  Service: Endoscopy;  Laterality: N/A;   SKIN CANCER EXCISION     SMALL INTESTINE SURGERY N/A 1967   Resection    Current Outpatient Medications  Medication Sig Dispense Refill   albuterol (PROVENTIL) (2.5 MG/3ML) 0.083% nebulizer solution Take 2.5 mg by nebulization every 6 (six) hours as needed for wheezing or shortness of breath.     albuterol (  VENTOLIN HFA) 108 (90 Base) MCG/ACT inhaler Inhale 2 puffs into the lungs every 4 (four) hours as needed for wheezing or shortness of breath. (Patient not taking: Reported on 02/17/2023) 8 g 2   aspirin EC 81 MG tablet Take 81 mg by mouth daily.      Budeson-Glycopyrrol-Formoterol (BREZTRI AEROSPHERE) 160-9-4.8 MCG/ACT AERO Inhale 2 puffs into the lungs 2 (two) times daily. Take 2 puffs first thing in am and then another 2 puffs about 12 hours later. (Patient taking differently: Inhale 2  puffs into the lungs daily as needed (COPD).) 10.7 g 11   cholecalciferol (VITAMIN D3) 25 MCG (1000 UNIT) tablet Take 1,000 Units by mouth daily.     Multiple Vitamin (MULTIVITAMIN WITH MINERALS) TABS Take 1 tablet by mouth daily.     nitroGLYCERIN (NITROSTAT) 0.4 MG SL tablet Place 1 tablet (0.4 mg total) under the tongue every 5 (five) minutes x 3 doses as needed for chest pain (If no relief after 3rd dose proceed to ED or call 911). 25 tablet 2   Omega 3 1000 MG CAPS Take 1,000 mg by mouth daily.     No current facility-administered medications for this visit.   Allergies:  Lasix [furosemide]   Social History: The patient  reports that she has been smoking cigarettes. She has a 25 pack-year smoking history. She has been exposed to tobacco smoke. She has never used smokeless tobacco. She reports that she does not drink alcohol and does not use drugs.   Family History: The patient's family history includes Colon polyps in her brother and sister.   ROS:  Please see the history of present illness. Otherwise, complete review of systems is positive for none  All other systems are reviewed and negative.   Physical Exam: VS:  There were no vitals taken for this visit., BMI There is no height or weight on file to calculate BMI.  Wt Readings from Last 3 Encounters:  03/05/23 103 lb 9.6 oz (47 kg)  02/23/23 103 lb 9.6 oz (47 kg)  01/01/23 106 lb 9.6 oz (48.4 kg)    General: Patient appears comfortable at rest. HEENT: Conjunctiva and lids normal, oropharynx clear with moist mucosa. Neck: Supple, no elevated JVP or carotid bruits, no thyromegaly. Lungs: Clear to auscultation, nonlabored breathing at rest. Cardiac: Regular rate and rhythm, no S3 or significant systolic murmur, no pericardial rub. Abdomen: Soft, nontender, no hepatomegaly, bowel sounds present, no guarding or rebound. Extremities: No pitting edema, distal pulses 2+. Skin: Warm and dry. Musculoskeletal: No  kyphosis. Neuropsychiatric: Alert and oriented x3, affect grossly appropriate.  Recent Labwork: 02/23/2023: BUN 13; Creatinine, Ser 0.73; Potassium 4.1; Sodium 138  No results found for: "CHOL", "TRIG", "HDL", "CHOLHDL", "VLDL", "LDLCALC", "LDLDIRECT"   Assessment and Plan:  Chest tightness: No interval angina.  She does have chest tightness with anxiety.  CT cardiac showed coronary calcium score of 557 (91st percentile for age and sex matched control).  Moderate CAD is noted in the RCA, CT FFR did not show any significant stenosis however in the RCA, there was a decrease in the FFR between successive stenosis (based on delta, not significant). Aggressive medical management was recommended.  Currently she is off all medications after she had urology surgery last week.  Instructed patient to resume all medications when she gets okay from her urologist.  Start rosuvastatin 10 mg nightly when she restarts other medications.  HLD, not at goal: Lipid panel from August 2024 reviewed, LDL 151, elevated and TG 86,  normal.  Start rosuvastatin 10 mg nightly.  Goal LDL less than 70.  Nicotine abuse: Smokes half a pack a day, smoking cessation counseling provided.  Moderate to severe COPD:  Follows with pulmonology, on inhalers.    Addendum on 04/12/2023 Preop cardiac risk stratification for ROBOTIC NEPHROURETERECTOMY WITH CYSTOCSOPY  -No interval angina per evaluation from 03/09/2023. She has chest pains from anxiety but none with exertion. Non-cardiac chest pain. Low risk for any perioperative cardiac complications. No further cardiac work up is needed prior to the surgery (Robotic nephroureterectomy with cystoscopy). Okay to hold aspirin for 5 days prior to the procedure and resume when okay from bleeding standpoint.    Medication Adjustments/Labs and Tests Ordered: Current medicines are reviewed at length with the patient today.  Concerns regarding medicines are outlined above.    Disposition:   Follow up  6 months  Signed Aalyiah Camberos Verne Spurr, MD, 03/09/2023 11:07 AM    [facility] at [facility] [address] [place], [place], [place] [address]

## 2023-03-09 NOTE — Patient Instructions (Signed)
Medication Instructions:  Your physician has recommended you make the following change in your medication:  Start taking Rosuvastatin 10 mg daily at bed time Continue taking any other medications as prescribed  Labwork: None  Testing/Procedures: None  Follow-Up: Your physician recommends that you schedule a follow-up appointment in: 6 months  Any Other Special Instructions Will Be Listed Below (If Applicable). Thank you for choosing [facility]!      If you need a refill on your cardiac medications before your next appointment, please call your pharmacy.

## 2023-03-15 ENCOUNTER — Ambulatory Visit (INDEPENDENT_AMBULATORY_CARE_PROVIDER_SITE_OTHER): Payer: Medicare HMO | Admitting: Urology

## 2023-03-15 VITALS — BP 164/81 | HR 109

## 2023-03-15 DIAGNOSIS — C661 Malignant neoplasm of right ureter: Secondary | ICD-10-CM

## 2023-03-15 DIAGNOSIS — N1330 Unspecified hydronephrosis: Secondary | ICD-10-CM | POA: Diagnosis not present

## 2023-03-15 LAB — URINALYSIS, ROUTINE W REFLEX MICROSCOPIC
Bilirubin, UA: NEGATIVE
Glucose, UA: NEGATIVE
Ketones, UA: NEGATIVE
Nitrite, UA: NEGATIVE
Specific Gravity, UA: 1.015 (ref 1.005–1.030)
Urobilinogen, Ur: 0.2 mg/dL (ref 0.2–1.0)
pH, UA: 6 (ref 5.0–7.5)

## 2023-03-15 LAB — MICROSCOPIC EXAMINATION
Bacteria, UA: NONE SEEN
RBC, Urine: >30 /[HPF] — AB (ref 0–2)
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
## 2023-03-19 ENCOUNTER — Telehealth: Payer: Self-pay | Admitting: Urology

## 2023-03-19 NOTE — Telephone Encounter (Signed)
 Patient states that she had spoke with a nurse in [place] and her questions were addressed.  I advised her to monitor her temperature and her output and to follow up as scheduled.  Patient voiced understanding.

## 2023-03-19 NOTE — Telephone Encounter (Signed)
 2 weeks postop kidney stent. Blood in urine, and burning during urination.

## 2023-03-31 ENCOUNTER — Ambulatory Visit: Payer: Medicare HMO | Admitting: Urology

## 2023-04-02 ENCOUNTER — Encounter: Payer: Self-pay | Admitting: Urology

## 2023-04-02 ENCOUNTER — Ambulatory Visit: Payer: Medicare HMO | Admitting: Urology

## 2023-04-02 VITALS — BP 153/78 | HR 99

## 2023-04-02 DIAGNOSIS — C661 Malignant neoplasm of right ureter: Secondary | ICD-10-CM | POA: Diagnosis not present

## 2023-04-02 DIAGNOSIS — R300 Dysuria: Secondary | ICD-10-CM | POA: Diagnosis not present

## 2023-04-02 LAB — POCT URINALYSIS DIPSTICK
Bilirubin, UA: NEGATIVE
Glucose, UA: NEGATIVE — AB
Ketones, UA: NEGATIVE
Nitrite, UA: NEGATIVE
Protein, UA: POSITIVE — AB
Spec Grav, UA: 1.025 (ref 1.010–1.025)
Urobilinogen, UA: 0.2 U/dL
pH, UA: 7 (ref 5.0–8.0)

## 2023-04-02 NOTE — Progress Notes (Signed)
04/02/2023 3:06 PM   Karen Church [birth year] [id number]  Referring provider: Benita Stabile, MD [address] [place],  [place] [address]  No chief complaint on file.   HPI: Ms Hunger is a 73yo here for evaluation of right ureteral TCC. She underwent ureteral biopsy for a 4cm proximal ureteral tumor which came back low grade TCC. She has a 40 pk year smoking hx. She is having intermittent gross hematuria with the right ureteral stent in place. She has a bowel resection when she was 15.    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   Dyspnea    Hydronephrosis of right kidney    Melanoma (HCC)    R arm   Pneumonia     Surgical History: Past Surgical History:  Procedure Laterality Date   ABDOMINAL HYSTERECTOMY     BALLOON DILATION N/A 08/11/2021   Procedure: BALLOON DILATION;  Surgeon: Lanelle Bal, DO;  [facility];  Service: Endoscopy;  Laterality: N/A;   BIOPSY  12/10/2015   Procedure: BIOPSY;  Surgeon: Corbin Ade, MD;  [facility];  Service: Endoscopy;;  Gastric    BIOPSY  08/11/2021   Procedure: BIOPSY;  Surgeon: Lanelle Bal, DO;  [facility];  Service: Endoscopy;;  gastric    CESAREAN SECTION     COLONOSCOPY N/A 12/10/2015   normal   CYSTOSCOPY WITH RETROGRADE PYELOGRAM, URETEROSCOPY AND STENT PLACEMENT Bilateral 03/05/2023   Procedure: CYSTOSCOPY WITH BILATERAL RETROGRADE PYELOGRAM, RIGHT URETEROSCOPY WITH BIOPSY AND STENT PLACEMENT;  Surgeon: [name], MD;  [facility];  Service: Urology;  Laterality: Bilateral;   ESOPHAGOGASTRODUODENOSCOPY N/A 12/10/2015   normal esophagus  s/p dilation, +H.pylori   ESOPHAGOGASTRODUODENOSCOPY (EGD) WITH PROPOFOL N/A 08/11/2021   Procedure: ESOPHAGOGASTRODUODENOSCOPY (EGD) WITH PROPOFOL;  Surgeon: Lanelle Bal, DO;  [facility];  Service: Endoscopy;  Laterality: N/A;  2:15pm   MALONEY DILATION N/A 12/10/2015   Procedure: [name] DILATION;  Surgeon: Corbin Ade, MD;  [facility];  Service: Endoscopy;  Laterality: N/A;   SKIN CANCER EXCISION     SMALL INTESTINE SURGERY N/A 1967   Resection    Home Medications:  Allergies as of 04/02/2023       Reactions   Lasix [furosemide] Swelling        Medication List        Accurate as of April 02, 2023  3:06 PM. If you have any questions, ask your nurse or doctor.          albuterol (2.5 MG/3ML) 0.083% nebulizer solution Commonly known as: PROVENTIL Take 2.5 mg by nebulization every 6 (six) hours as needed for wheezing or shortness of breath.   albuterol 108 (90 Base) MCG/ACT inhaler Commonly known as: VENTOLIN HFA Inhale 2 puffs into the lungs every 4 (four) hours as needed for wheezing or shortness of breath.   aspirin EC 81 MG tablet Take 81 mg by mouth daily.   Breztri Aerosphere 160-9-4.8 MCG/ACT Aero Generic drug: Budeson-Glycopyrrol-Formoterol Inhale 2 puffs into the lungs 2 (two) times daily. Take 2 puffs  first thing in am and then another 2 puffs about 12 hours later.   cholecalciferol 25 MCG (1000 UNIT) tablet Commonly known as: VITAMIN D3 Take 1,000 Units by mouth daily.   multivitamin with minerals Tabs tablet Take 1 tablet by mouth daily.   nitroGLYCERIN 0.4 MG SL tablet Commonly known as: Nitrostat Place 1 tablet (0.4 mg total) under the tongue every 5 (five) minutes x 3 doses as needed for chest pain (If no relief after 3rd dose proceed to ED or call 911).   Omega 3 1000 MG Caps Take 1,000 mg by mouth daily.   rosuvastatin 10 MG tablet Commonly known as: CRESTOR Take 1 tablet (10 mg total) by mouth daily.         Allergies:  Allergies  Allergen Reactions   Lasix [Furosemide] Swelling    Family History: Family History  Problem Relation Age of Onset   Colon polyps Sister    Colon polyps Brother    Colon cancer Neg Hx     Social History:  reports that she has been smoking cigarettes. She has a 25 pack-year smoking history. She has been exposed to tobacco smoke. She has never used smokeless tobacco. She reports that she does not drink alcohol and does not use drugs.  ROS: All other review of systems were reviewed and are negative except what is noted above in HPI  Physical Exam: BP (!) 153/78   Pulse 99   Constitutional:  Alert and oriented, No acute distress. HEENT: [place] AT, moist mucus membranes.  Trachea midline, no masses. Cardiovascular: No clubbing, cyanosis, or edema. Respiratory: Normal respiratory effort, no increased work of breathing. GI: Abdomen is soft, nontender, nondistended, no abdominal masses GU: No CVA tenderness.  Lymph: No cervical or inguinal lymphadenopathy. Skin: No rashes, bruises or suspicious lesions. Neurologic: Grossly intact, no focal deficits, moving all 4 extremities. Psychiatric: Normal mood and affect.  Laboratory Data: Lab Results  Component Value Date   WBC 10.7 (H) 10/29/2021   HGB 13.6 10/29/2021   HCT 40.6 10/29/2021   MCV 94.2 10/29/2021   PLT 157 10/29/2021    Lab Results  Component Value Date   CREATININE 0.73 02/23/2023    No results found for: "PSA"  No results found for: "TESTOSTERONE"  No results found for: "HGBA1C"  Urinalysis    Component Value Date/Time   APPEARANCEUR Clear 03/15/2023 1345   GLUCOSEU Negative 03/15/2023 1345   BILIRUBINUR Negative 03/15/2023 1345   PROTEINUR 1+ (A) 03/15/2023 1345   NITRITE Negative 03/15/2023 1345   LEUKOCYTESUR 1+ (A) 03/15/2023 1345    Lab Results  Component Value Date   LABMICR See below: 03/15/2023   WBCUA 6-10 (A) 03/15/2023   LABEPIT 0-10 03/15/2023   BACTERIA  None seen 03/15/2023    Pertinent Imaging: CT 10/2022: Images reviewed and discussed with the patient  No results found for this or any previous visit.  No results found for this or any previous visit.  No results found for this or any previous visit.  No results found for this or any previous visit.  No results found for this or any previous visit.  No results found for this or any previous visit.  No results found for this or any previous visit.  No results found for this or any previous visit.   Assessment & Plan:    1. Ureteral cancer, right (HCC) We discussed the natural hx of renal TCC. We disucssed the treatment options including active surveillance. Renal ablation,  and  radical nephroureterectomy. After discussing the options the patient elects for robotic right nephrourterectomy.  - Urinalysis, Routine w reflex microscopic     No follow-ups on file.  Wilkie Aye, MD  [facility] [place]

## 2023-04-02 NOTE — Patient Instructions (Signed)
Minimally Invasive Nephrectomy Minimally invasive nephrectomy is a surgery to remove a kidney. The body has two kidneys. They work as a filter to clean the blood and remove waste. The kidneys also remove extra fluid from the body by making pee (urine). This surgery may be done to: Remove the entire kidney, adrenal gland, and some structures around them. This is called a radical nephrectomy. Remove only the damaged or diseased part of the kidney. This is called a partial nephrectomy. You may need this surgery if: Your kidney is badly damaged from disease, infection, injury, or cancer. You were born with an abnormal kidney. You are donating a healthy kidney. Tell a health care provider about: Any allergies you have. All medicines you take. These include vitamins, herbs, eye drops, and creams. Any problems you or family members have had with anesthesia. Any bleeding problems you have. Any surgeries you've had. Any medical conditions you have. Whether you're pregnant or may be pregnant. What are the risks? Your health care provider will talk with you about risks. These may include: Bleeding. Infection. Damage to other body structures near the kidney. Pulmonary embolism. This is when a blood clot forms in the leg and moves to the lung. Allergic reactions to medicines. Kidney failure. Changing to an open procedure. This may happen if a bone, usually part of a rib, needs to be removed so the surgeon can get to the kidney. What happens before the surgery? When to stop eating and drinking Eat and drink only as you've been told. You may be told this: 8 hours before your surgery Stop eating most foods. Do not eat meat, fried foods, or fatty foods. Eat only light foods, such as toast or crackers. All liquids are okay except energy drinks and alcohol. 6 hours before your surgery Stop eating. Drink only clear liquids, such as water, clear fruit juice, black coffee, plain tea, and sports  drinks. Do not drink energy drinks or alcohol. 2 hours before your surgery Stop drinking all liquids. You may be allowed to take medicines with small sips of water. If you do not eat and drink as told, your surgery may be delayed or canceled. Medicines Ask about changing or stopping: Any medicines you take. Any vitamins, herbs, or supplements you take. Do not take aspirin or ibuprofen unless you're told to. Surgery safety For your safety, you may: Need to wash your skin with a soap that kills germs. Get antibiotics. Have your surgery site marked. Have hair removed at the surgery site. General instructions Do not smoke, vape, or use nicotine or tobacco for at least 4 weeks before the surgery. You might have your blood drawn and tested in case you need to receive blood, or get a transfusion, during surgery. What happens during the surgery?     An IV will be put into a vein in your hand or arm. You may be given: A sedative to help you relax. Anesthesia to keep you from feeling pain. A soft tube called a catheter will be placed in your bladder to drain pee. Several small cuts will be made in your belly. Special tools called trocars will be inserted through these cuts. Your belly will be filled with a gas. This gives the surgeon more room to work. A laparoscope will be put through one of the trocars. This is a thin, lighted tube with a camera on the end. The camera will send images to a screen in the operating room. Surgical tools will be put through  the other cuts. Sometimes, the surgeon may control these tools using a robot. This is called a robot-assisted nephrectomy. If you're having a partial nephrectomy: The blood vessels attached to your kidney will be clamped. Part of your kidney will be removed. The kidney will be closed with stitches or staples, and the clamp on the blood vessels will be removed. If you're having a radical nephrectomy: All of the blood vessels that attach  to your kidney will be tied off and separated from the kidney. Part of your ureter will be removed. The ureter is the tube that carries pee from your kidney to your bladder. A larger cut may be made in your lower belly to take out the kidney. A small tube (drain) may be placed near one of the cuts to drain extra fluid. The cuts will be closed with stitches, staples, skin glue, or tape strips. A bandage will be placed over the area. These steps may vary. Ask what you can expect. What happens after the surgery? You'll be watched closely until you leave. This includes checking your pain level, blood pressure, heart rate, and breathing rate. You may have: A urinary catheter. How much you pee will be checked. A drain. Your surgical drain will be removed after a few days. Pain medicine given through your IV. Compression stockings to reduce swelling and help prevent blood clots in your legs. You'll be told to: Get out of bed and walk as soon as you can. Do breathing exercises, like coughing and breathing deeply. This helps prevent pneumonia. Where to find more information American [employer]: cancer.org This information is not intended to replace advice given to you by your health care provider. Make sure you discuss any questions you have with your health care provider. Document Revised: 07/15/2022 Document Reviewed: 07/15/2022 Elsevier Patient Education  2024 [employer].

## 2023-04-03 NOTE — Progress Notes (Unsigned)
 Karen Church, female    DOB: [birth year]    MRN: [id number]   Brief patient profile:  19 yowf active smoker/MM  with pattern of acute flares of cough with purulent sptum and pleuritic pain usually same area most noted in back, sometimes in front with most recent cxr's form 11/21/19 showing persistent RML atx/ nodular changes R base so referred to pulmonary clinic in [place]  12/26/2019 by Karen Church and proved to have GOLD 3 copd 09/2022      History of Present Illness  12/26/2019  Pulmonary/ 1st office eval/ Karen Church / [place] [facility]  Chief Complaint  Patient presents with   Pulmonary Consult    Referred by Karen Rutherford, NP for eval of abnormal cxr. Pt states has had PNA multiple times since 2008.  She had PNA last in Oct 2021- has had some weakness since then but overall her breathing is doing well. She has prod cough with clear sputum.  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 Continue trelegy daily  Nebulizer (machine) can be used up to every 4 hours if needed        04/24/2022  f/u ov/[name]/Karen Church re: copd  maint on prn saba   Chief Complaint  Patient presents with   Follow-up    Pt f/u for COPD states that she is doing well and breathing is baseline  Dyspnea:  Not limited by breathing from desired activities  / still doing landscaping  Cough: am congestion white / also worse breathing x first hour each am    Sleeping: 45 degrees recliner  SABA use:  saba few times a month  /  has neb very rarely  02: none   Rec.  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  Work on inhaler technique:  >>>  Remember how golfers warm up by taking practice swings - do this with an empty inhaler  Plan B = Backup (to supplement plan A, not to replace it) Only use your albuterol inhaler as a rescue medication Plan C = Crisis (  instead of Plan B but only if Plan B stops working) - only use your albuterol nebulizer if you first try Plan B The key is to stop smoking completely before smoking completely stops you!  Please schedule a follow up office visit in 6 weeks, call sooner if needed with all medications /inhalers/ solutions in hand so we can verify exactly what you are taking. This includes all medications from all doctors and over the counters     01/01/2023  f/u ov/[name]/Karen Church re: GOLD 3 copd  maint on no resp rx  / still smoking  Only brought breztri sample / w/u for microscopic hematuria needs scope and possible stent  Chief Complaint  Patient presents with   Follow-up    Needs risk assessment for kidney procedure. She is c/o increased chest tightness, prod cough with clear sputum, and SOB x 2 months. Still smoking.    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  Work on inhaler technique: Plan B = Backup (to supplement plan A, not to replace it) Only use your albuterol inhaler as a rescue medication Plan C = Crisis (instead of Plan B but only if Plan B stops working) - only use your albuterol nebulizer if you first try Plan B  Do plan C right before you leave home for your procedure  Please schedule a follow up visit in 3 months but call sooner if needed - bring inhalers    04/05/2023  f/u ov/[name]/Karen Church re: GOLD 3 maint on breztri prn/ still smoking   Chief Complaint  Patient presents with   Follow-up    Copd gold   Dyspnea:  winter mode =  very sedentrary - not limited by doe so not taking Breztri much at all  Cough: clear mucus  Sleeping: 30 degree recliner x years   s  resp cc  SABA use: rare hfa/ never neb  02: none  LCS: needs 10/2023 LDSCT      No obvious day to day or daytime variability or assoc excess/ purulent sputum or mucus plugs or hemoptysis or cp or chest tightness, subjective wheeze or overt sinus or hb symptoms.    Also denies any obvious fluctuation of symptoms with weather or environmental changes or other aggravating or alleviating factors except as outlined above   No unusual exposure hx or h/o childhood pna/ asthma or knowledge of premature birth.  Current Allergies, Complete Past Medical History, Past Surgical History, Family History, and Social History were reviewed in [employer]  record.  ROS  The following are not active complaints unless bolded Hoarseness, sore throat, dysphagia, dental problems, itching, sneezing,  nasal congestion or discharge of excess mucus or purulent secretions, ear ache,   fever, chills, sweats, unintended wt loss or wt gain, classically pleuritic or exertional cp,  orthopnea pnd or arm/hand swelling  or leg swelling,  presyncope, palpitations, abdominal pain, anorexia, nausea, vomiting, diarrhea  or change in bowel habits or change in bladder habits, change in stools or change in urine, dysuria, hematuria,  rash, arthralgias, visual complaints, headache, numbness, weakness or ataxia or problems with walking or coordination,  change in mood or  memory.        Current Meds  Medication Sig   albuterol (PROVENTIL) (2.5 MG/3ML) 0.083% nebulizer solution Take 2.5 mg by nebulization every 6 (six) hours as needed for wheezing or shortness of breath.   albuterol (VENTOLIN HFA) 108 (90 Base) MCG/ACT inhaler Inhale 2 puffs into the lungs every 4 (four) hours as needed for wheezing or shortness of breath.   aspirin EC 81 MG tablet Take 81 mg by mouth daily.   Budeson-Glycopyrrol-Formoterol (BREZTRI AEROSPHERE) 160-9-4.8 MCG/ACT AERO Inhale 2 puffs into the lungs 2 (two) times daily. Take 2 puffs first thing in am and then another 2 puffs about 12 hours later.   cholecalciferol (VITAMIN D3) 25 MCG (1000 UNIT) tablet Take 1,000 Units by mouth daily.   Multiple Vitamin (MULTIVITAMIN WITH MINERALS) TABS Take 1 tablet by mouth daily.   nitroGLYCERIN (NITROSTAT) 0.4 MG SL tablet Place 1 tablet (0.4 mg total) under the tongue every 5 (five) minutes x 3 doses as needed for chest pain (If no relief after 3rd dose proceed to ED or call 911).   Omega 3 1000 MG CAPS Take 1,000 mg by mouth daily.   phenazopyridine (PYRIDIUM) 95 MG tablet Take 95 mg by mouth 3 (three) times daily as needed for pain.   rosuvastatin (CRESTOR) 10 MG tablet Take 1 tablet (10 mg total) by mouth  daily.          Past Medical History:  Diagnosis Date   Carpal tunnel syndrome    COPD (chronic obstructive pulmonary disease) (HCC)    Objective:    wts  04/05/2023        104  01/01/2023      106  11/03/2022       105 04/24/2022       106  12/29/2021     102   02/21/20 113 lb 12.8 oz (51.6 kg)  12/26/19 113 lb (51.3 kg)  09/14/18 120 lb (54.4 kg)    Vital signs reviewed  04/05/2023  - Note at rest 02 sats  89% on RA   General appearance:    anb wf nad    HEENT :  Oropharynx  clear / poor dentition   Nasal turbinates nl    NECK :  without JVD/Nodes/TM/ nl carotid upstrokes bilaterally   LUNGS: no acc muscle use,  Mod barrel  contour chest wall with bilateral  Distant insp/exp rhonchi  and  without cough on insp or exp maneuvers and mod  Hyperresonant  to  percussion bilaterally     CV:  RRR  no s3 or murmur or increase in P2, and no edema   ABD:  soft and nontender with pos mid insp Hoover's  in the supine position. No bruits or organomegaly appreciated, bowel sounds nl  MS:   Ext warm without deformities or   obvious joint restrictions , calf tenderness, cyanosis or clubbing  SKIN: warm and dry without lesions    NEURO:  alert, approp, nl sensorium with  no motor or cerebellar deficits apparent.               I personally reviewed images and agree with radiology impression as follows:  Chest CT 10/19/22  with contrast 1. Continued stability of multiple lung nodules the largest in the left apex measuring 1 cm x 3 y 2. Emphysematous scarring.     I personally reviewed images and agree with radiology impression as follows:  CXR: 11/01/22   pa and lateral 1. No acute cardiopulmonary disease. 2. Hyperinflation with subtle chronic interstitial prominence over the mid to lower lungs.    Assessment

## 2023-04-05 ENCOUNTER — Encounter: Payer: Self-pay | Admitting: Internal Medicine

## 2023-04-05 ENCOUNTER — Ambulatory Visit: Payer: Medicare HMO | Admitting: Internal Medicine

## 2023-04-05 VITALS — BP 144/75 | HR 83 | Ht 62.0 in | Wt 104.4 lb

## 2023-04-05 DIAGNOSIS — F17210 Nicotine dependence, cigarettes, uncomplicated: Secondary | ICD-10-CM | POA: Diagnosis not present

## 2023-04-05 DIAGNOSIS — J449 Chronic obstructive pulmonary disease, unspecified: Secondary | ICD-10-CM | POA: Diagnosis not present

## 2023-04-05 DIAGNOSIS — A159 Respiratory tuberculosis unspecified: Secondary | ICD-10-CM | POA: Diagnosis not present

## 2023-04-05 LAB — URINE CULTURE

## 2023-04-05 NOTE — Assessment & Plan Note (Signed)
 Lung cancer screening:  see CT chest below 10/2022 so needs 10/2023 LDSCT   4-5 min discussion re active cigarette smoking in addition to office E&M  Ask about tobacco use:   ongoing and high risk  Advise quitting   I took an extended  opportunity with this patient to outline the consequences of continued cigarette use  in airway disorders based on all the data we have from the multiple national lung health studies (perfomed over decades at millions of dollars in cost)  indicating that smoking cessation, not choice of inhalers or pulmonary physicians, is the most important aspect of her  care preop, post op and for the rest of her life  Assess willingness:  ? committed at this point for stopping 2 weeks before surgery. Assist in quit attempt:  Per PCP when ready Arrange follow up:   Follow up per Primary Care planned

## 2023-04-05 NOTE — Assessment & Plan Note (Signed)
 See cxr 12/26/2019 > minimal residual atx  - HRCT chest 01/22/20 1. There are innumerable new pulmonary nodules throughout the lungs,most concentrated in the upper lobes. The majority of these nodules are ground-glass in character, however several are solid and spiculated in morphology, the largest in the posterior left upper lobe measuring 1.0 x 0.9 cm and in the right upper lobe measuring 0.9 x 0.6 cm. Multiplicity and distribution are generally most suggestive of atypical infection, however morphology of the largest solid nodules is concerning for malignancy. Recommend initial follow-up CT in 3 months to assess for stability or resolution, and it may be further necessary to characterize the largest nodules for FDG avidity by PET-CT. Recommend multidisciplinary thoracic referral for further evaluation. 2. Small consolidations of the medial segment right middle lobe and lingula, similar to prior examination and consistent with sequelae of prior atypical infection, including atypical mycobacterium. 3. Diffuse bilateral bronchial wall thickening, consistent with nonspecific infectious or inflammatory bronchitis. 4. No significant bronchiectasis. 5. Emphysema. 6. Coronary artery disease. Aortic atherosclerosis.rec >> return for pfts, quant TB and Quant Ig's as well as Alpha one /  esr/eos and sputum for routine and afb if sample avail> ordered 02/21/2020  - Sputum 02/22/20  NO WBC SEEN FEW GRAM POSITIVE COCCI IN PAIRS RARE GRAM POSITIVE RODS > nl flora  - Quantiferon Gold TB  02/21/20  Neg  - Sputum  02/29/20  afb neg smear >>> neg culture - assoc wt loss 12/29/2021 > repeat CT chest s contrast  01/29/22 no change, resume routine screening if qualifies   Chest CT 10/19/22  with contrast 1. Continued stability of multiple lung nodules the largest in the left apex measuring 1 cm. 2. Emphysematous scarring.   >>> f/u yearly as part of LCS> next due sept 2025

## 2023-04-05 NOTE — Assessment & Plan Note (Signed)
 Active smoker/MM  - 12/26/2019  Rec mucinex / flutter and continue trelegy  - alpha one phenotype 02/21/20  MM level 213  - d/c'd trelegy ? Summer of 2023 and no worse at [facility] 12/29/2021 so left off and just rec saba prn  - PFT's  09/29/22  FEV1 0.92 (47 % ) ratio 0.54  p 12 % improvement from saba p 0 prior to study with DLCO  9.53 (55%)   and FV curve mildly concave   - 11/03/2022  After extensive coaching inhaler device,  effectiveness =    60% (short ti) > trial of breztri 2bid and f/u 6 weeks with option to change to symb 80/spiriva in case of MAI   >>>04/05/2023  After extensive coaching inhaler device,  effectiveness =  60%  (short ti)    Group D (now reclassified as E) in terms of symptom/risk and laba/lama/ICS  therefore appropriate rx at this point >>>  breztri and approp saba

## 2023-04-05 NOTE — Patient Instructions (Addendum)
 The key is to stop smoking completely before smoking completely stops you!  Work on inhaler technique:  relax and gently blow all the way out then take a nice smooth full deep breath back in, triggering the inhaler at same time you start breathing in.  Hold breath in for at least  5 seconds if you can. Blow out breztri thru nose. Rinse and gargle with water when done.  If mouth or throat bother you at all,  try brushing teeth/gums/tongue with arm and hammer toothpaste/ make a slurry and gargle and spit out.   >>>  Remember how golfers warm up by taking practice swings - do this with an empty inhaler    Please schedule a follow up visit in 6 months but call sooner if needed

## 2023-04-05 NOTE — Assessment & Plan Note (Signed)
Counseled re importance of smoking cessation but did not meet time criteria for separate billing           Each maintenance medication was reviewed in detail including emphasizing most importantly the difference between maintenance and prns and under what circumstances the prns are to be triggered using an action plan format where appropriate.  Total time for H and P, chart review, counseling, reviewing hfa /nebdevice(s) and generating customized AVS unique to this office visit / same day charting = 25 min       

## 2023-04-08 ENCOUNTER — Telehealth: Payer: Self-pay

## 2023-04-08 ENCOUNTER — Other Ambulatory Visit: Payer: Self-pay

## 2023-04-08 DIAGNOSIS — C661 Malignant neoplasm of right ureter: Secondary | ICD-10-CM

## 2023-04-08 MED ORDER — MAGNESIUM CITRATE PO SOLN: 1.0000 | Freq: Once | ORAL | 0 refills | Status: AC

## 2023-04-08 NOTE — Telephone Encounter (Signed)
 I spoke with patient, communication added to surgery workque

## 2023-04-08 NOTE — Telephone Encounter (Signed)
 Patient needing a call back regarding scheduling surgery.  Call:  [phone]

## 2023-04-09 ENCOUNTER — Telehealth: Payer: Self-pay | Admitting: *Deleted

## 2023-04-09 NOTE — Telephone Encounter (Signed)
   Pre-operative Risk Assessment    Patient Name: Karen Church  DOB: [birth year] MRN: [id number]   Date of last office visit: 03/09/2023 Date of next office visit: NONE    Request for Surgical Clearance    Procedure:   ROBOTIC NEPHROURETERECTOMY WITH CYSTOCSOPY  Date of Surgery:  Clearance 05/17/23                                Surgeon:  DR. Ronne Binning Surgeon's Group or Practice Name:  [facility] [place] Phone number:  [phone] Fax number:  [phone]   Type of Clearance Requested:   - Medical  - Pharmacy:  Hold Aspirin NOT INDICATED   Type of Anesthesia:  General    Additional requests/questions:    Wilhemina Cash   04/09/2023, 7:16 AM

## 2023-04-12 NOTE — Telephone Encounter (Signed)
   Patient Name: Karen Church  DOB: July 21, 1950 MRN: [id number]  Primary Cardiologist: Marjo Bicker, MD  Chart reviewed as part of pre-operative protocol coverage. Given past medical history and time since last visit, based on ACC/AHA guidelines, Karen Church is at acceptable risk for the planned procedure without further cardiovascular testing.   Patient was seen by Dr. Jenene Slicker on 03/09/2023 and was cleared for procedure at that time. Per Dr. Bridgett Larsson, "ok to hold aspirin for 5 days prior to the procedure and resume when okay from bleeding standpoint."  I will route this recommendation as well as the note from most recent office visit to the requesting party via Epic fax function and remove from pre-op pool.  Please call with questions.  Joylene Grapes, NP 04/12/2023, 10:40 AM

## 2023-04-21 DIAGNOSIS — R944 Abnormal results of kidney function studies: Secondary | ICD-10-CM | POA: Diagnosis not present

## 2023-04-21 DIAGNOSIS — Z8719 Personal history of other diseases of the digestive system: Secondary | ICD-10-CM | POA: Diagnosis not present

## 2023-04-21 DIAGNOSIS — I10 Essential (primary) hypertension: Secondary | ICD-10-CM | POA: Diagnosis not present

## 2023-04-21 DIAGNOSIS — F17218 Nicotine dependence, cigarettes, with other nicotine-induced disorders: Secondary | ICD-10-CM | POA: Diagnosis not present

## 2023-04-21 DIAGNOSIS — Z0000 Encounter for general adult medical examination without abnormal findings: Secondary | ICD-10-CM | POA: Diagnosis not present

## 2023-04-21 DIAGNOSIS — R0989 Other specified symptoms and signs involving the circulatory and respiratory systems: Secondary | ICD-10-CM | POA: Diagnosis not present

## 2023-04-21 DIAGNOSIS — J449 Chronic obstructive pulmonary disease, unspecified: Secondary | ICD-10-CM | POA: Diagnosis not present

## 2023-04-21 DIAGNOSIS — E782 Mixed hyperlipidemia: Secondary | ICD-10-CM | POA: Diagnosis not present

## 2023-04-21 DIAGNOSIS — Z23 Encounter for immunization: Secondary | ICD-10-CM | POA: Diagnosis not present

## 2023-04-21 DIAGNOSIS — R7303 Prediabetes: Secondary | ICD-10-CM | POA: Diagnosis not present

## 2023-04-21 DIAGNOSIS — C661 Malignant neoplasm of right ureter: Secondary | ICD-10-CM | POA: Diagnosis not present

## 2023-04-21 DIAGNOSIS — R079 Chest pain, unspecified: Secondary | ICD-10-CM | POA: Diagnosis not present

## 2023-05-11 NOTE — Patient Instructions (Addendum)
 Karen Church  05/11/2023     @PREFPERIOPPHARMACY @   Your procedure is scheduled on  05/17/2023.   Report to [facility] at  0600  A.M.    Call this number if you have problems the morning of surgery:  [phone]  If you experience any cold or flu symptoms such as cough, fever, chills, shortness of breath, etc. between now and your scheduled surgery, please notify us at the above number.   Remember:       Your last dose of aspirin should have been on 05/11/2023.        Use your nebulizer and your inhaler before you come and bring your rescue inhaler with you.   Do not eat after midnight.   You may drink clear liquids until  0330 am on 05/17/2023.    Clear liquids allowed are:                    Water, Juice (No red color; non-citric and without pulp; diabetics please choose diet or no sugar options), Carbonated beverages (diabetics please choose diet or no sugar options), Clear Tea (No creamer, milk, or cream, including half & half and powdered creamer), Black Coffee Only (No creamer, milk or cream, including half & half and powdered creamer), and Clear Sports drink (No red color; diabetics please choose diet or no sugar options)    Take these medicines the morning of surgery with A SIP OF WATER                                                         None.    Do not wear jewelry, make-up or nail polish, including gel polish,  artificial nails, or any other type of covering on natural nails (fingers and  toes).  Do not wear lotions, powders, or perfumes, or deodorant.  Do not shave 48 hours prior to surgery.  Men may shave face and neck.  Do not bring valuables to the hospital.  [facility] is not responsible for any belongings or valuables.  Contacts, dentures or bridgework may not be worn into surgery.  Leave your suitcase in the car.  After surgery it may be brought to your room.  For patients admitted to the hospital, discharge time will be determined by your  treatment team.  Patients discharged the day of surgery will not be allowed to drive home and must have someone with them for 24 hours.    Special instructions:   DO NOT smoke tobacco or vape for 24 hours before your procedure.  Please read over the following fact sheets that you were given. Pain Booklet, Coughing and Deep Breathing, Blood Transfusion Information, Lab Information, Surgical Site Infection Prevention, Anesthesia Post-op Instructions, and Care and Recovery After Surgery       Transurethral Resection of Bladder Tumor, Care After The following information offers guidance on how to care for yourself after your procedure. Your health care provider may also give you more specific instructions. If you have problems or questions, contact your health care provider. What can I expect after the procedure? After the procedure, it is common to have: A small amount of blood or small blood clots in your urine for up to 2 weeks. Soreness or mild pain from  your catheter. After your catheter is removed, you may have mild soreness, especially when urinating. A need to urinate often. Pain in your lower abdomen. Follow these instructions at home: Medicines  Take over-the-counter and prescription medicines only as told by your health care provider. If you were prescribed an antibiotic medicine, take it as told by your health care provider. Do not stop taking the antibiotic even if you start to feel better. Ask your health care provider if the medicine prescribed to you: Requires you to avoid driving or using machinery. Can cause constipation. You may need to take these actions to prevent or treat constipation: Drink enough fluid to keep your urine pale yellow. Take over-the-counter or prescription medicines. Eat foods that are high in fiber, such as beans, whole grains, and fresh fruits and vegetables. Limit foods that are high in fat and processed sugars, such as fried or sweet  foods. Activity  If you were given a sedative during the procedure, it can affect you for several hours. Do not drive or operate machinery until your health care provider says that it is safe. Rest as told by your health care provider. Avoid sitting for a long time without moving. Get up to take short walks every 1-2 hours. This is important to improve blood flow and breathing. Ask for help if you feel weak or unsteady. Do not lift anything that is heavier than 10 lb (4.5 kg), or the limit that you are told, until your health care provider says that it is safe. Avoid intense physical activity for as long as told by your health care provider. Do not have sex until your health care provider approves. Return to your normal activities as told by your health care provider. Ask your health care provider what activities are safe for you. General instructions If you have a catheter, follow instructions from your health care provider about caring for your catheter and your drainage bag. Do not drink alcohol for as long as told by your health care provider. This is especially important if you are taking prescription pain medicines. Do not use any products that contain nicotine or tobacco. These products include cigarettes, chewing tobacco, and vaping devices, such as e-cigarettes. If you need help quitting, ask your health care provider. Wear compression stockings as told by your health care provider. These stockings help to prevent blood clots and reduce swelling in your legs. Keep all follow-up visits. This is important. You will need to be followed closely with regular checks of your bladder and urethra (cystoscopies) to make sure that the cancer does not come back. Contact a health care provider if: You have blood in your urine for more than 2 weeks. You become constipated. Signs of constipation may include: Having fewer than three bowel movements in a week. Difficulty having a bowel movement. Stools  that are dry, hard, or larger than normal. You have a urinary catheter in place, and you have: Spasms or pain. Problems with your catheter or your catheter is blocked. Your catheter has been taken out but you are unable to urinate. You have signs of infection, such as: Fever or chills. Cloudy or bad-smelling urine. Get help right away if: You have severe abdominal pain that gets worse or does not improve with medicine. You have a lot of large blood clots in your urine. You develop swelling or pain in your leg. You have difficulty breathing. These symptoms may be an emergency. Get help right away. Call 911. Do not wait to  see if the symptoms will go away. Do not drive yourself to the hospital. Summary After your procedure, it is common to have a small amount of blood or small blood clots in your urine, soreness or mild pain from your catheter, and pain in your lower abdomen. Take over-the-counter and prescription medicines only as told by your health care provider. Rest as told by your health care provider. Follow your health care provider's instructions about returning to normal activities. Ask what activities are safe for you. If you have a catheter, follow instructions from your health care provider about caring for your catheter and your drainage bag. This information is not intended to replace advice given to you by your health care provider. Make sure you discuss any questions you have with your health care provider. Document Revised: 01/31/2021 Document Reviewed: 01/31/2021 Elsevier Patient Education  2024 [employer].General Anesthesia, Adult, Care After The following information offers guidance on how to care for yourself after your procedure. Your health care provider may also give you more specific instructions. If you have problems or questions, contact your health care provider. What can I expect after the procedure? After the procedure, it is common for people to: Have pain  or discomfort at the IV site. Have nausea or vomiting. Have a sore throat or hoarseness. Have trouble concentrating. Feel cold or chills. Feel weak, sleepy, or tired (fatigue). Have soreness and body aches. These can affect parts of the body that were not involved in surgery. Follow these instructions at home: For the time period you were told by your health care provider:  Rest. Do not participate in activities where you could fall or become injured. Do not drive or use machinery. Do not drink alcohol. Do not take sleeping pills or medicines that cause drowsiness. Do not make important decisions or sign legal documents. Do not take care of children on your own. General instructions Drink enough fluid to keep your urine pale yellow. If you have sleep apnea, surgery and certain medicines can increase your risk for breathing problems. Follow instructions from your health care provider about wearing your sleep device: Anytime you are sleeping, including during daytime naps. While taking prescription pain medicines, sleeping medicines, or medicines that make you drowsy. Return to your normal activities as told by your health care provider. Ask your health care provider what activities are safe for you. Take over-the-counter and prescription medicines only as told by your health care provider. Do not use any products that contain nicotine or tobacco. These products include cigarettes, chewing tobacco, and vaping devices, such as e-cigarettes. These can delay incision healing after surgery. If you need help quitting, ask your health care provider. Contact a health care provider if: You have nausea or vomiting that does not get better with medicine. You vomit every time you eat or drink. You have pain that does not get better with medicine. You cannot urinate or have bloody urine. You develop a skin rash. You have a fever. Get help right away if: You have trouble breathing. You have chest  pain. You vomit blood. These symptoms may be an emergency. Get help right away. Call 911. Do not wait to see if the symptoms will go away. Do not drive yourself to the hospital. Summary After the procedure, it is common to have a sore throat, hoarseness, nausea, vomiting, or to feel weak, sleepy, or fatigue. For the time period you were told by your health care provider, do not drive or use machinery. Get  help right away if you have difficulty breathing, have chest pain, or vomit blood. These symptoms may be an emergency. This information is not intended to replace advice given to you by your health care provider. Make sure you discuss any questions you have with your health care provider. Document Revised: 04/25/2021 Document Reviewed: 04/25/2021 Elsevier Patient Education  2024 [employer].How to Use Chlorhexidine at Home in the Shower Chlorhexidine gluconate (CHG) is a germ-killing (antiseptic) wash that's used to clean the skin. It can get rid of the germs that normally live on the skin and can keep them away for about 24 hours. If you're having surgery, you may be told to shower with CHG at home the night before surgery. This can help lower your risk for infection. To use CHG wash in the shower, follow the steps below. Supplies needed: CHG body wash. Clean washcloth. Clean towel. How to use CHG in the shower Follow these steps unless you're told to use CHG in a different way: Start the shower. Use your normal soap and shampoo to wash your face and hair. Turn off the shower or move out of the shower stream. Pour CHG onto a clean washcloth. Do not use any type of brush or rough sponge. Start at your neck, washing your body down to your toes. Make sure you: Wash the part of your body where the surgery will be done for at least 1 minute. Do not scrub. Do not use CHG on your head or face unless your health care provider tells you to. If it gets into your ears or eyes, rinse them well  with water. Do not wash your genitals with CHG. Wash your back and under your arms. Make sure to wash skin folds. Let the CHG sit on your skin for 1-2 minutes or as long as told. Rinse your entire body in the shower, including all body creases and folds. Turn off the shower. Dry off with a clean towel. Do not put anything on your skin afterward, such as powder, lotion, or perfume. Put on clean clothes or pajamas. If it's the night before surgery, sleep in clean sheets. General tips Use CHG only as told, and follow the instructions on the label. Use the full amount of CHG as told. This is often one bottle. Do not smoke and stay away from flames after using CHG. Your skin may feel sticky after using CHG. This is normal. The sticky feeling will go away as the CHG dries. Do not use CHG: If you have a chlorhexidine allergy or have reacted to chlorhexidine in the past. On open wounds or areas of skin that have broken skin, cuts, or scrapes. On babies younger than 78 months of age. Contact a health care provider if: You have questions about using CHG. Your skin gets irritated or itchy. You have a rash after using CHG. You swallow any CHG. Call your local poison control center [phone] in the [place] Your eyes itch badly, or they become very red or swollen. Your hearing changes. You have trouble seeing. If you can't reach your provider, go to an urgent care or emergency room. Do not drive yourself. Get help right away if: You have swelling or tingling in your mouth or throat. You make high-pitched whistling sounds when you breathe, most often when you breathe out (wheeze). You have trouble breathing. These symptoms may be an emergency. Call 911 right away. Do not wait to see if the symptoms will go away. Do not drive yourself  to the hospital. This information is not intended to replace advice given to you by your health care provider. Make sure you discuss any questions you have with  your health care provider. Document Revised: 08/11/2022 Document Reviewed: 08/07/2021 Elsevier Patient Education  2024 [employer].

## 2023-05-12 ENCOUNTER — Encounter (HOSPITAL_COMMUNITY): Payer: Self-pay

## 2023-05-12 ENCOUNTER — Encounter (HOSPITAL_COMMUNITY)
Admission: RE | Admit: 2023-05-12 | Discharge: 2023-05-12 | Disposition: A | Discharge status: Home / Self Care | Source: Ambulatory Visit | Attending: Urology | Admitting: Urology

## 2023-05-12 VITALS — BP 134/67 | HR 66 | Temp 98.0°F | Resp 18 | Ht 62.0 in | Wt 104.5 lb

## 2023-05-12 DIAGNOSIS — Z01812 Encounter for preprocedural laboratory examination: Secondary | ICD-10-CM | POA: Insufficient documentation

## 2023-05-12 DIAGNOSIS — Z01818 Encounter for other preprocedural examination: Secondary | ICD-10-CM

## 2023-05-12 DIAGNOSIS — C661 Malignant neoplasm of right ureter: Secondary | ICD-10-CM | POA: Insufficient documentation

## 2023-05-12 HISTORY — DX: Other specified postprocedural states: Z98.890

## 2023-05-12 HISTORY — DX: Nausea with vomiting, unspecified: R11.2

## 2023-05-12 LAB — CBC WITH DIFFERENTIAL/PLATELET
Abs Immature Granulocytes: 0.01 10*3/uL (ref 0.00–0.07)
Basophils Absolute: 0 10*3/uL (ref 0.0–0.1)
Basophils Relative: 1 %
Eosinophils Absolute: 0.2 10*3/uL (ref 0.0–0.5)
Eosinophils Relative: 4 %
HCT: 45.1 % (ref 36.0–46.0)
Hemoglobin: 14.9 g/dL (ref 12.0–15.0)
Immature Granulocytes: 0 %
Lymphocytes Relative: 39 %
Lymphs Abs: 2.3 10*3/uL (ref 0.7–4.0)
MCH: 32.9 pg (ref 26.0–34.0)
MCHC: 33 g/dL (ref 30.0–36.0)
MCV: 99.6 fL (ref 80.0–100.0)
Monocytes Absolute: 0.3 10*3/uL (ref 0.1–1.0)
Monocytes Relative: 5 %
Neutro Abs: 3 10*3/uL (ref 1.7–7.7)
Neutrophils Relative %: 51 %
Platelets: 212 10*3/uL (ref 150–400)
RBC: 4.53 MIL/uL (ref 3.87–5.11)
RDW: 12.8 % (ref 11.5–15.5)
WBC: 5.9 10*3/uL (ref 4.0–10.5)
nRBC: 0 % (ref 0.0–0.2)

## 2023-05-12 LAB — BASIC METABOLIC PANEL WITH GFR
Anion gap: 10 (ref 5–15)
BUN: 14 mg/dL (ref 8–23)
CO2: 28 mmol/L (ref 22–32)
Calcium: 9.3 mg/dL (ref 8.9–10.3)
Chloride: 101 mmol/L (ref 98–111)
Creatinine, Ser: 0.72 mg/dL (ref 0.44–1.00)
GFR, Estimated: >60 mL/min (ref >60)
Glucose, Bld: 78 mg/dL (ref 70–99)
Potassium: 4.3 mmol/L (ref 3.5–5.1)
Sodium: 139 mmol/L (ref 135–145)

## 2023-05-12 LAB — TYPE AND SCREEN
ABO/RH(D): A POS
Antibody Screen: NEGATIVE

## 2023-05-14 MED ORDER — GEMCITABINE CHEMO FOR BLADDER INSTILLATION 2000 MG
2000.0000 mg | Freq: Once | INTRAVENOUS | Status: DC
  Filled 2023-05-14: qty 52.6

## 2023-05-14 NOTE — Anesthesia Preprocedure Evaluation (Addendum)
 Anesthesia Evaluation  Patient identified by MRN, date of birth, ID band Patient awake    Reviewed: Allergy & Precautions, NPO status , Patient's Chart, lab work & pertinent test results  History of Anesthesia Complications (+) PONV and history of anesthetic complications  Airway Mallampati: I  TM Distance: >3 FB Neck ROM: Full    Dental  (+) Missing, Loose, Chipped, Poor Dentition,    Pulmonary shortness of breath, pneumonia, resolved, COPD,  COPD inhaler, Current Smoker and Patient abstained from smoking.    + decreased breath sounds      Cardiovascular + CAD  Normal cardiovascular exam Rhythm:Regular Rate:Normal     Neuro/Psych  Neuromuscular disease  negative psych ROS   GI/Hepatic negative GI ROS, Neg liver ROS,,,  Endo/Other  negative endocrine ROS    Renal/GU Renal diseasehydronephrosis     Musculoskeletal  (+) Arthritis , Osteoarthritis,    Abdominal   Peds  Hematology negative hematology ROS (+)   Anesthesia Other Findings Melanoma  Reproductive/Obstetrics                             Anesthesia Physical Anesthesia Plan  ASA: 3  Anesthesia Plan: General   Post-op Pain Management: Tylenol PO (pre-op)*   Induction: Intravenous  PONV Risk Score and Plan: 3 and Ondansetron, Dexamethasone, Midazolam and Treatment may vary due to age or medical condition  Airway Management Planned: Oral ETT  Additional Equipment: None  Intra-op Plan:   Post-operative Plan: Extubation in OR  Informed Consent: I have reviewed the patients History and Physical, chart, labs and discussed the procedure including the risks, benefits and alternatives for the proposed anesthesia with the patient or authorized representative who has indicated his/her understanding and acceptance.     Dental advisory given  Plan Discussed with: CRNA  Anesthesia Plan Comments:        Anesthesia Quick  Evaluation

## 2023-05-17 ENCOUNTER — Encounter (HOSPITAL_COMMUNITY): Payer: Self-pay | Admitting: Urology

## 2023-05-17 ENCOUNTER — Inpatient Hospital Stay (HOSPITAL_COMMUNITY): Payer: Self-pay | Admitting: Anesthesiology

## 2023-05-17 ENCOUNTER — Encounter (HOSPITAL_COMMUNITY)
Admission: RE | Disposition: A | Discharge status: Home / Self Care | Payer: Self-pay | Source: Home / Self Care | Attending: Urology

## 2023-05-17 ENCOUNTER — Inpatient Hospital Stay (HOSPITAL_COMMUNITY)
Admission: RE | Admit: 2023-05-17 | Discharge: 2023-05-21 | DRG: 657 | Disposition: A | Discharge status: Home / Self Care | Payer: Medicare HMO | Attending: Urology | Admitting: Urology

## 2023-05-17 ENCOUNTER — Other Ambulatory Visit: Payer: Self-pay

## 2023-05-17 DIAGNOSIS — F17210 Nicotine dependence, cigarettes, uncomplicated: Secondary | ICD-10-CM

## 2023-05-17 DIAGNOSIS — C7911 Secondary malignant neoplasm of bladder: Secondary | ICD-10-CM | POA: Diagnosis not present

## 2023-05-17 DIAGNOSIS — J449 Chronic obstructive pulmonary disease, unspecified: Secondary | ICD-10-CM

## 2023-05-17 DIAGNOSIS — Z7951 Long term (current) use of inhaled steroids: Secondary | ICD-10-CM | POA: Diagnosis not present

## 2023-05-17 DIAGNOSIS — R310 Gross hematuria: Secondary | ICD-10-CM | POA: Diagnosis present

## 2023-05-17 DIAGNOSIS — C661 Malignant neoplasm of right ureter: Secondary | ICD-10-CM

## 2023-05-17 DIAGNOSIS — Z79899 Other long term (current) drug therapy: Secondary | ICD-10-CM | POA: Diagnosis not present

## 2023-05-17 DIAGNOSIS — C641 Malignant neoplasm of right kidney, except renal pelvis: Secondary | ICD-10-CM | POA: Diagnosis not present

## 2023-05-17 DIAGNOSIS — Z7982 Long term (current) use of aspirin: Secondary | ICD-10-CM | POA: Diagnosis not present

## 2023-05-17 DIAGNOSIS — Z83719 Family history of colon polyps, unspecified: Secondary | ICD-10-CM | POA: Diagnosis not present

## 2023-05-17 DIAGNOSIS — Z85820 Personal history of malignant melanoma of skin: Secondary | ICD-10-CM

## 2023-05-17 DIAGNOSIS — C7919 Secondary malignant neoplasm of other urinary organs: Secondary | ICD-10-CM | POA: Diagnosis not present

## 2023-05-17 DIAGNOSIS — Z888 Allergy status to other drugs, medicaments and biological substances status: Secondary | ICD-10-CM

## 2023-05-17 DIAGNOSIS — C679 Malignant neoplasm of bladder, unspecified: Secondary | ICD-10-CM | POA: Diagnosis not present

## 2023-05-17 DIAGNOSIS — I2510 Atherosclerotic heart disease of native coronary artery without angina pectoris: Secondary | ICD-10-CM

## 2023-05-17 HISTORY — PX: ROBOT ASSITED LAPAROSCOPIC NEPHROURETERECTOMY: SHX6077

## 2023-05-17 HISTORY — PX: CYSTOSCOPY: SHX5120

## 2023-05-17 HISTORY — PX: TRANSURETHRAL RESECTION OF BLADDER TUMOR: SHX2575

## 2023-05-17 LAB — BASIC METABOLIC PANEL WITH GFR
Anion gap: 10 (ref 5–15)
BUN: 12 mg/dL (ref 8–23)
CO2: 24 mmol/L (ref 22–32)
Calcium: 8.7 mg/dL — ABNORMAL LOW (ref 8.9–10.3)
Chloride: 103 mmol/L (ref 98–111)
Creatinine, Ser: 0.98 mg/dL (ref 0.44–1.00)
GFR, Estimated: >60 mL/min (ref >60)
Glucose, Bld: 182 mg/dL — ABNORMAL HIGH (ref 70–99)
Potassium: 3.8 mmol/L (ref 3.5–5.1)
Sodium: 137 mmol/L (ref 135–145)

## 2023-05-17 LAB — CBC
HCT: 40.2 % (ref 36.0–46.0)
Hemoglobin: 13.1 g/dL (ref 12.0–15.0)
MCH: 32.6 pg (ref 26.0–34.0)
MCHC: 32.6 g/dL (ref 30.0–36.0)
MCV: 100 fL (ref 80.0–100.0)
Platelets: 207 10*3/uL (ref 150–400)
RBC: 4.02 MIL/uL (ref 3.87–5.11)
RDW: 12.7 % (ref 11.5–15.5)
WBC: 10.3 10*3/uL (ref 4.0–10.5)
nRBC: 0 % (ref 0.0–0.2)

## 2023-05-17 LAB — ABO/RH: ABO/RH(D): A POS

## 2023-05-17 SURGERY — NEPHROURETERECTOMY, ROBOT-ASSISTED, LAPAROSCOPIC
Anesthesia: General | Site: Renal | Laterality: Right

## 2023-05-17 MED ORDER — GLYCOPYRROLATE PF 0.2 MG/ML IJ SOSY
PREFILLED_SYRINGE | INTRAMUSCULAR | Status: DC | PRN
  Administered 2023-05-17: .2 mg via INTRAVENOUS

## 2023-05-17 MED ORDER — DEXAMETHASONE SODIUM PHOSPHATE 10 MG/ML IJ SOLN
INTRAMUSCULAR | Status: DC | PRN
  Administered 2023-05-17: 10 mg via INTRAVENOUS

## 2023-05-17 MED ORDER — ALBUMIN HUMAN 5 % IV SOLN
INTRAVENOUS | Status: AC
  Filled 2023-05-17: qty 250

## 2023-05-17 MED ORDER — PROPOFOL 10 MG/ML IV BOLUS
INTRAVENOUS | Status: AC
  Filled 2023-05-17: qty 20

## 2023-05-17 MED ORDER — ONDANSETRON HCL 4 MG/2ML IJ SOLN
INTRAMUSCULAR | Status: DC | PRN
  Administered 2023-05-17: 4 mg via INTRAVENOUS

## 2023-05-17 MED ORDER — LACTATED RINGERS IV SOLN: INTRAVENOUS | Status: DC

## 2023-05-17 MED ORDER — IPRATROPIUM-ALBUTEROL 0.5-2.5 (3) MG/3ML IN SOLN: 3.0000 mL | Freq: Four times a day (QID) | RESPIRATORY_TRACT | Status: DC | PRN

## 2023-05-17 MED ORDER — GLYCOPYRROLATE PF 0.2 MG/ML IJ SOSY
PREFILLED_SYRINGE | INTRAMUSCULAR | Status: AC
  Filled 2023-05-17: qty 1

## 2023-05-17 MED ORDER — SUCCINYLCHOLINE CHLORIDE 200 MG/10ML IV SOSY
PREFILLED_SYRINGE | INTRAVENOUS | Status: AC
  Filled 2023-05-17: qty 10

## 2023-05-17 MED ORDER — CHLORHEXIDINE GLUCONATE 0.12 % MT SOLN
15.0000 mL | Freq: Once | OROMUCOSAL | Status: AC
  Administered 2023-05-17: 15 mL via OROMUCOSAL

## 2023-05-17 MED ORDER — SODIUM CHLORIDE 0.9 % IR SOLN
Status: DC | PRN
  Administered 2023-05-17 (×2): 3000 mL via INTRAVESICAL

## 2023-05-17 MED ORDER — FENTANYL CITRATE (PF) 100 MCG/2ML IJ SOLN
INTRAMUSCULAR | Status: DC | PRN
  Administered 2023-05-17 (×2): 50 ug via INTRAVENOUS

## 2023-05-17 MED ORDER — SODIUM CHLORIDE 0.9 % IV SOLN: 12.5000 mg | INTRAVENOUS | Status: DC | PRN

## 2023-05-17 MED ORDER — ACETAMINOPHEN 500 MG PO TABS
1000.0000 mg | ORAL_TABLET | Freq: Once | ORAL | Status: AC
  Administered 2023-05-17: 1000 mg via ORAL

## 2023-05-17 MED ORDER — ALBUTEROL SULFATE (2.5 MG/3ML) 0.083% IN NEBU: 2.5000 mg | INHALATION_SOLUTION | RESPIRATORY_TRACT | Status: DC | PRN

## 2023-05-17 MED ORDER — ACETAMINOPHEN 160 MG/5ML PO SOLN
960.0000 mg | Freq: Once | ORAL | Status: AC
  Filled 2023-05-17: qty 30

## 2023-05-17 MED ORDER — KETAMINE HCL 50 MG/5ML IJ SOSY
PREFILLED_SYRINGE | INTRAMUSCULAR | Status: DC | PRN
  Administered 2023-05-17: 30 mg via INTRAVENOUS

## 2023-05-17 MED ORDER — ONDANSETRON HCL 4 MG/2ML IJ SOLN
INTRAMUSCULAR | Status: AC
  Filled 2023-05-17: qty 2

## 2023-05-17 MED ORDER — ROSUVASTATIN CALCIUM 10 MG PO TABS
10.0000 mg | ORAL_TABLET | Freq: Every day | ORAL | Status: DC
  Administered 2023-05-18 – 2023-05-21 (×4): 10 mg via ORAL
  Filled 2023-05-17 (×4): qty 1

## 2023-05-17 MED ORDER — ZOLPIDEM TARTRATE 5 MG PO TABS
5.0000 mg | ORAL_TABLET | Freq: Every evening | ORAL | Status: DC | PRN
  Administered 2023-05-19: 5 mg via ORAL
  Filled 2023-05-17: qty 1

## 2023-05-17 MED ORDER — ALBUTEROL SULFATE HFA 108 (90 BASE) MCG/ACT IN AERS: 2.0000 | INHALATION_SPRAY | RESPIRATORY_TRACT | Status: DC | PRN

## 2023-05-17 MED ORDER — FENTANYL CITRATE (PF) 100 MCG/2ML IJ SOLN
INTRAMUSCULAR | Status: AC
  Filled 2023-05-17: qty 2

## 2023-05-17 MED ORDER — EPHEDRINE SULFATE-NACL 50-0.9 MG/10ML-% IV SOSY
PREFILLED_SYRINGE | INTRAVENOUS | Status: DC | PRN
  Administered 2023-05-17: 5 mg via INTRAVENOUS

## 2023-05-17 MED ORDER — DEXAMETHASONE SODIUM PHOSPHATE 10 MG/ML IJ SOLN
INTRAMUSCULAR | Status: AC
  Filled 2023-05-17: qty 2

## 2023-05-17 MED ORDER — DIPHENHYDRAMINE HCL 12.5 MG/5ML PO ELIX: 12.5000 mg | ORAL_SOLUTION | Freq: Four times a day (QID) | ORAL | Status: DC | PRN

## 2023-05-17 MED ORDER — SUGAMMADEX SODIUM 200 MG/2ML IV SOLN
INTRAVENOUS | Status: AC
  Filled 2023-05-17: qty 2

## 2023-05-17 MED ORDER — STERILE WATER FOR IRRIGATION IR SOLN
Status: DC | PRN
Start: 2023-05-17 — End: 2023-05-17
  Administered 2023-05-17: 500 mL

## 2023-05-17 MED ORDER — CEFAZOLIN SODIUM-DEXTROSE 2-4 GM/100ML-% IV SOLN
INTRAVENOUS | Status: AC
  Filled 2023-05-17: qty 100

## 2023-05-17 MED ORDER — HYDROMORPHONE HCL 1 MG/ML IJ SOLN
0.2500 mg | INTRAMUSCULAR | Status: DC | PRN
  Administered 2023-05-17: 0.25 mg via INTRAVENOUS
  Administered 2023-05-17: 0.5 mg via INTRAVENOUS
  Filled 2023-05-17: qty 0.5

## 2023-05-17 MED ORDER — PROPOFOL 10 MG/ML IV BOLUS
INTRAVENOUS | Status: DC | PRN
Start: 2023-05-17 — End: 2023-05-17
  Administered 2023-05-17: 50 mg via INTRAVENOUS
  Administered 2023-05-17: 100 mg via INTRAVENOUS

## 2023-05-17 MED ORDER — LIDOCAINE HCL (PF) 2 % IJ SOLN
INTRAMUSCULAR | Status: AC
  Filled 2023-05-17: qty 5

## 2023-05-17 MED ORDER — ORAL CARE MOUTH RINSE: 15.0000 mL | Freq: Once | OROMUCOSAL | Status: AC

## 2023-05-17 MED ORDER — DIPHENHYDRAMINE HCL 50 MG/ML IJ SOLN: 12.5000 mg | Freq: Four times a day (QID) | INTRAMUSCULAR | Status: DC | PRN

## 2023-05-17 MED ORDER — PHENYLEPHRINE HCL-NACL 20-0.9 MG/250ML-% IV SOLN
INTRAVENOUS | Status: AC
  Filled 2023-05-17: qty 250

## 2023-05-17 MED ORDER — ROCURONIUM BROMIDE 10 MG/ML (PF) SYRINGE
PREFILLED_SYRINGE | INTRAVENOUS | Status: AC
  Filled 2023-05-17: qty 10

## 2023-05-17 MED ORDER — HYDROMORPHONE HCL 1 MG/ML IJ SOLN
0.5000 mg | INTRAMUSCULAR | Status: DC | PRN
  Administered 2023-05-17 – 2023-05-18 (×4): 1 mg via INTRAVENOUS
  Filled 2023-05-17 (×4): qty 1

## 2023-05-17 MED ORDER — DEXAMETHASONE SODIUM PHOSPHATE 10 MG/ML IJ SOLN
INTRAMUSCULAR | Status: AC
Start: 2023-05-17 — End: ?
  Filled 2023-05-17: qty 1

## 2023-05-17 MED ORDER — ROCURONIUM BROMIDE 10 MG/ML (PF) SYRINGE
PREFILLED_SYRINGE | INTRAVENOUS | Status: AC
Start: 2023-05-17 — End: ?
  Filled 2023-05-17: qty 10

## 2023-05-17 MED ORDER — CEFAZOLIN SODIUM-DEXTROSE 2-4 GM/100ML-% IV SOLN
2.0000 g | Freq: Three times a day (TID) | INTRAVENOUS | Status: AC
  Administered 2023-05-17 – 2023-05-18 (×2): 2 g via INTRAVENOUS
  Filled 2023-05-17 (×2): qty 100

## 2023-05-17 MED ORDER — OXYCODONE HCL 5 MG/5ML PO SOLN: 5.0000 mg | Freq: Once | ORAL | Status: DC | PRN

## 2023-05-17 MED ORDER — EPHEDRINE 5 MG/ML INJ
INTRAVENOUS | Status: AC
  Filled 2023-05-17: qty 5

## 2023-05-17 MED ORDER — SODIUM CHLORIDE 0.9 % IV SOLN: INTRAVENOUS | Status: AC

## 2023-05-17 MED ORDER — BUPIVACAINE HCL (PF) 0.5 % IJ SOLN
INTRAMUSCULAR | Status: DC | PRN
  Administered 2023-05-17: 30 mL

## 2023-05-17 MED ORDER — BUPIVACAINE HCL (PF) 0.5 % IJ SOLN
INTRAMUSCULAR | Status: AC
  Filled 2023-05-17: qty 30

## 2023-05-17 MED ORDER — PHENYLEPHRINE 80 MCG/ML (10ML) SYRINGE FOR IV PUSH (FOR BLOOD PRESSURE SUPPORT)
PREFILLED_SYRINGE | INTRAVENOUS | Status: AC
  Filled 2023-05-17: qty 10

## 2023-05-17 MED ORDER — CHLORHEXIDINE GLUCONATE 0.12 % MT SOLN
OROMUCOSAL | Status: AC
  Filled 2023-05-17: qty 15

## 2023-05-17 MED ORDER — OXYBUTYNIN CHLORIDE 5 MG PO TABS: 5.0000 mg | ORAL_TABLET | Freq: Three times a day (TID) | ORAL | Status: DC | PRN

## 2023-05-17 MED ORDER — LIDOCAINE HCL (CARDIAC) PF 100 MG/5ML IV SOSY
PREFILLED_SYRINGE | INTRAVENOUS | Status: DC | PRN
Start: 2023-05-17 — End: 2023-05-17
  Administered 2023-05-17: 60 mg via INTRAVENOUS

## 2023-05-17 MED ORDER — DEXMEDETOMIDINE HCL IN NACL 80 MCG/20ML IV SOLN
INTRAVENOUS | Status: AC
  Filled 2023-05-17: qty 20

## 2023-05-17 MED ORDER — OXYCODONE HCL 5 MG PO TABS: 5.0000 mg | ORAL_TABLET | Freq: Once | ORAL | Status: DC | PRN

## 2023-05-17 MED ORDER — SUGAMMADEX SODIUM 200 MG/2ML IV SOLN
INTRAVENOUS | Status: DC | PRN
  Administered 2023-05-17: 200 mg via INTRAVENOUS

## 2023-05-17 MED ORDER — ALBUTEROL SULFATE HFA 108 (90 BASE) MCG/ACT IN AERS
INHALATION_SPRAY | RESPIRATORY_TRACT | Status: DC | PRN
  Administered 2023-05-17: 4 via RESPIRATORY_TRACT

## 2023-05-17 MED ORDER — ONDANSETRON HCL 4 MG/2ML IJ SOLN
4.0000 mg | INTRAMUSCULAR | Status: DC | PRN
  Administered 2023-05-17 – 2023-05-18 (×4): 4 mg via INTRAVENOUS
  Filled 2023-05-17 (×5): qty 2

## 2023-05-17 MED ORDER — PHENYLEPHRINE HCL-NACL 20-0.9 MG/250ML-% IV SOLN
INTRAVENOUS | Status: DC | PRN
  Administered 2023-05-17: 50 ug/min via INTRAVENOUS

## 2023-05-17 MED ORDER — ASPIRIN 81 MG PO TBEC
81.0000 mg | DELAYED_RELEASE_TABLET | Freq: Every day | ORAL | Status: DC
  Administered 2023-05-18 – 2023-05-21 (×4): 81 mg via ORAL
  Filled 2023-05-17 (×4): qty 1

## 2023-05-17 MED ORDER — SENNOSIDES-DOCUSATE SODIUM 8.6-50 MG PO TABS
2.0000 | ORAL_TABLET | Freq: Every day | ORAL | Status: DC
  Administered 2023-05-18 – 2023-05-20 (×3): 2 via ORAL
  Filled 2023-05-17 (×4): qty 2

## 2023-05-17 MED ORDER — KETAMINE HCL 50 MG/5ML IJ SOSY
PREFILLED_SYRINGE | INTRAMUSCULAR | Status: AC
  Filled 2023-05-17: qty 5

## 2023-05-17 MED ORDER — OXYCODONE HCL 5 MG PO TABS
5.0000 mg | ORAL_TABLET | ORAL | Status: DC | PRN
  Administered 2023-05-18 – 2023-05-20 (×9): 5 mg via ORAL
  Filled 2023-05-17 (×10): qty 1

## 2023-05-17 MED ORDER — CEFAZOLIN SODIUM-DEXTROSE 2-4 GM/100ML-% IV SOLN
2.0000 g | INTRAVENOUS | Status: AC
  Administered 2023-05-17: 2 g via INTRAVENOUS

## 2023-05-17 MED ORDER — BUDESON-GLYCOPYRROL-FORMOTEROL 160-9-4.8 MCG/ACT IN AERO: 2.0000 | INHALATION_SPRAY | Freq: Two times a day (BID) | RESPIRATORY_TRACT | Status: DC | PRN

## 2023-05-17 MED ORDER — ROCURONIUM BROMIDE 100 MG/10ML IV SOLN
INTRAVENOUS | Status: DC | PRN
  Administered 2023-05-17: 70 mg via INTRAVENOUS

## 2023-05-17 MED ORDER — ACETAMINOPHEN 500 MG PO TABS
ORAL_TABLET | ORAL | Status: AC
  Filled 2023-05-17: qty 2

## 2023-05-17 SURGICAL SUPPLY — 75 items
BAG DRAIN URO TABLE W/ADPT NS (BAG) ×4 IMPLANT
BAG HAMPER (MISCELLANEOUS) ×4 IMPLANT
BAG LAPAROSCOPIC 12 15 PORT 16 (BASKET) ×4 IMPLANT
BAG RETRIEVAL 12/15 (BASKET) ×3 IMPLANT
BAG URINE DRAIN 2000ML AR STRL (UROLOGICAL SUPPLIES) ×4 IMPLANT
BAG URINE DRAIN TURP 4L (OSTOMY) ×1 IMPLANT
BLADE SURG 15 STRL LF DISP TIS (BLADE) ×4 IMPLANT
CATH FOLEY 2WAY SLVR 30CC 20FR (CATHETERS) ×1 IMPLANT
CHLORAPREP W/TINT 26 (MISCELLANEOUS) ×4 IMPLANT
CLIP LIGATING HEM O LOK PURPLE (MISCELLANEOUS) ×8 IMPLANT
CLOTH BEACON ORANGE TIMEOUT ST (SAFETY) ×4 IMPLANT
COVER MAYO STAND XLG (MISCELLANEOUS) ×4 IMPLANT
COVER SURGICAL LIGHT HANDLE (MISCELLANEOUS) ×8 IMPLANT
COVER TIP SHEARS 8 DVNC (MISCELLANEOUS) ×4 IMPLANT
DERMABOND ADVANCED .7 DNX12 (GAUZE/BANDAGES/DRESSINGS) ×8 IMPLANT
DRAIN CHANNEL RND F F (WOUND CARE) ×1 IMPLANT
DRAPE ARM DVNC X/XI (DISPOSABLE) ×16 IMPLANT
DRAPE COLUMN DVNC XI (DISPOSABLE) ×4 IMPLANT
DRAPE HALF SHEET 40X57 (DRAPES) ×4 IMPLANT
DRAPE INCISE IOBAN 66X45 STRL (DRAPES) ×4 IMPLANT
DRIVER NDL LRG 8 DVNC XI (INSTRUMENTS) ×3 IMPLANT
DRIVER NDLE LRG 8 DVNC XI (INSTRUMENTS) ×3 IMPLANT
DRSG TEGADERM 2-3/8X2-3/4 SM (GAUZE/BANDAGES/DRESSINGS) ×2 IMPLANT
DRSG TEGADERM 4X4.75 (GAUZE/BANDAGES/DRESSINGS) ×1 IMPLANT
ELECT LOOP 22F BIPOLAR SML (ELECTROSURGICAL) ×3 IMPLANT
ELECT REM PT RETURN 15FT ADLT (MISCELLANEOUS) ×4 IMPLANT
ELECTRODE LOOP 22F BIPOLAR SML (ELECTROSURGICAL) ×4 IMPLANT
EVACUATOR SILICONE 100CC (DRAIN) ×1 IMPLANT
FORCEPS BPLR FENES DVNC XI (FORCEP) ×4 IMPLANT
FORCEPS PROGRASP DVNC XI (FORCEP) ×4 IMPLANT
GAUZE SPONGE 4X4 12PLY STRL (GAUZE/BANDAGES/DRESSINGS) ×1 IMPLANT
GLOVE BIO SURGEON STRL SZ8 (GLOVE) ×8 IMPLANT
GLOVE BIOGEL PI IND STRL 7.0 (GLOVE) ×8 IMPLANT
GLOVE BIOGEL PI IND STRL 8 (GLOVE) ×8 IMPLANT
GLOVE ECLIPSE 6.5 STRL STRAW (GLOVE) ×3 IMPLANT
GLOVE SURG SS PI 7.5 STRL IVOR (GLOVE) ×1 IMPLANT
GOWN STRL REUS W/TWL LRG LVL3 (GOWN DISPOSABLE) ×9 IMPLANT
GOWN STRL REUS W/TWL XL LVL3 (GOWN DISPOSABLE) ×9 IMPLANT
IRRIG SUCT STRYKERFLOW 2 WTIP (MISCELLANEOUS) ×3 IMPLANT
IRRIGATION SUCT STRKRFLW 2 WTP (MISCELLANEOUS) ×1 IMPLANT
KIT TURNOVER CYSTO (KITS) ×4 IMPLANT
KIT TURNOVER KIT A (KITS) ×4 IMPLANT
MANIFOLD NEPTUNE II (INSTRUMENTS) ×5 IMPLANT
NDL HYPO 21X1.5 SAFETY (NEEDLE) ×3 IMPLANT
NDL INSUFFLATION 14GA 120MM (NEEDLE) ×3 IMPLANT
NEEDLE HYPO 21X1.5 SAFETY (NEEDLE) ×3 IMPLANT
NEEDLE INSUFFLATION 14GA 120MM (NEEDLE) ×3 IMPLANT
PACK CYSTO (CUSTOM PROCEDURE TRAY) ×4 IMPLANT
PACK LAP CHOLE LZT030E (CUSTOM PROCEDURE TRAY) ×4 IMPLANT
PAD ARMBOARD POSITIONER FOAM (MISCELLANEOUS) ×6 IMPLANT
PENCIL HANDSWITCHING (ELECTRODE) ×4 IMPLANT
POSITIONER HEAD 8X9X4 ADT (SOFTGOODS) ×4 IMPLANT
RELOAD STAPLE 45 2.5 WHT DVNC (STAPLE) ×6 IMPLANT
RELOAD STAPLER 2.5X45 WHT DVNC (STAPLE) ×6 IMPLANT
SCISSORS MNPLR CVD DVNC XI (INSTRUMENTS) ×4 IMPLANT
SEAL UNIV 5-12 XI (MISCELLANEOUS) ×16 IMPLANT
SET BASIN LINEN APH (SET/KITS/TRAYS/PACK) ×4 IMPLANT
SET TUBE DA VINCI INSUFFLATOR (TUBING) ×1 IMPLANT
SOL .9 NS 3000ML IRR UROMATIC (IV SOLUTION) ×8 IMPLANT
SPONGE DRAIN TRACH 4X4 STRL 2S (GAUZE/BANDAGES/DRESSINGS) ×1 IMPLANT
STAPLER 45 SUREFORM DVNC (STAPLE) ×4 IMPLANT
STAPLER RELOAD 2.5X45 WHT DVNC (STAPLE) ×6 IMPLANT
STAPLER VISISTAT (STAPLE) ×1 IMPLANT
SUT ETHILON 3 0 PS 1 (SUTURE) ×4 IMPLANT
SUT MNCRL AB 4-0 PS2 18 (SUTURE) ×8 IMPLANT
SUT PDS AB CT VIOLET #0 27IN (SUTURE) ×1 IMPLANT
SUT VLOC BARB 180 ABS3/0GR12 (SUTURE) ×3 IMPLANT
SUTURE VLOC BRB 180 ABS3/0GR12 (SUTURE) ×1 IMPLANT
SYR 20ML LL LF (SYRINGE) ×4 IMPLANT
SYR 30ML LL (SYRINGE) ×4 IMPLANT
SYR TOOMEY IRRIG 70ML (MISCELLANEOUS) ×3 IMPLANT
SYRINGE TOOMEY IRRIG 70ML (MISCELLANEOUS) ×4 IMPLANT
TOWEL OR 17X26 4PK STRL BLUE (TOWEL DISPOSABLE) ×4 IMPLANT
TROCAR Z-THREAD OPTICAL 5X100M (TROCAR) ×4 IMPLANT
WATER STERILE IRR 500ML POUR (IV SOLUTION) ×5 IMPLANT

## 2023-05-17 NOTE — Anesthesia Postprocedure Evaluation (Signed)
 Anesthesia Post Note  Patient: Karen Church  Procedure(s) Performed: NEPHROURETERECTOMY, ROBOT-ASSISTED, LAPAROSCOPIC (Right: Renal) CYSTOSCOPY (Bladder) TURBT (TRANSURETHRAL RESECTION OF BLADDER TUMOR) (Bladder)  Patient location during evaluation: PACU Anesthesia Type: General Level of consciousness: awake and alert Pain management: pain level controlled Vital Signs Assessment: post-procedure vital signs reviewed and stable Respiratory status: spontaneous breathing, nonlabored ventilation, respiratory function stable and patient connected to nasal cannula oxygen Cardiovascular status: blood pressure returned to baseline and stable Postop Assessment: no apparent nausea or vomiting Anesthetic complications: no   There were no known notable events for this encounter.   Last Vitals:  Vitals:   05/17/23 1230 05/17/23 1245  BP: 121/68 119/62  Pulse: 88 89  Resp: 19 18  Temp: (!) 36.3 C   SpO2: 94% 91%    Last Pain:  Vitals:   05/17/23 1245  PainSc: Asleep                 Zelta Enfield L Aletheia Tangredi

## 2023-05-17 NOTE — Plan of Care (Signed)
  Problem: Education: Goal: Knowledge of General Education information will improve Description: Including pain rating scale, medication(s)/side effects and non-pharmacologic comfort measures Outcome: Progressing   Problem: Health Behavior/Discharge Planning: Goal: Ability to manage health-related needs will improve Outcome: Progressing   Problem: Clinical Measurements: Goal: Respiratory complications will improve Outcome: Progressing   Problem: Coping: Goal: Level of anxiety will decrease Outcome: Progressing   Problem: Pain Managment: Goal: General experience of comfort will improve and/or be controlled Outcome: Progressing

## 2023-05-17 NOTE — Op Note (Addendum)
 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  Right robot assisted laparoscopic radical nephroureterectomy 2.  cystoscopy with resection of ureteral orifice   Attending: Wilkie Aye, MD   Assistant: Franky Macho, MD   Anesthesia: General   Estimated blood loss: 50 cc   Drains: 16 French Foley catheter, JP drain   Specimens: Left radical nephroureterectomy, surround gerotas fasia, perinephric fat, complete ureter and bladder cuff. Bladder chips   Antibiotics: ancef   Findings: 1 renal artery and 1 renal vein. No masses/lesion in the bladder on cystoscopy. .   Indications: Patient is a 73 year old with a history of right low grade ureteral TCC.  After discussing treatment options patient decided to proceed with right robot assisted laparoscopic radical nephroureterectomy   Procedure in detail: Prior to procedure consent was obtained. Patient was brought to the operating room and briefing was done sure correct patient, correct procedure, correct site.  General anesthesia was in administered patient was placed in the dorsal lithotomy position. Her genitalia was prepped and draped in the usual sterile fashion. A 27 french resectoscope was passed in to the urethra and then bladder. We notes no masses/lesions in the bladder. Using the bipolar resectoscope e resected circumferentially around the right ureteral orifice to fat. Hemostasis was obtained with electrocautery. We then removed the bladder chips. A 20 french foley was then placed and the patient was repositioned in to the left lateral decubitus position. Their abdomen and flank was then prepped and draped usual sterile fashion.  A Veress needle was used to obtain pneumoperitoneum.  Once pneumoperitoneum was reestablished to 15 mmHg we then placed a 8 mm camera port lateral to the umbilicus at the latera; edge of rectus.  We then proceeded to place 4 more robotic ports. We  then placed 1 assistant port. We then docked the robot.  We then dissected along the white line of Toldt.  We then reflected the colon medially.  We then identified the psoas muscle.  Once this was done we traced it down to the iliac vessels and identified the ureter.  Once we identified the gonadal vein and ureter were then traced this to the renal hilum.  The renal vein and renal artery were skeletonized.  We did we identified one renal vein one renal artery.  Using the robotic power stapler within ligated the renal artery.  Once this was done we then used a second staple load to ligate the renal vein.  Once this was done we then freed the kidney from its lateral and posterior attachments. Once the kidney was freed we then proceeded to dissect the ureter further. We dissected it down to where it crossed the iliac vessels. We excised the ureter with appoximately a 2cm bladder cuff. We then closed the defect with a 2-0 V-lock suture in 2 layers. We then filed the bladder with 150cc and noted no leak. We then used a Endo Catch bag to remove the specimen.  Once the specimen was in the Endo Catch bag we then inspected the retroperitoneum and noted no residual bleeding. We then placed a JP drain in the left lower quadrant robot port. This was secured to the skin with a 2-0 nylon. We then removed our instruments, undocked the robot, and released the pneumoperitoneum.  We then made a midline incision above the umbilicus to remove the specimen.  Once the specimen was removed we then closed the camera and assistant ports with 0 Vicryl  in interrupted fashion.  We then closed the midline incision with looped PDS in a running fashion.  We then closed the overlying skin with 2-0 Vicryl in running fashion.  The skin was then closed with staples. The assistant was utilized for retraction, suction, passing suture, cutting suture and closing the incisions. This concluded the procedure which resulted by the patient.    Complications: None   Condition: Stable, x-rayed, transferred to PACU.   Plan: Patient is to be admitted for inpatient stay. They will be started on a clear liquid diet POD#1. Her foley will remain in place for 2 weeks and she will have a cystogram prior to removal.

## 2023-05-17 NOTE — H&P (Signed)
 HPI: [name] is a 73yo here for right robotic nephroureterectomy. She underwent ureteral biopsy for a 4cm proximal ureteral tumor which came back low grade TCC. She has a 40 pk year smoking hx. She is having intermittent gross hematuria with the right ureteral stent in place. She has a bowel resection when she was 15.      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   Dyspnea     Hydronephrosis of right kidney     Melanoma (HCC)      R arm   Pneumonia            Surgical History:      Past Surgical History:  Procedure Laterality Date   ABDOMINAL HYSTERECTOMY       BALLOON DILATION N/A 08/11/2021    Procedure: BALLOON DILATION;  Surgeon: Karen Bal, DO;  Location: [facility];  Service: Endoscopy;  Laterality: N/A;   BIOPSY   12/10/2015    Procedure: BIOPSY;  Surgeon: Karen Ade, MD;  Location: [facility];  Service: Endoscopy;;  Gastric     BIOPSY   08/11/2021    Procedure: BIOPSY;  Surgeon: Karen Bal, DO;  Location: [facility];  Service: Endoscopy;;  gastric     CESAREAN SECTION       COLONOSCOPY N/A 12/10/2015    normal   CYSTOSCOPY WITH RETROGRADE PYELOGRAM, URETEROSCOPY AND STENT PLACEMENT Bilateral 03/05/2023    Procedure: CYSTOSCOPY WITH BILATERAL RETROGRADE PYELOGRAM, RIGHT URETEROSCOPY WITH BIOPSY AND STENT PLACEMENT;  Surgeon: Karen Field, MD;  Location: [facility];  Service: Urology;  Laterality: Bilateral;   ESOPHAGOGASTRODUODENOSCOPY N/A 12/10/2015    normal esophagus s/p dilation, +H.pylori   ESOPHAGOGASTRODUODENOSCOPY (EGD) WITH PROPOFOL N/A 08/11/2021    Procedure: ESOPHAGOGASTRODUODENOSCOPY (EGD) WITH  PROPOFOL;  Surgeon: Karen Bal, DO;  Location: [facility];  Service: Endoscopy;  Laterality: N/A;  2:15pm   MALONEY DILATION N/A 12/10/2015    Procedure: [name] DILATION;  Surgeon: Karen Ade, MD;  Location: [facility];  Service: Endoscopy;  Laterality: N/A;   SKIN CANCER EXCISION       SMALL INTESTINE SURGERY N/A 1967    Resection          Home Medications:  Allergies as of 04/02/2023         Reactions    Lasix [furosemide] Swelling            Medication List           Accurate as of April 02, 2023  3:06 PM. If you have any questions, ask your nurse or doctor.              albuterol (2.5 MG/3ML) 0.083% nebulizer solution Commonly known as: PROVENTIL Take 2.5 mg by nebulization every 6 (six) hours as needed for wheezing or shortness of breath.    albuterol 108 (90 Base) MCG/ACT inhaler Commonly known as: VENTOLIN HFA Inhale 2 puffs into the lungs every 4 (four) hours as needed for wheezing or shortness of breath.    aspirin EC 81 MG  tablet Take 81 mg by mouth daily.    Breztri Aerosphere 160-9-4.8 MCG/ACT Aero Generic drug: Budeson-Glycopyrrol-Formoterol Inhale 2 puffs into the lungs 2 (two) times daily. Take 2 puffs first thing in am and then another 2 puffs about 12 hours later.    cholecalciferol 25 MCG (1000 UNIT) tablet Commonly known as: VITAMIN D3 Take 1,000 Units by mouth daily.    multivitamin with minerals Tabs tablet Take 1 tablet by mouth daily.    nitroGLYCERIN 0.4 MG SL tablet Commonly known as: Nitrostat Place 1 tablet (0.4 mg total) under the tongue every 5 (five) minutes x 3 doses as needed for chest pain (If no relief after 3rd dose proceed to ED or call 911).    Omega 3 1000 MG Caps Take 1,000 mg by mouth daily.    rosuvastatin 10 MG tablet Commonly known as: CRESTOR Take 1 tablet (10 mg total) by mouth daily.             Allergies:  Allergies      Allergies  Allergen Reactions   Lasix [Furosemide] Swelling         Family History:      Family History  Problem Relation Age of Onset   Colon polyps Sister     Colon polyps Brother     Colon cancer Neg Hx            Social History:  reports that she has been smoking cigarettes. She has a 25 pack-year smoking history. She has been exposed to tobacco smoke. She has never used smokeless tobacco. She reports that she does not drink alcohol and does not use drugs.   ROS: All other review of systems were reviewed and are negative except what is noted above in HPI   Physical Exam: BP (!) 153/78   Pulse 99   Constitutional:  Alert and oriented, No acute distress. HEENT: [place] AT, moist mucus membranes.  Trachea midline, no masses. Cardiovascular: No clubbing, cyanosis, or edema. Respiratory: Normal respiratory effort, no increased work of breathing. GI: Abdomen is soft, nontender, nondistended, no abdominal masses GU: No CVA tenderness.  Lymph: No cervical or inguinal lymphadenopathy. Skin: No rashes, bruises or suspicious lesions. Neurologic: Grossly intact, no focal deficits, moving all 4 extremities. Psychiatric: Normal mood and affect.   Laboratory Data: Recent Labs       Lab Results  Component Value Date    WBC 10.7 (H) 10/29/2021    HGB 13.6 10/29/2021    HCT 40.6 10/29/2021    MCV 94.2 10/29/2021    PLT 157 10/29/2021        Recent Labs       Lab Results  Component Value Date    CREATININE 0.73 02/23/2023        Recent Labs  No results found for: "PSA"     Recent Labs  No results found for: "TESTOSTERONE"     Recent Labs  No results found for: "HGBA1C"     Urinalysis Labs (Brief)          Component Value Date/Time    APPEARANCEUR Clear 03/15/2023 1345    GLUCOSEU Negative 03/15/2023 1345    BILIRUBINUR Negative 03/15/2023 1345    PROTEINUR 1+ (A) 03/15/2023 1345    NITRITE Negative 03/15/2023 1345    LEUKOCYTESUR 1+ (A) 03/15/2023 1345        Recent Labs       Lab Results  Component Value Date     LABMICR See below: 03/15/2023  WBCUA 6-10 (A) 03/15/2023    LABEPIT 0-10 03/15/2023    BACTERIA None seen 03/15/2023        Pertinent Imaging: CT 10/2022: Images reviewed and discussed with the patient  No results found for this or any previous visit.   No results found for this or any previous visit.   No results found for this or any previous visit.   No results found for this or any previous visit.   No results found for this or any previous visit.   No results found for this or any previous visit.   No results found for this or any previous visit.   No results found for this or any previous visit.     Assessment & Plan:     1. Ureteral cancer, right (HCC) We discussed the natural hx of renal TCC. We disucssed the treatment options including active surveillance. Renal ablation,  and radical nephroureterectomy. After discussing the options the patient elects for robotic right nephrourterectomy.

## 2023-05-17 NOTE — Progress Notes (Signed)
 RN tried to call report to floor but they were unable to take it at this time.  Patient put on PACU hold.

## 2023-05-17 NOTE — Anesthesia Procedure Notes (Signed)
 Procedure Name: Intubation Date/Time: 05/17/2023 8:10 AM  Performed by: Marquis Buggy, CRNAPre-anesthesia Checklist: Patient identified, Emergency Drugs available, Suction available and Timeout performed Patient Re-evaluated:Patient Re-evaluated prior to induction Oxygen Delivery Method: Circle system utilized Preoxygenation: Pre-oxygenation with 100% oxygen Induction Type: IV induction Ventilation: Mask ventilation with difficulty Laryngoscope Size: Mac and 3 Grade View: Grade I Tube type: Oral Laser Tube: Cuffed inflated with minimal occlusive pressure - saline Tube size: 7.0 mm Number of attempts: 1 Airway Equipment and Method: Stylet Placement Confirmation: ETT inserted through vocal cords under direct vision, positive ETCO2, CO2 detector and breath sounds checked- equal and bilateral Secured at: 21 cm Tube secured with: Tape Dental Injury: Teeth and Oropharynx as per pre-operative assessment  Future Recommendations: Recommend- induction with short-acting agent, and alternative techniques readily available

## 2023-05-17 NOTE — TOC CM/SW Note (Signed)
 Transition of Care [facility]) - Inpatient Brief Assessment   Patient Details  Name: Karen Church MRN: [id number] Date of Birth: February 09, 1951  [facility] CM/SW Contact:    Isabella Bowens, LCSWA Phone Number: 05/17/2023, 2:46 PM   Clinical Narrative:  Transition of Care Department [facility]) has reviewed patient and no TOC needs have been identified at this time. We will continue to monitor patient advancement through interdisciplinary progression rounds. If new patient transition needs arise, please place a TOC consult.  Transition of Care Asessment: Insurance and Status: Insurance coverage has been reviewed Patient has primary care physician: Yes Home environment has been reviewed: Single Family Home Prior level of function:: Independent Prior/Current Home Services: No current home services Social Drivers of Health Review: SDOH reviewed no interventions necessary Readmission risk has been reviewed: Yes Transition of care needs: no transition of care needs at this time

## 2023-05-17 NOTE — Transfer of Care (Signed)
 Immediate Anesthesia Transfer of Care Note  Patient: Karen Church  Procedure(s) Performed: NEPHROURETERECTOMY, ROBOT-ASSISTED, LAPAROSCOPIC (Right: Renal) CYSTOSCOPY (Bladder) TURBT (TRANSURETHRAL RESECTION OF BLADDER TUMOR) (Bladder)  Patient Location: PACU  Anesthesia Type:General  Level of Consciousness: awake, alert , and oriented  Airway & Oxygen Therapy: Patient Spontanous Breathing and Patient connected to nasal cannula oxygen  Post-op Assessment: Report given to RN and Post -op Vital signs reviewed and stable  Post vital signs: Reviewed and stable  Last Vitals:  Vitals Value Taken Time  BP 134/61 05/17/23 1051  Temp    Pulse 95 05/17/23 1056  Resp 25 05/17/23 1056  SpO2 100 % 05/17/23 1056  Vitals shown include unfiled device data.  Last Pain:  Vitals:   05/17/23 0719  PainSc: 0-No pain         Complications: No notable events documented.

## 2023-05-18 ENCOUNTER — Encounter (HOSPITAL_COMMUNITY): Payer: Self-pay | Admitting: Urology

## 2023-05-18 LAB — CBC
HCT: 38.8 % (ref 36.0–46.0)
Hemoglobin: 12.5 g/dL (ref 12.0–15.0)
MCH: 32.7 pg (ref 26.0–34.0)
MCHC: 32.2 g/dL (ref 30.0–36.0)
MCV: 101.6 fL — ABNORMAL HIGH (ref 80.0–100.0)
Platelets: 174 10*3/uL (ref 150–400)
RBC: 3.82 MIL/uL — ABNORMAL LOW (ref 3.87–5.11)
RDW: 12.9 % (ref 11.5–15.5)
WBC: 11.7 10*3/uL — ABNORMAL HIGH (ref 4.0–10.5)
nRBC: 0 % (ref 0.0–0.2)

## 2023-05-18 LAB — BASIC METABOLIC PANEL WITH GFR
Anion gap: 8 (ref 5–15)
BUN: 17 mg/dL (ref 8–23)
CO2: 30 mmol/L (ref 22–32)
Calcium: 8.8 mg/dL — ABNORMAL LOW (ref 8.9–10.3)
Chloride: 102 mmol/L (ref 98–111)
Creatinine, Ser: 1.44 mg/dL — ABNORMAL HIGH (ref 0.44–1.00)
GFR, Estimated: 38 mL/min — ABNORMAL LOW (ref >60)
Glucose, Bld: 103 mg/dL — ABNORMAL HIGH (ref 70–99)
Potassium: 4.5 mmol/L (ref 3.5–5.1)
Sodium: 140 mmol/L (ref 135–145)

## 2023-05-18 MED ORDER — CHLORHEXIDINE GLUCONATE CLOTH 2 % EX PADS
6.0000 | MEDICATED_PAD | Freq: Every day | CUTANEOUS | Status: DC
  Administered 2023-05-18 – 2023-05-21 (×4): 6 via TOPICAL

## 2023-05-18 NOTE — Progress Notes (Signed)
 Mobility Specialist Progress Note:    05/18/23 1624  Mobility  Activity Ambulated with assistance in hallway  Level of Assistance Contact guard assist, steadying assist  Assistive Device Front wheel walker  Distance Ambulated (ft) 75 ft  Range of Motion/Exercises Active;All extremities  Activity Response Tolerated well  Mobility Referral Yes  Mobility visit 1 Mobility  Mobility Specialist Start Time (ACUTE ONLY) 1520  Mobility Specialist Stop Time (ACUTE ONLY) 1545  Mobility Specialist Time Calculation (min) (ACUTE ONLY) 25 min   Pt received in bed, spouse in room. Agreeable to mobility, required CGA to stand and ambulate with RW. Tolerated well, c/o lower abdominal pain at incision site. Returned to room, left supine. All needs met.  Karen Church Mobility Specialist Please contact via Special educational needs teacher or  Rehab office at [phone]

## 2023-05-18 NOTE — Plan of Care (Signed)

## 2023-05-18 NOTE — Progress Notes (Signed)
 1 Day Post-Op Subjective: Patient reports good pain control. Patient ambulating. Tolerating clears. Negative flatus  Objective: Vital signs in last 24 hours: Temp:  [97.6 F (36.4 C)-98.5 F (36.9 C)] 98.5 F (36.9 C) (04/08 1300) Pulse Rate:  [50-83] 83 (04/08 1300) Resp:  [14-22] 20 (04/08 1031) BP: (104-136)/(39-50) 129/48 (04/08 1300) SpO2:  [96 %-100 %] 96 % (04/08 1300) Weight:  [48.2 kg] 48.2 kg (04/08 1031)  Intake/Output from previous day: 04/07 0701 - 04/08 0700 In: 1557.9 [I.V.:1457.9; IV Piggyback:100] Out: 930 [Urine:700; Drains:180; Blood:50] Intake/Output this shift: Total I/O In: 120 [P.O.:120] Out: -   Physical Exam:  General:alert, cooperative, and appears stated age GI: soft, non tender, normal bowel sounds, no palpable masses, no organomegaly, no inguinal hernia Female genitalia: not done Extremities: extremities normal, atraumatic, no cyanosis or edema  Lab Results: Recent Labs    05/17/23 1112 05/18/23 0421  HGB 13.1 12.5  HCT 40.2 38.8   BMET Recent Labs    05/17/23 1112 05/18/23 0421  NA 137 140  K 3.8 4.5  CL 103 102  CO2 24 30  GLUCOSE 182* 103*  BUN 12 17  CREATININE 0.98 1.44*  CALCIUM 8.7* 8.8*   No results for input(s): "LABPT", "INR" in the last 72 hours. No results for input(s): "LABURIN" in the last 72 hours. Results for orders placed or performed in visit on 04/02/23  Urine Culture     Status: None   Collection Time: 04/02/23  2:53 PM   Specimen: Urine   Urine  Result Value Ref Range Status   Urine Culture, Routine Final report  Final   Organism ID, Bacteria Comment  Final    Comment: Mixed urogenital flora Less than 10,000 colonies/mL     Studies/Results: No results found.  Assessment/Plan: POD#1 robotic nephroureterectomy Continue foley catheter Ambulate in halls with assistance Continue current pain control regiment   LOS: 1 day   Wilkie Aye 05/18/2023, 2:55 PM

## 2023-05-18 NOTE — Progress Notes (Signed)
 Mobility Specialist Progress Note:    05/18/23 0910  Mobility  Activity Ambulated with assistance in hallway  Level of Assistance Standby assist, set-up cues, supervision of patient - no hands on  Assistive Device Front wheel walker;None  Distance Ambulated (ft) 75 ft  Range of Motion/Exercises Active;All extremities  Activity Response Tolerated well  Mobility Referral Yes  Mobility visit 1 Mobility  Mobility Specialist Start Time (ACUTE ONLY) 0850  Mobility Specialist Stop Time (ACUTE ONLY) 0910  Mobility Specialist Time Calculation (min) (ACUTE ONLY) 20 min   Pt received in chair, family in room. Agreeable to mobility, required SBA to stand and ambulate with RW. Tolerated well, c/o lower abdominal pain. Returned to room, left pt in chair. Call bell in reach, all needs met.  Lawerance Bach Mobility Specialist Please contact via Special educational needs teacher or  Rehab office at [phone]

## 2023-05-18 NOTE — Plan of Care (Signed)

## 2023-05-19 NOTE — Progress Notes (Signed)
 Mobility Specialist Progress Note:    05/19/23 1034  Mobility  Activity Ambulated with assistance in hallway  Level of Assistance Standby assist, set-up cues, supervision of patient - no hands on  Assistive Device Front wheel walker  Distance Ambulated (ft) 70 ft  Range of Motion/Exercises Active;All extremities  Activity Response Tolerated well  Mobility Referral Yes  Mobility visit 1 Mobility  Mobility Specialist Start Time (ACUTE ONLY) 1010  Mobility Specialist Stop Time (ACUTE ONLY) 1035  Mobility Specialist Time Calculation (min) (ACUTE ONLY) 25 min   Pt received in bed, family in room.Agreeable to mobility, required SBA to stand and ambulate with RW. Tolerated well, SpO2 83% on RA after standing. SpO2 93% on 2L during ambulation. Returned to room, left pt supine. All needs met.   Lawerance Bach Mobility Specialist Please contact via Special educational needs teacher or  Rehab office at [phone]

## 2023-05-19 NOTE — Plan of Care (Signed)
   Problem: Elimination: Goal: Will not experience complications related to bowel motility Outcome: Progressing   Problem: Pain Managment: Goal: General experience of comfort will improve and/or be controlled Outcome: Progressing   Problem: Safety: Goal: Ability to remain free from injury will improve Outcome: Progressing

## 2023-05-20 LAB — SURGICAL PATHOLOGY

## 2023-05-20 MED ORDER — BISACODYL 10 MG RE SUPP
10.0000 mg | Freq: Once | RECTAL | Status: AC
  Administered 2023-05-20: 10 mg via RECTAL
  Filled 2023-05-20: qty 1

## 2023-05-20 NOTE — Progress Notes (Signed)
 3 Days Post-Op Subjective: Patient reports good pain control. Patient ambulating. Tolerating clears. Positive flatus  Objective: Vital signs in last 24 hours: Temp:  [97.8 F (36.6 C)-99 F (37.2 C)] 98 F (36.7 C) (04/10 1358) Pulse Rate:  [81-94] 94 (04/10 1358) Resp:  [16-24] 24 (04/10 1358) BP: (132-147)/(58-67) 132/67 (04/10 1358) SpO2:  [95 %-99 %] 99 % (04/10 1358)  Intake/Output from previous day: 04/09 0701 - 04/10 0700 In: 960 [P.O.:960] Out: 1375 [Urine:1300; Drains:75] Intake/Output this shift: Total I/O In: -  Out: 350 [Urine:275; Drains:75]  Physical Exam:  General:alert, cooperative, and appears stated age GI: soft, non tender, normal bowel sounds, no palpable masses, no organomegaly, no inguinal hernia Female genitalia: not done Extremities: extremities normal, atraumatic, no cyanosis or edema  Lab Results: Recent Labs    05/18/23 0421  HGB 12.5  HCT 38.8   BMET Recent Labs    05/18/23 0421  NA 140  K 4.5  CL 102  CO2 30  GLUCOSE 103*  BUN 17  CREATININE 1.44*  CALCIUM 8.8*   No results for input(s): "LABPT", "INR" in the last 72 hours. No results for input(s): "LABURIN" in the last 72 hours. Results for orders placed or performed in visit on 04/02/23  Urine Culture     Status: None   Collection Time: 04/02/23  2:53 PM   Specimen: Urine   Urine  Result Value Ref Range Status   Urine Culture, Routine Final report  Final   Organism ID, Bacteria Comment  Final    Comment: Mixed urogenital flora Less than 10,000 colonies/mL     Studies/Results: No results found.  Assessment/Plan: POD#3 robotic nephroureterectomy Continue foley catheter Ambulate in halls with assistance Continue current pain control regiment Regular diet Dulcolax supp   LOS: 3 days   Wilkie Aye 05/20/2023, 2:51 PM

## 2023-05-20 NOTE — Plan of Care (Signed)
  Problem: Education: Goal: Knowledge of General Education information will improve Description: Including pain rating scale, medication(s)/side effects and non-pharmacologic comfort measures 05/20/2023 1025 by Vergia Alcon, RN Outcome: Progressing 05/20/2023 1025 by Vergia Alcon, RN Outcome: Progressing   Problem: Health Behavior/Discharge Planning: Goal: Ability to manage health-related needs will improve 05/20/2023 1025 by Vergia Alcon, RN Outcome: Progressing 05/20/2023 1025 by Vergia Alcon, RN Outcome: Progressing

## 2023-05-20 NOTE — Plan of Care (Signed)
   Problem: Education: Goal: Knowledge of General Education information will improve Description: Including pain rating scale, medication(s)/side effects and non-pharmacologic comfort measures Outcome: Progressing   Problem: Clinical Measurements: Goal: Ability to maintain clinical measurements within normal limits will improve Outcome: Progressing Goal: Diagnostic test results will improve Outcome: Progressing

## 2023-05-20 NOTE — Progress Notes (Signed)
 Mobility Specialist Progress Note:    05/20/23 1125  Mobility  Activity Ambulated with assistance in hallway;Ambulated with assistance to bathroom  Level of Assistance Standby assist, set-up cues, supervision of patient - no hands on  Assistive Device Front wheel walker  Distance Ambulated (ft) 75 ft  Range of Motion/Exercises Active;All extremities  Activity Response Tolerated well  Mobility Referral Yes  Mobility visit 1 Mobility  Mobility Specialist Start Time (ACUTE ONLY) 1045  Mobility Specialist Stop Time (ACUTE ONLY) 1105  Mobility Specialist Time Calculation (min) (ACUTE ONLY) 20 min   Pt received in bed, family at bedside. Agreeable to mobility, required SBA to stand and ambulate with RW. Tolerated well, pt nauseous during session.  Returned to room, left pt in bathroom with NT and RN. All needs met.   Lawerance Bach Mobility Specialist Please contact via Special educational needs teacher or  Rehab office at [phone]

## 2023-05-21 MED ORDER — OXYCODONE HCL 5 MG PO TABS: 5.0000 mg | ORAL_TABLET | ORAL | 0 refills | Status: DC | PRN

## 2023-05-21 NOTE — Progress Notes (Signed)
 Discharge paper work was printed this AM. This RN went to discharge patient, and check if there was an order to D/C with foley and and proper D/C order. None found at this time. VSS. No pain. Education provided on foley and follow up. Messaged Dr. Ronne Binning for clarification on if patient is discharging today.

## 2023-05-21 NOTE — Progress Notes (Signed)
 Pt. Discharged via wheelchair with family.

## 2023-05-21 NOTE — Plan of Care (Signed)
  Problem: Education: Goal: Knowledge of General Education information will improve Description: Including pain rating scale, medication(s)/side effects and non-pharmacologic comfort measures Outcome: Adequate for Discharge   Problem: Health Behavior/Discharge Planning: Goal: Ability to manage health-related needs will improve Outcome: Adequate for Discharge   Problem: Clinical Measurements: Goal: Ability to maintain clinical measurements within normal limits will improve Outcome: Adequate for Discharge Goal: Diagnostic test results will improve Outcome: Progressing

## 2023-05-21 NOTE — Progress Notes (Signed)
 Per Dr. Ronne Binning patient may discharge today with foley. This Investment banker, operational was only able to see a discharge order in the order history not in the active orders.

## 2023-05-24 ENCOUNTER — Telehealth: Payer: Self-pay

## 2023-05-24 NOTE — Transitions of Care (Post Inpatient/ED Visit) (Signed)
 05/24/2023  Name: Karen Church MRN: [id number] DOB: [birth year]  Today's TOC FU Call Status: Today's TOC FU Call Status:: Successful TOC FU Call Completed TOC FU Call Complete Date: 05/24/23 Patient's Name and Date of Birth confirmed.  Transition Care Management Follow-up Telephone Call Date of Discharge: 05/24/23 [facility] Type of Discharge: Inpatient Admission Primary Inpatient Discharge Diagnosis:: Transitional cell carcinoma of kidney, right  Items Reviewed:  Medication reconciliation / review completed in EPIC was based on most recent discharge summary / Provider visit updates and EHR medication list and Patient reporting .  Confirmed patient is taking all newly prescribed medications as instructed (any discrepancies are noted in review section)   Patient / Caregiver is aware of any changes to and / or  any dosage adjustments to medication regimen. Patient/ Caregiver denies questions at this time and reports no barriers to medication adherence.    [employer] Platform For additional details of TOC call  Please see full Transition of Care ( TOC) visit note and assessment documented in  [employer]   Medications Reviewed Today: Medications Reviewed Today     Reviewed by Johnnette Barrios, RN (Registered Nurse) on 05/24/23 at 1031  Med List Status: <None>   Medication Order Taking? Sig Documenting Provider Last Dose Status Informant  albuterol (PROVENTIL) (2.5 MG/3ML) 0.083% nebulizer solution [id number] Yes Take 2.5 mg by nebulization every 6 (six) hours as needed for wheezing or shortness of breath. [provider] Taking Active Self  albuterol (VENTOLIN HFA) 108 (90 Base) MCG/ACT inhaler [id number] Yes Inhale 2 puffs into the lungs every 4 (four) hours as needed for wheezing or shortness of breath. Nyoka Cowden, MD Taking Active Self  aspirin EC 81 MG tablet [id number] Yes Take 81 mg by mouth daily. [provider] Taking Active Self   Budeson-Glycopyrrol-Formoterol (BREZTRI AEROSPHERE) 160-9-4.8 MCG/ACT AERO [id number] Yes Inhale 2 puffs into the lungs 2 (two) times daily. Take 2 puffs first thing in am and then another 2 puffs about 12 hours later.  Patient taking differently: Inhale 2 puffs into the lungs 2 (two) times daily as needed (shortness of breath). Nyoka Cowden, MD Taking Active Self  cholecalciferol (VITAMIN D3) 25 MCG (1000 UNIT) tablet [id number] Yes Take 1,000 Units by mouth daily. [provider] Taking Active Self  Multiple Vitamin (MULTIVITAMIN WITH MINERALS) TABS [id number] Yes Take 1 tablet by mouth daily. [provider] Taking Active Self  nitroGLYCERIN (NITROSTAT) 0.4 MG SL tablet [id number] Yes Place 1 tablet (0.4 mg total) under the tongue every 5 (five) minutes x 3 doses as needed for chest pain (If no relief after 3rd dose proceed to ED or call 911). Mallipeddi, Orion Modest, MD Taking Active Self  oxyCODONE (OXY IR/ROXICODONE) 5 MG immediate release tablet [id number] Yes Take 1 tablet (5 mg total) by mouth every 4 (four) hours as needed for moderate pain (pain score 4-6). McKenzie, Mardene Celeste, MD Taking Active   rosuvastatin (CRESTOR) 10 MG tablet [id number] Yes Take 1 tablet (10 mg total) by mouth daily. Mallipeddi, Vishnu P, MD Taking Active Self           Medication reconciliation / review completed based on most recent discharge summary and EHR medication list. Confirmed patient is taking all newly prescribed medications as instructed (any discrepancies are noted in review section)   Patient / Caregiver is aware of any changes to and / or  any dosage adjustments to medication regimen. Patient/ Caregiver denies questions at  this time and reports no barriers to medication adherence.   Updates Reviewed with patient START taking: oxyCODONE (Oxy IR/ROXICODONE) Take 1 tablet (5 mg total) by mouth every 4 (four) hours as needed for moderate pain (pain score 4-6)   James Mcardle ,  BSN, RN [facility]   VBCI-Population Health RN Care Manager Direct Dial [phone]  Fax: [phone] Website: [email or web address]

## 2023-05-31 ENCOUNTER — Ambulatory Visit (HOSPITAL_COMMUNITY)
Admission: RE | Admit: 2023-05-31 | Discharge: 2023-05-31 | Disposition: A | Discharge status: Home / Self Care | Source: Ambulatory Visit | Attending: Urology | Admitting: Urology

## 2023-05-31 ENCOUNTER — Telehealth: Payer: Self-pay | Admitting: Urology

## 2023-05-31 DIAGNOSIS — C661 Malignant neoplasm of right ureter: Secondary | ICD-10-CM

## 2023-05-31 DIAGNOSIS — Z435 Encounter for attention to cystostomy: Secondary | ICD-10-CM | POA: Diagnosis not present

## 2023-05-31 MED ORDER — IOTHALAMATE MEGLUMINE 17.2 % UR SOLN
250.0000 mL | Freq: Once | URETHRAL | Status: AC | PRN
  Administered 2023-05-31: 250 mL via INTRAVESICAL

## 2023-05-31 NOTE — Telephone Encounter (Signed)
 I called patient, she states she is no longer having the pain.  She states this was immediately after her surgery.  Patient called an after hours triage line and spoke with a nurse.  They recommended her be seen at the ER or Urgent care.  Patient states shortly after she spoke with triage she took [place] and her pain stopped and she is doing fine now.  I informed her to call back or go back to the ER if her pain returns.  She voiced understanding and will follow up as scheduled.

## 2023-05-31 NOTE — Telephone Encounter (Signed)
 Patient left message she had surgery and is having severe abdominal pain and vomiting

## 2023-05-31 NOTE — Telephone Encounter (Signed)
 Cystogram needed prior to follow up, imaging is not scheduled at this time.  Message sent to referral coordinator for an update.

## 2023-06-01 NOTE — Discharge Summary (Signed)
 Physician Discharge Summary  Patient ID: Karen Church MRN: [id number] DOB/AGE: 05-27-1950 73 y.o.  Admit date: 05/17/2023 Discharge date: 05/21/2023  Admission Diagnoses:  Transitional cell carcinoma of kidney, right [facility])  Discharge Diagnoses:  Principal Problem:   Transitional cell carcinoma of kidney, right (HCC) Active Problems:   Malignant neoplasm of right ureter [facility])   Past Medical History:  Diagnosis Date   Arthritis    Carpal tunnel syndrome    COPD, severe (HCC)    pulmolonogy--- dr Waymond Hailey;  GOLD III; pt still smokes;  no oxygen  at home;    last exacerbation w/ hypoxia @ [place] urgent care 11-01-2022 O2 83-87% refused to go to ED, given steroid/ anticiotic/ nebs/ f/u 1-2 w/ pulm12-13-2021 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 female who was admitted 05/17/2023 with a diagnosis of Transitional cell carcinoma of kidney, right (HCC) and went to the operating room on 05/17/2023 and underwent the above named procedures.    She was given perioperative antibiotics:  Anti-infectives (From admission, onward)    Start     Dose/Rate Route Frequency Ordered Stop   05/17/23 1600  ceFAZolin  (ANCEF ) IVPB 2g/100 mL premix        2 g 200 mL/hr over 30 Minutes Intravenous Every 8 hours 05/17/23 1211 05/18/23 0839   05/17/23 0630  ceFAZolin  (ANCEF ) 2-4 GM/100ML-% IVPB       Note to Pharmacy: Lind Repine S: cabinet override      05/17/23 0630 05/17/23 0803   05/17/23 0628  ceFAZolin  (ANCEF ) IVPB 2g/100 mL premix        2 g 200 mL/hr over 30 Minutes Intravenous 30 min pre-op 05/17/23 0628 05/17/23  0802     .  She was given sequential compression devices, early ambulation for DVT prophylaxis.  She benefited maximally from the hospital stay and there were no complications.    Recent vital signs:  Vitals:   05/21/23 0448 05/21/23 1259  BP: (!) 156/65 (!) 136/90  Pulse: 74 84  Resp: 18 16  Temp: 98 F (36.7 C)   SpO2: 97% 100%    Recent laboratory studies:  Lab Results  Component Value Date   HGB 12.5 05/18/2023   HGB 13.1 05/17/2023   HGB 14.9 05/12/2023   Lab Results  Component Value Date   WBC 11.7 (H) 05/18/2023   PLT 174 05/18/2023   Lab Results  Component Value Date   INR 1.1 01/05/2008   Lab Results  Component Value Date   NA 140 05/18/2023   K 4.5 05/18/2023   CL 102 05/18/2023   CO2 30 05/18/2023   BUN 17 05/18/2023   CREATININE 1.44 (H) 05/18/2023   GLUCOSE 103 (H) 05/18/2023    Discharge Medications:   Allergies as of 05/21/2023       Reactions   Lasix [furosemide] Swelling        Medication List     TAKE these medications    albuterol  (2.5 MG/3ML) 0.083% nebulizer solution Commonly known as: PROVENTIL  Take 2.5 mg by nebulization every 6 (six) hours as needed for wheezing or shortness of breath.  albuterol  108 (90 Base) MCG/ACT inhaler Commonly known as: VENTOLIN  HFA Inhale 2 puffs into the lungs every 4 (four) hours as needed for wheezing or shortness of breath.   aspirin  EC 81 MG tablet Take 81 mg by mouth daily.   Breztri  Aerosphere 160-9-4.8 MCG/ACT Aero inhaler Generic drug: budeson-glycopyrrolate -formoterol  Inhale 2 puffs into the lungs 2 (two) times daily. Take 2 puffs first thing in am and then another 2 puffs about 12 hours later. What changed:  when to take this reasons to take this additional instructions   cholecalciferol 25 MCG (1000 UNIT) tablet Commonly known as: VITAMIN D3 Take 1,000 Units by mouth daily.   multivitamin with minerals Tabs tablet Take 1 tablet by mouth daily.   nitroGLYCERIN  0.4 MG SL  tablet Commonly known as: Nitrostat  Place 1 tablet (0.4 mg total) under the tongue every 5 (five) minutes x 3 doses as needed for chest pain (If no relief after 3rd dose proceed to ED or call 911).   oxyCODONE  5 MG immediate release tablet Commonly known as: Oxy IR/ROXICODONE  Take 1 tablet (5 mg total) by mouth every 4 (four) hours as needed for moderate pain (pain score 4-6).   rosuvastatin  10 MG tablet Commonly known as: CRESTOR  Take 1 tablet (10 mg total) by mouth daily.        Diagnostic Studies: Cystogram Result Date: 05/31/2023 CLINICAL DATA:  Patient with history of right low grade ureteral transitional cell carcinoma s/p right radical nephroureterectomy 05/17/23 who presents today for cystogram to assess for post operative leak prior to removal of urinary catheter. Per patient she has been urinating around the catheter and also having urine drain into the bag. EXAM: CYSTOGRAM TECHNIQUE: The bladder was filled with 250 mL Cysto-Hypaque 30% by drip infusion via indwelling urinary catheter. Serial spot images were obtained during bladder filling and post draining in supine AP, right lateral, RPO, left lateral and LPO positions. FLUOROSCOPY: Radiation Exposure Index (as provided by the fluoroscopic device): 46.80 mGy Kerma COMPARISON:  CT chest/abd/pelvis w/contrast 10/30/22 FINDINGS: The bladder is oval shaped without evidence of filling defect. No evidence of diverticula or masses. No evidence of contrast extravasation in AP, lateral or steep oblique views. No reflux into left ureter. IMPRESSION: No contrast extravasation from the urinary bladder. Electronically Signed   By: Creasie Doctor M.D.   On: 05/31/2023 17:02    Disposition: Discharge disposition: 01-Home or Self Care       Discharge Instructions     Discharge patient   Complete by: As directed    Discharge disposition: 01-Home or Self Care   Discharge patient date: 05/21/2023   Discharge patient   Complete by: As directed     Discharge disposition: 01-Home or Self Care   Discharge patient date: 05/21/2023        Follow-up Information     Antionette Luster, Arden Beck, MD. Call in 2 week(s).   Specialty: Urology Contact information: [address]  [place] [address] [phone]                  Signed: Johnie Nailer 06/01/2023, 9:26 AM

## 2023-06-02 ENCOUNTER — Ambulatory Visit: Payer: Medicare HMO | Admitting: Urology

## 2023-06-02 ENCOUNTER — Encounter: Payer: Self-pay | Admitting: Urology

## 2023-06-02 VITALS — BP 133/69 | HR 89

## 2023-06-02 DIAGNOSIS — C661 Malignant neoplasm of right ureter: Secondary | ICD-10-CM

## 2023-06-02 NOTE — Progress Notes (Signed)
 Catheter Removal  Patient is present today for a catheter removal per MD order  10ml of water  was drained from the balloon. A 20FR foley cath was removed from the bladder, no complications were noted. Patient tolerated well.  Performed by: Wyonia Hefty, CMA  Patient here today for staples removal. 18 staples visualized, in total.  13 staples removed today per MD order. Steri strips applied . No complications were noted. incision site  covered Patient tolerated staple removal with no issues. Patient will keep followup  1 week for

## 2023-06-02 NOTE — Patient Instructions (Signed)

## 2023-06-02 NOTE — Progress Notes (Signed)
 06/02/2023 9:08 AM   Karen Church August 07, 1950 [id number]  Referring provider: Omie Bickers, MD [address] Karen Church,  [place] [address]  No chief complaint on file.   HPI: Karen Church is a 73yo here for followup for right ureteral TCC. Pathology T1 ureteral TCC. Bladder tumor resection showed TaG3 bladder cancer. 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   Dyspnea    Hydronephrosis of right kidney    Melanoma (HCC)    R arm   Pneumonia    PONV (postoperative nausea and vomiting)     Surgical History: Past Surgical History:  Procedure Laterality Date   ABDOMINAL HYSTERECTOMY     BALLOON DILATION N/A 08/11/2021   Procedure: BALLOON DILATION;  Surgeon: Karen Greening, DO;  [facility];  Service: Endoscopy;  Laterality: N/A;   BIOPSY  12/10/2015   Procedure: BIOPSY;  Surgeon: Karen Espy, MD;  [facility];  Service: Endoscopy;;  Gastric    BIOPSY  08/11/2021   Procedure: BIOPSY;  Surgeon: Karen Greening, DO;  [facility];  Service: Endoscopy;;  gastric    CESAREAN SECTION     COLONOSCOPY N/A 12/10/2015   normal   CYSTOSCOPY N/A 05/17/2023   Procedure: CYSTOSCOPY;  Surgeon: Karen Severs, MD;  Location: [facility] OR[facility];  Service: Urology;  Laterality: N/A;   CYSTOSCOPY WITH RETROGRADE PYELOGRAM, URETEROSCOPY AND STENT PLACEMENT Bilateral 03/05/2023   Procedure: CYSTOSCOPY WITH BILATERAL RETROGRADE PYELOGRAM, RIGHT URETEROSCOPY WITH BIOPSY AND STENT PLACEMENT;  Surgeon: Karen Coyer, MD;  Location: [facility];  Service: Urology;  Laterality: Bilateral;   ESOPHAGOGASTRODUODENOSCOPY  N/A 12/10/2015   normal esophagus s/p dilation, +H.pylori   ESOPHAGOGASTRODUODENOSCOPY (EGD) WITH PROPOFOL  N/A 08/11/2021   Procedure: ESOPHAGOGASTRODUODENOSCOPY (EGD) WITH PROPOFOL ;  Surgeon: Karen Greening, DO;  [facility];  Service: Endoscopy;  Laterality: N/A;  2:15pm   MALONEY DILATION N/A 12/10/2015   Procedure: Karen Church DILATION;  Surgeon: Karen Espy, MD;  [facility];  Service: Endoscopy;  Laterality: N/A;   ROBOT ASSITED LAPAROSCOPIC NEPHROURETERECTOMY Right 05/17/2023   Procedure: NEPHROURETERECTOMY, ROBOT-ASSISTED, LAPAROSCOPIC;  Surgeon: Karen Severs, MD;  Location: [facility];  Service: Urology;  Laterality: Right;   SKIN CANCER EXCISION     SMALL INTESTINE SURGERY N/A 1967   Resection   TRANSURETHRAL RESECTION OF BLADDER TUMOR N/A 05/17/2023   Procedure: TURBT (TRANSURETHRAL RESECTION OF BLADDER TUMOR);  Surgeon: Karen Severs, MD;  Location[facility];  Service: Urology;  Laterality: N/A;    Home Medications:  Allergies as of 06/02/2023       Reactions   Lasix [furosemide] Swelling        Medication List        Accurate as of June 02, 2023  9:08 AM. If you have any questions, ask your nurse or doctor.          albuterol  (2.5 MG/3ML) 0.083% nebulizer solution Commonly known as: PROVENTIL  Take 2.5 mg by nebulization every 6 (six) hours as needed for wheezing or shortness of breath.  albuterol  108 (90 Base) MCG/ACT inhaler Commonly known as: VENTOLIN  HFA Inhale 2 puffs into the lungs every 4 (four) hours as needed for wheezing or shortness of breath.   aspirin  EC 81 MG tablet Take 81 mg by mouth daily.   Breztri  Aerosphere 160-9-4.8 MCG/ACT Aero inhaler Generic drug: budeson-glycopyrrolate -formoterol  Inhale 2 puffs into the lungs 2 (two) times daily. Take 2 puffs first thing in am and then another 2 puffs about 12 hours later. What changed:  when to take this reasons to take this additional instructions   cholecalciferol  25 MCG (1000 UNIT) tablet Commonly known as: VITAMIN D3 Take 1,000 Units by mouth daily.   multivitamin with minerals Tabs tablet Take 1 tablet by mouth daily.   nitroGLYCERIN  0.4 MG SL tablet Commonly known as: Nitrostat  Place 1 tablet (0.4 mg total) under the tongue every 5 (five) minutes x 3 doses as needed for chest pain (If no relief after 3rd dose proceed to ED or call 911).   oxyCODONE  5 MG immediate release tablet Commonly known as: Oxy IR/ROXICODONE  Take 1 tablet (5 mg total) by mouth every 4 (four) hours as needed for moderate pain (pain score 4-6).   rosuvastatin  10 MG tablet Commonly known as: CRESTOR  Take 1 tablet (10 mg total) by mouth daily.        Allergies:  Allergies  Allergen Reactions   Lasix [Furosemide] Swelling    Family History: Family History  Problem Relation Age of Onset   Colon polyps Sister    Colon polyps Brother    Colon cancer Neg Hx     Social History:  reports that she has been smoking cigarettes. She has a 25 pack-year smoking history. She has been exposed to tobacco smoke. She has never used smokeless tobacco. She reports that she does not drink alcohol and does not use drugs.  ROS: All other review of systems were reviewed and are negative except what is noted above in HPI  Physical Exam: BP 133/69   Pulse 89   Constitutional:  Alert and oriented, No acute distress. HEENT: [place] AT, moist mucus membranes.  Trachea midline, no masses. Cardiovascular: No clubbing, cyanosis, or edema. Respiratory: Normal respiratory effort, no increased work of breathing. GI: Abdomen is soft, nontender, nondistended, no abdominal masses GU: No CVA tenderness.  Lymph: No cervical or inguinal lymphadenopathy. Skin: No rashes, bruises or suspicious lesions. Neurologic: Grossly intact, no focal deficits, moving all 4 extremities. Psychiatric: Normal mood and affect.  Laboratory Data: Lab Results  Component Value Date   WBC 11.7 (H) 05/18/2023    HGB 12.5 05/18/2023   HCT 38.8 05/18/2023   MCV 101.6 (H) 05/18/2023   PLT 174 05/18/2023    Lab Results  Component Value Date   CREATININE 1.44 (H) 05/18/2023    No results found for: "PSA"  No results found for: "TESTOSTERONE"  No results found for: "HGBA1C"  Urinalysis    Component Value Date/Time   APPEARANCEUR Clear 03/15/2023 1345   GLUCOSEU Negative 03/15/2023 1345   BILIRUBINUR negative 04/02/2023 1507   BILIRUBINUR Negative 03/15/2023 1345   PROTEINUR Positive (A) 04/02/2023 1507   PROTEINUR 1+ (A) 03/15/2023 1345   UROBILINOGEN 0.2 04/02/2023 1507   NITRITE negative 04/02/2023 1507   NITRITE Negative 03/15/2023 1345   LEUKOCYTESUR Moderate (2+) (A) 04/02/2023 1507   LEUKOCYTESUR 1+ (A) 03/15/2023 1345    Lab Results  Component Value Date   LABMICR See below: 03/15/2023   WBCUA 6-10 (A) 03/15/2023   LABEPIT 0-10 03/15/2023  BACTERIA None seen 03/15/2023    Pertinent Imaging:  No results found for this or any previous visit.  No results found for this or any previous visit.  No results found for this or any previous visit.  No results found for this or any previous visit.  No results found for this or any previous visit.  No results found for this or any previous visit.  No results found for this or any previous visit.  No results found for this or any previous visit.   Assessment & Plan:    1. Ureteral cancer, right (HCC) (Primary) Staple removal today Followup 3 months for cystoscopy - Foley catheter - discontinue   No follow-ups on file.  Johnie Nailer, MD  [facility] [place]

## 2023-06-03 ENCOUNTER — Other Ambulatory Visit: Payer: Self-pay

## 2023-06-03 ENCOUNTER — Telehealth: Payer: Self-pay

## 2023-06-10 ENCOUNTER — Telehealth: Payer: Self-pay

## 2023-06-10 NOTE — Transitions of Care (Post Inpatient/ED Visit) (Signed)
 06/10/2023  Patient ID: Karen Church, female   DOB: 1950/08/16, 73 y.o.   MRN: [id number]  Medication Review for transitions of care.  See [employer] for documentation.     Sabri Teal J. Clell Trahan RN, MSN [facility], [facility] RN Care Manager Direct Dial: [phone]  Fax: [phone] Website: [email or web address]

## 2023-06-11 ENCOUNTER — Ambulatory Visit (INDEPENDENT_AMBULATORY_CARE_PROVIDER_SITE_OTHER)

## 2023-06-11 DIAGNOSIS — C661 Malignant neoplasm of right ureter: Secondary | ICD-10-CM

## 2023-06-11 NOTE — Progress Notes (Signed)
 Patient here today for staples removal. 5 staples visualized, in total.  5 staples removed today per MD order. Steri strips applied . No complications were noted. incision site  healing well Patient tolerated staple removal with no issues. Patient will keep followup  per MD McKenzie in 3 months for  Cysto

## 2023-06-17 ENCOUNTER — Telehealth: Payer: Self-pay

## 2023-06-17 NOTE — Transitions of Care (Post Inpatient/ED Visit) (Signed)
 06/17/2023  Patient ID: Karen Church, female   DOB: 1950-12-20, 73 y.o.   MRN: [id number]  Medication Review for transitions of care.  See [employer] for documentation.  [name] J. Jaymien Landin RN, MSN [facility], [facility] RN Care Manager Direct Dial: [phone]  Fax: [phone] Website: [email or web address]

## 2023-06-24 ENCOUNTER — Telehealth: Payer: Self-pay

## 2023-06-24 DIAGNOSIS — C661 Malignant neoplasm of right ureter: Secondary | ICD-10-CM

## 2023-06-24 DIAGNOSIS — J449 Chronic obstructive pulmonary disease, unspecified: Secondary | ICD-10-CM

## 2023-06-24 NOTE — Transitions of Care (Post Inpatient/ED Visit) (Signed)
 06/24/2023  Patient ID: Karen Church, female   DOB: [birth year], 73 y.o.   MRN: [id number]  Medication review for transitions of care  [name] RN, MSN [facility]  [facility], [facility] RN Care Manager Direct Dial: [phone]  Fax: [phone] Website: [email or web address]

## 2023-06-24 NOTE — Transitions of Care (Post Inpatient/ED Visit) (Signed)
 06/24/2023  Patient ID: Karen Church, female   DOB: 06/03/1950, 73 y.o.   MRN: [id number]  Situation: Patient has completed 30-day TOC program.    Background:  Patient with recent right Ureter cancer with surgical intervention.  Surgical wound healed but patient has decreased appetite weight on home scale 97 lbs. Patient reports she eats what she wants.  She drinks supplements.  Patient also has COPD.      Assessment: Patient has completed the transition of care program for recent hospitalization. However, patient has chronic condition COPD and would benefit from Complex Care Management.  Patient  agreeable.  Recommendation: RN CM referring to Complex Care Management. Transitions of care documentation in [employer].  Joey Lierman J. Marquisha Nikolov RN, MSN [facility], [facility] RN Care Manager Direct Dial: [phone]  Fax: [phone] Website: [email or web address]

## 2023-06-25 ENCOUNTER — Telehealth: Payer: Self-pay | Admitting: *Deleted

## 2023-06-25 NOTE — Progress Notes (Signed)
 Complex Care Management Note Care Guide Note  06/25/2023 Name: Karen Church MRN: [id number] DOB: July 18, 1950   Complex Care Management Outreach Attempts: An unsuccessful telephone outreach was attempted today to offer the patient information about available complex care management services.  Follow Up Plan:  Additional outreach attempts will be made to offer the patient complex care management information and services.   Encounter Outcome:  No Answer  Kandis Ormond, CMA [place]  [facility], [facility] Guide Direct Dial: [phone]  Fax: [phone] Website: [email or web address]

## 2023-06-28 NOTE — Progress Notes (Signed)
 Complex Care Management Note Care Guide Note  06/28/2023 Name: Karen Church MRN: [id number] DOB: 1951/01/22   Complex Care Management Outreach Attempts: A second unsuccessful outreach was attempted today to offer the patient with information about available complex care management services.  Follow Up Plan:  Additional outreach attempts will be made to offer the patient complex care management information and services.   Encounter Outcome:  No Answer  Kandis Ormond, CMA [place]  [facility], [facility] Guide Direct Dial: [phone]  Fax: [phone] Website: [email or web address]

## 2023-06-29 NOTE — Progress Notes (Signed)
 Complex Care Management Note Care Guide Note  06/29/2023 Name: Karen Church MRN: [id number] DOB: [birth year]   Complex Care Management Outreach Attempts: A third unsuccessful outreach was attempted today to offer the patient with information about available complex care management services.  Follow Up Plan:  No further outreach attempts will be made at this time. We have been unable to contact the patient to offer or enroll patient in complex care management services.  Encounter Outcome:  No Answer  Kandis Ormond, CMA [place]  [facility], [facility] Guide Direct Dial: [phone]  Fax: [phone] Website: [email or web address]

## 2023-06-29 NOTE — Progress Notes (Signed)
 Complex Care Management Note  Care Guide Note 06/29/2023 Name: Karen Church MRN: [id number] DOB: 06-Jun-1950  Karen Church is a 73 y.o. year old female who sees Del Favia, Lethia Raveling, MD for primary care. I reached out to [name] by phone today to offer complex care management services.  [name] was given information about Complex Care Management services today including:   The Complex Care Management services include support from the care team which includes your Nurse Care Manager, Clinical Social Worker, or Pharmacist.  The Complex Care Management team is here to help remove barriers to the health concerns and goals most important to you. Complex Care Management services are voluntary, and the patient may decline or stop services at any time by request to their care team member.   Complex Care Management Consent Status: Patient agreed to services and verbal consent obtained.   Follow up plan:  Telephone appointment with complex care management team member scheduled for:  07/07/2023  Encounter Outcome:  Patient Scheduled  Kandis Ormond, CMA [place]  [facility], [facility] Guide Direct Dial: [phone]  Fax: [phone] Website: [email or web address]

## 2023-07-07 ENCOUNTER — Other Ambulatory Visit: Payer: Self-pay | Admitting: *Deleted

## 2023-07-07 ENCOUNTER — Encounter: Payer: Self-pay | Admitting: *Deleted

## 2023-07-13 ENCOUNTER — Telehealth: Payer: Self-pay

## 2023-07-13 NOTE — Progress Notes (Signed)
 Complex Care Management Note  Care Guide Note 07/13/2023 Name: Karen Church MRN: [id number] DOB: 1951-01-01  Karen Church is a 73 y.o. year old female who sees Del Favia, Lethia Raveling, MD for primary care. I reached out to [name] by phone today to offer complex care management services.  [name] was given information about Complex Care Management services today including:   The Complex Care Management services include support from the care team which includes your Nurse Care Manager, Clinical Social Worker, or Pharmacist.  The Complex Care Management team is here to help remove barriers to the health concerns and goals most important to you. Complex Care Management services are voluntary, and the patient may decline or stop services at any time by request to their care team member.   Complex Care Management Consent Status: Patient agreed to services and verbal consent obtained.   Follow up plan:  Telephone appointment with complex care management team member scheduled for:  07-14-23  Encounter Outcome:  Patient Scheduled   Creola Doheny [facility], [facility] Guide  Direct Dial: [phone]  Fax [phone]

## 2023-07-13 NOTE — Progress Notes (Signed)
 Complex Care Management Care Guide Note  07/13/2023 Name: Karen Church MRN: [id number] DOB: [birth year]  Karen Church is a 73 y.o. year old female who is a primary care patient of Del Favia, Lethia Raveling, MD and is actively engaged with the care management team. I reached out to [name] by phone today to assist with re-scheduling  with the RN Case Manager.  Follow up plan: Unsuccessful telephone outreach attempt made. A HIPAA compliant phone message was left for the patient providing contact information and requesting a return call.  [name], [name] Guide  Direct Dial: [phone]  Fax [phone]

## 2023-07-14 ENCOUNTER — Encounter: Payer: Self-pay | Admitting: *Deleted

## 2023-07-14 ENCOUNTER — Other Ambulatory Visit: Payer: Self-pay | Admitting: *Deleted

## 2023-07-14 ENCOUNTER — Other Ambulatory Visit: Payer: Self-pay

## 2023-07-14 NOTE — Patient Instructions (Addendum)
 Visit Information  Thank you for taking time to visit with me today. Please don't hesitate to contact me if I can be of assistance to you before our next scheduled appointment.  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 Per patient she has been urinating around the catheter and also having urine drain into the bag.  TECHNIQUE: The bladder was filled with 250 mL Cysto-Hypaque 30% by drip infusion via indwelling urinary catheter. Serial spot images were obtained during bladder filling and post draining in supine AP, right lateral, RPO, left lateral and LPO positions.  FLUOROSCOPY: Radiation Exposure Index (as provided by the fluoroscopic device): 46.80 mGy Kerma   FINDINGS: The bladder is oval shaped without evidence of filling defect. No evidence of diverticula or masses. No evidence of contrast extravasation in AP, lateral or steep oblique views. No reflux into left ureter.  IMPRESSION: No contrast extravasation from the urinary bladder. COMPARISON:  CT chest/abd/pelvis w/contrast 10/30/22  IMPRESSION: 1. Continued stability of multiple lung nodules the largest in the left apex measuring 1 cm. 2. Emphysematous scarring. 3. Right-side (Kidney) hydronephrosis (kidney swollen because blockage) with a possible mass in the proximal right ureter. 4. Right kidney cyst. 5. Aortic atherosclerosis.    Our next appointment is by telephone on 07/28/23 at 1:45 pm Please call the care guide team at [phone] if you need to cancel or reschedule your appointment.   Following is a copy of your care plan:   Goals Addressed             This Visit's Progress    Increase weight & become stronger-VBCI RN Care Plan   Worsening     Problems:  Chronic Disease Management support and education needs related to loss of weight & Strength after ureter cancer diagnosis & treatment Preventive care gaps   Goal: Over the next 90 days the Patient will demonstrate Improved health management independence as evidenced by increase weight values over the next 30 days the patient will receive an appetite stimulant order as needed Over the next 45 days patient will have appointments for preventive care gaps         Interventions:   Weight Loss Interventions: 07/28/23 Update from urology pending Patient updated and encouraged to take in high protein foods or oral supplements    Assessed for preventive care gaps Discussed the importance of preventive care interventions/appointments Avs education provided for high protein foods/diet, preventive care for 65+ visual disturbances   Patient Self-Care Activities:  Attend all scheduled provider appointments Attend church or other social activities Call pharmacy for medication refills 3-7 days in advance of running out of medications Call provider office for new concerns or questions  Take medications as prescribed   call doctor when you experience any new symptoms Make appointment for an eye exam  Plan:  Telephone follow up appointment with care management team member scheduled for:  08/11/23 2 pm  The patient has been provided with contact information for the care management team and has been advised to call with any health related questions or concerns.         Please call the Suicide and Crisis Lifeline: 988 call the [place]  National Suicide Prevention Lifeline: 269-533-7193 or TTY: 539-254-0955 TTY 214-286-7956) to talk to  a trained counselor call 1-800-273-TALK (toll free, 24 hour hotline) call the [facility]: [phone] call 911 if you are experiencing a Mental Health or Behavioral Health Crisis or need someone to talk to.  Patient verbalizes understanding  of instructions and care plan provided today and agrees to view in MyChart. Active MyChart status and patient understanding of how to access instructions and care plan via MyChart confirmed with patient.     Saw Mendenhall L. Ramonita, RN, BSN, CCM [place]  [facility], [facility] Health RN Care Manager Direct Dial: [phone]  Fax: [phone]

## 2023-07-14 NOTE — Patient Outreach (Signed)
 Complex Care Management   Visit Note  07/14/2023  Name:  Karen Church MRN: [id number] DOB: 08/31/1950  Situation: Referral received for Complex Care Management related to post cancer weakness, poor appetite after cancer of ureter treatment I obtained verbal consent from Patient.  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   Dyspnea    Hydronephrosis of right kidney    Melanoma (HCC)    R arm   Pneumonia    PONV (postoperative nausea and vomiting)     Assessment: Patient Reported Symptoms:  Cognitive Cognitive Status: Alert and oriented to person, place, and time, Insightful and able to interpret abstract concepts, Normal speech and language skills   Health Maintenance Behaviors: Annual physical exam, Hobbies, Sleep adequate, Social activities, Spiritual practice(s) Healing Pattern: Average Health Facilitated by: Pain control, Prayer/meditation, Rest  Neurological Neurological Review of Symptoms: Weakness Neurological Management Strategies: Adequate rest, Routine screening Neurological Self-Management Outcome: 4 (good)  HEENT HEENT Symptoms Reported: No symptoms reported HEENT Management Strategies: Adequate rest, Routine screening HEENT Self-Management Outcome: 4 (good)    Cardiovascular Cardiovascular Symptoms Reported: Other: Other Cardiovascular Symptoms: cramps Weight: 97 lb (44 kg)  Respiratory Respiratory Symptoms Reported: Productive cough Other Respiratory Symptoms: reports thick clear sputum when she awakes in the mornings- Resolved after drinking coffee Respiratory Conditions:  COPD Respiratory Self-Management Outcome: 4 (good) Respiratory Comment: reports a history of pneumonia  Endocrine Patient reports the following symptoms related to hypoglycemia or hyperglycemia : Weakness or fatigue, Weight loss Is patient diabetic?: No Endocrine Conditions: Other Other Endocrine Conditions: prediabetes- Noted to have some high glucose values in lst few years 120-140s Endocrine Management Strategies: Adequate rest, Coping strategies, Routine screening Endocrine Self-Management Outcome: 4 (good)  Gastrointestinal Gastrointestinal Symptoms Reported: Change in appetite Additional Gastrointestinal Details: Reports having a brother younger than her to pass with cancer Gastrointestinal Conditions: Abdominal pain, Nausea Nutrition Risk Screen (CP): Reduced oral intake over the last month  Genitourinary Genitourinary Symptoms Reported: No symptoms reported Genitourinary Conditions: Kidney cancer, Urinary tract infection, Bloody urine, Burning/pain Genitourinary Management Strategies: Adequate rest, Coping strategies Genitourinary Self-Management Outcome: 3 (uncertain)  Integumentary Integumentary Symptoms Reported: Skin changes Additional Integumentary Details: reports sagging skin with weight loss Skin Conditions: Other Other Skin Conditions: multiple actinic keratoses Skin Management Strategies: Adequate rest, Coping strategies, Routine screening Skin Self-Management Outcome: 4 (good)  Musculoskeletal Musculoskelatal Symptoms Reviewed: Weakness, Other, Muscle pain Other Musculoskeletal Symptoms: leg cramps had one last week Musculoskeletal Conditions: Other, Joint pain Other Musculoskeletal Conditions: weaker after cancer treatment & poor appette- has pain of right extremity, hip Musculoskeletal Management Strategies: Adequate rest, Routine screening Musculoskeletal Self-Management Outcome: 3 (uncertain) Falls in the past year?: No Number of falls in past year: 1 or less Was  there an injury with Fall?: No Fall Risk Category Calculator: 0 Patient Fall Risk Level: Low Fall Risk Patient at Risk for Falls Due to: Impaired mobility (weaker unable to walk long distances) Fall risk Follow up: Falls evaluation completed  Psychosocial Psychosocial Symptoms Reported: Anxiety - if selected complete GAD, Depression - if  selected complete PHQ 2-9, Phobias Additional Psychological Details: she confirms she feels like she is having panic attacks when lying flat, therefore she sleeps in her recliner Behavioral Management Strategies: Activity, Adequate rest, Support system, Coping strategies Behavioral Health Self-Management Outcome: 3 (uncertain)   Quality of Family Relationships: helpful, involved, supportive Do you feel physically threatened by others?: No      07/14/2023    1:59 PM  Depression screen PHQ 2/9  Decreased Interest 0  Down, Depressed, Hopeless 1  PHQ - 2 Score 1    There were no vitals filed for this visit.  Medications Reviewed Today     Reviewed by Arlyce Berger, RN (Registered Nurse) on 07/14/23 at 1643  Med List Status: <None>   Medication Order Taking? Sig Documenting Provider Last Dose Status Informant  albuterol  (PROVENTIL ) (2.5 MG/3ML) 0.083% nebulizer solution [id number] Yes Take 2.5 mg by nebulization every 6 (six) hours as needed for wheezing or shortness of breath. [provider] Taking Active Self  albuterol  (VENTOLIN  HFA) 108 (90 Base) MCG/ACT inhaler [id number] Yes Inhale 2 puffs into the lungs every 4 (four) hours as needed for wheezing or shortness of breath. Diamond Formica, MD Taking Active Self  aspirin  EC 81 MG tablet [id number] Yes Take 81 mg by mouth daily. [provider] Taking Active Self  Budeson-Glycopyrrol-Formoterol  (BREZTRI  AEROSPHERE) 160-9-4.8 MCG/ACT AERO [id number] Yes Inhale 2 puffs into the lungs 2 (two) times daily. Take 2 puffs first thing in am and then another 2 puffs about 12 hours later.  Patient  taking differently: Inhale 2 puffs into the lungs 2 (two) times daily as needed (shortness of breath). Diamond Formica, MD Taking Active Self  cholecalciferol (VITAMIN D3) 25 MCG (1000 UNIT) tablet [id number] Yes Take 1,000 Units by mouth daily. [provider] Taking Active Self  Multiple Vitamin (MULTIVITAMIN WITH MINERALS) TABS [id number] Yes Take 1 tablet by mouth daily. [provider] Taking Active Self  nitroGLYCERIN  (NITROSTAT ) 0.4 MG SL tablet [id number] Yes Place 1 tablet (0.4 mg total) under the tongue every 5 (five) minutes x 3 doses as needed for chest pain (If no relief after 3rd dose proceed to ED or call 911). Mallipeddi, Kennyth Pean, MD Taking Active Self  oxyCODONE  (OXY IR/ROXICODONE ) 5 MG immediate release tablet [id number] No Take 1 tablet (5 mg total) by mouth every 4 (four) hours as needed for moderate pain (pain score 4-6).  Patient not taking: Reported on 06/03/2023   Marco Severs, MD Not Taking Active   rosuvastatin  (CRESTOR ) 10 MG tablet [id number] Yes Take 1 tablet (10 mg total) by mouth daily. Mallipeddi, Kennyth Pean, MD Taking Active Self  Spacer/Aero-Holding Idelle Majors (AEROCHAMBER PLS FLOVU MTHPIECE) DEVI [id number] Yes See admin instructions. [provider] Taking Active             Recommendation:   08/09/23 PCP Follow-up Continue Current Plan of Care Appetite stimulant Rx request from pcp When patient does have the urge to eat, take in high protein foods.    Follow Up Plan:   Telephone follow up appointment date/time:  07/28/23 1:45 pm  Jullie Oiler L. Mcarthur Speedy, RN, BSN, CCM [place]  [facility], [facility] Health RN Care Manager Direct Dial: [phone]  Fax: [phone]

## 2023-07-21 ENCOUNTER — Telehealth: Payer: Self-pay | Admitting: *Deleted

## 2023-07-21 NOTE — Patient Outreach (Signed)
 Collaboration with pcp & Urology related to appetite stimulant  After 07/14/23 outreach to patient, reached out to pcp for assist with appetite stimulant per patient permission. RN CM response from pcp NP and advised to outreach to specialist, urology Urology sent in basket message and was advise reach out to pcp. Updated urology that this had been completed. Pending update from urology   Jullie Oiler L. Mcarthur Speedy, RN, BSN, CCM [place]  [facility], [facility] Health RN Care Manager Direct Dial: [phone]  Fax: [phone]

## 2023-07-28 ENCOUNTER — Other Ambulatory Visit: Payer: Self-pay

## 2023-07-28 ENCOUNTER — Other Ambulatory Visit: Payer: Self-pay | Admitting: *Deleted

## 2023-07-28 NOTE — Patient Outreach (Signed)
 Complex Care Management   Visit Note  07/28/2023  Name:  Karen Church MRN: [id number] DOB: 07-14-1950  Situation: Referral received for Complex Care Management related to COPD and malignant neoplasm of right ureter I obtained verbal consent from Patient.  Visit completed with Karen Church  on the phone  Preventive care  Patient hasn't seen a dentist for years but needs to be seen especially for 2 discolored front teeth  Vision exam is pending patient making an appointment Has not seen a podiatrist Reports stiff right hip and has ureter cancer (followed by Dr Sherrilee)   Background:   Past Medical History:  Diagnosis Date   Arthritis    Carpal tunnel syndrome    COPD, severe (HCC)    pulmolonogy--- dr Karen;  GOLD III; pt still smokes;  no oxygen  at home;    last exacerbation w/ hypoxia @ [place] urgent care 11-01-2022 O2 83-87% refused to go to ED, given steroid/ anticiotic/ nebs/ f/u 1-2 w/ pulm12-13-2021 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   Dyspnea    Hydronephrosis of right kidney    Melanoma (HCC)    R arm   Pneumonia    PONV (postoperative nausea and vomiting)     Assessment: Patient Reported Symptoms:  Cognitive Cognitive Status: Alert and oriented to person, place, and time, Insightful and able to interpret abstract concepts, Normal speech and language skills      Neurological Neurological Review of Symptoms: Weakness Neurological Management Strategies: Adequate rest, Routine screening Neurological Self-Management Outcome: 3 (uncertain)  HEENT HEENT Symptoms Reported: No symptoms reported HEENT Management Strategies: Adequate rest, Routine screening    Cardiovascular Cardiovascular Symptoms Reported: No symptoms reported Other Cardiovascular Symptoms: no cramps today Does patient have uncontrolled Hypertension?: No Cardiovascular Management Strategies: Adequate rest, Routine screening Weight: 99 lb (44.9 kg) Cardiovascular Self-Management Outcome: 4 (good)   Respiratory Respiratory Symptoms Reported: Productive cough Other Respiratory Symptoms: clear sputum especially after she eats Respiratory Conditions: COPD Respiratory Self-Management Outcome: 4 (good)  Endocrine Patient reports the following symptoms related to hypoglycemia or hyperglycemia : Weakness or fatigue Is patient diabetic?: No Endocrine Conditions: Other Other Endocrine Conditions: prediabetes Endocrine Management Strategies: Adequate rest, Coping strategies, Routine screening Endocrine Self-Management Outcome: 4 (good)  Gastrointestinal Gastrointestinal Symptoms Reported: Change in appetite Additional Gastrointestinal Details: Reports her appetite has slightly improved. She went out to eat 07/28/23 taking in boost Gastrointestinal Management Strategies: Diet modification Gastrointestinal Self-Management Outcome: 3 (uncertain) Nutrition Risk Screen (CP): Reduced oral intake over the last month  Genitourinary Genitourinary Symptoms Reported: Urgency Additional Genitourinary Details: post surgery has urgency, flows fast Genitourinary Conditions: Bloody urine, Burning/pain, Urinary tract infection, Kidney cancer Genitourinary Management Strategies: Adequate rest, Coping strategies Genitourinary Self-Management Outcome: 4 (good)  Integumentary Integumentary Symptoms Reported: No symptoms reported Skin Self-Management Outcome: 4 (good)  Musculoskeletal Musculoskelatal Symptoms Reviewed: No symptoms reported Musculoskeletal Self-Management Outcome: 3 (uncertain)   Patient at Risk for Falls Due to: History of fall(s), Impaired mobility  Psychosocial Psychosocial Symptoms Reported: No symptoms reported Behavioral Health Self-Management Outcome: 3 (uncertain) Major Change/Loss/Stressor/Fears (CP): Medical condition, self Techniques to Cope with Loss/Stress/Change: Diversional activities        07/28/2023    3:59 PM  Depression screen PHQ 2/9  Decreased Interest 0  Down,  Depressed, Hopeless 1  PHQ - 2 Score 1    There were no vitals filed for this visit.  Medications Reviewed Today     Reviewed by Ramonita Suzen CROME, RN (Registered Nurse) on 07/28/23 at [phone]  Med List Status: <None>   Medication Order Taking? Sig Documenting Provider Last Dose Status Informant  albuterol  (PROVENTIL ) (2.5 MG/3ML) 0.083% nebulizer solution [id number] No Take 2.5 mg by nebulization every 6 (six) hours as needed for wheezing or shortness of breath. [provider] Taking Active Self  albuterol  (VENTOLIN  HFA) 108 (90 Base) MCG/ACT inhaler [id number] No Inhale 2 puffs into the lungs every 4 (four) hours as needed for wheezing or shortness of breath. Karen Ozell NOVAK, MD Taking Active Self  aspirin  EC 81 MG tablet [id number] No Take 81 mg by mouth daily. [provider] Taking Active Self  Budeson-Glycopyrrol-Formoterol  (BREZTRI  AEROSPHERE) 160-9-4.8 MCG/ACT AERO [id number] No Inhale 2 puffs into the lungs 2 (two) times daily. Take 2 puffs first thing in am and then another 2 puffs about 12 hours later.  Patient taking differently: Inhale 2 puffs into the lungs 2 (two) times daily as needed (shortness of breath). SABRA Karen Ozell NOVAK, MD Taking Active Self  cholecalciferol (VITAMIN D3) 25 MCG (1000 UNIT) tablet [id number] No Take 1,000 Units by mouth daily. [provider] Taking Active Self  Multiple Vitamin (MULTIVITAMIN WITH MINERALS) TABS [id number] No Take 1 tablet by mouth daily. [provider] Taking Active Self  nitroGLYCERIN  (NITROSTAT ) 0.4 MG SL tablet [id number] No Place 1 tablet (0.4 mg total) under the tongue every 5 (five) minutes x 3 doses as needed for chest pain (If no relief after 3rd dose proceed to ED or call 911). Karen Church, Diannah SQUIBB, MD Taking Active Self  oxyCODONE  (OXY IR/ROXICODONE ) 5 MG immediate release tablet [id number] No Take 1 tablet (5 mg total) by mouth every 4 (four) hours as needed for moderate pain (pain score 4-6).  Patient  not taking: Reported on 06/03/2023   Sherrilee Belvie CROME, MD Not Taking Active   rosuvastatin  (CRESTOR ) 10 MG tablet [id number] No Take 1 tablet (10 mg total) by mouth daily. Karen Church, Diannah SQUIBB, MD Taking Active Self  Spacer/Aero-Holding Raguel (AEROCHAMBER PLS FLOVU MTHPIECE) DEVI [id number] No See admin instructions. [provider] Taking Active             Recommendation:   PCP Follow-up Specialty provider follow-up Dr Sherrilee Urology 09/14/23 Continue Current Plan of Care  Follow Up Plan:   Telephone follow up appointment date/time:  08/11/23 2 pm   Serenitee Fuertes L. Ramonita, RN, BSN, CCM [place]  [facility], [facility] Health RN Care Manager Direct Dial: [phone]  Fax: [phone]

## 2023-07-28 NOTE — Patient Instructions (Addendum)
 Visit Information  Thank you for taking time to visit with me today. Please don't hesitate to contact me if I can be of assistance to you before our next scheduled appointment.  Your next care management appointment is by telephone on 08/11/23 at 2 pm  Keep taking in your protein food  Please call the care guide team at [phone] if you need to cancel, schedule, or reschedule an appointment.   Please call the Suicide and Crisis Lifeline: 988 call the [place]  National Suicide Prevention Lifeline: 670 245 2252 or TTY: (620) 693-9883 TTY (518)756-8052) to talk to a trained counselor call 1-800-273-TALK (toll free, 24 hour hotline) call the [facility]: (754)485-0329 call 911 if you are experiencing a Mental Health or Behavioral Health Crisis or need someone to talk to.  Female Minish L. Mcarthur Speedy, RN, BSN, CCM [place]  [facility], [facility] RN Care Manager Direct Dial: [phone]  Fax: [phone]

## 2023-08-09 DIAGNOSIS — J449 Chronic obstructive pulmonary disease, unspecified: Secondary | ICD-10-CM | POA: Diagnosis not present

## 2023-08-09 DIAGNOSIS — Z23 Encounter for immunization: Secondary | ICD-10-CM | POA: Diagnosis not present

## 2023-08-09 DIAGNOSIS — R300 Dysuria: Secondary | ICD-10-CM | POA: Diagnosis not present

## 2023-08-09 DIAGNOSIS — I10 Essential (primary) hypertension: Secondary | ICD-10-CM | POA: Diagnosis not present

## 2023-08-09 DIAGNOSIS — C661 Malignant neoplasm of right ureter: Secondary | ICD-10-CM | POA: Diagnosis not present

## 2023-08-09 DIAGNOSIS — R0989 Other specified symptoms and signs involving the circulatory and respiratory systems: Secondary | ICD-10-CM | POA: Diagnosis not present

## 2023-08-09 DIAGNOSIS — M25551 Pain in right hip: Secondary | ICD-10-CM | POA: Diagnosis not present

## 2023-08-09 DIAGNOSIS — R079 Chest pain, unspecified: Secondary | ICD-10-CM | POA: Diagnosis not present

## 2023-08-09 DIAGNOSIS — F17218 Nicotine dependence, cigarettes, with other nicotine-induced disorders: Secondary | ICD-10-CM | POA: Diagnosis not present

## 2023-08-09 DIAGNOSIS — E782 Mixed hyperlipidemia: Secondary | ICD-10-CM | POA: Diagnosis not present

## 2023-08-09 DIAGNOSIS — R7303 Prediabetes: Secondary | ICD-10-CM | POA: Diagnosis not present

## 2023-08-09 DIAGNOSIS — R944 Abnormal results of kidney function studies: Secondary | ICD-10-CM | POA: Diagnosis not present

## 2023-08-11 ENCOUNTER — Encounter: Payer: Self-pay | Admitting: *Deleted

## 2023-08-11 ENCOUNTER — Telehealth: Payer: Self-pay | Admitting: *Deleted

## 2023-09-15 ENCOUNTER — Other Ambulatory Visit: Admitting: Urology

## 2023-09-29 ENCOUNTER — Ambulatory Visit: Admitting: Urology

## 2023-09-29 VITALS — BP 161/75 | HR 109

## 2023-09-29 DIAGNOSIS — C661 Malignant neoplasm of right ureter: Secondary | ICD-10-CM | POA: Diagnosis not present

## 2023-09-29 LAB — URINALYSIS, ROUTINE W REFLEX MICROSCOPIC
Bilirubin, UA: NEGATIVE
Glucose, UA: NEGATIVE
Ketones, UA: NEGATIVE
Leukocytes,UA: NEGATIVE
Nitrite, UA: NEGATIVE
Protein,UA: NEGATIVE
RBC, UA: NEGATIVE
Specific Gravity, UA: <1.005 — ABNORMAL LOW (ref 1.005–1.030)
Urobilinogen, Ur: 0.2 mg/dL (ref 0.2–1.0)
pH, UA: 6 (ref 5.0–7.5)

## 2023-09-29 MED ORDER — CIPROFLOXACIN HCL 500 MG PO TABS
500.0000 mg | ORAL_TABLET | Freq: Once | ORAL | Status: AC
  Administered 2023-09-29: 500 mg via ORAL

## 2023-09-29 NOTE — Progress Notes (Signed)
   09/29/23  CC: folowup bladder cancer  HPI: Karen Church is a 73yo here for followup for bladder cancer  Blood pressure (!) 161/75, pulse (!) 109. NED. A&Ox3.   No respiratory distress   Abd soft, NT, ND Normal external genitalia with patent urethral meatus  Cystoscopy Procedure Note  Patient identification was confirmed, informed consent was obtained, and patient was prepped using Betadine solution.  Lidocaine  jelly was administered per urethral meatus.    Procedure: - Flexible cystoscope introduced, without any difficulty.   - Thorough search of the bladder revealed:    normal urethral meatus    normal urothelium    no stones    no ulcers     no tumors    no urethral polyps    no trabeculation  - Ureteral orifices were normal in position and appearance.  Post-Procedure: - Patient tolerated the procedure well  Assessment/ Plan: Followup 3 months for cystoscopy   No follow-ups on file.  Belvie Clara, MD

## 2023-10-05 ENCOUNTER — Encounter: Payer: Self-pay | Admitting: Urology

## 2023-10-05 NOTE — Patient Instructions (Signed)

## 2023-10-29 ENCOUNTER — Telehealth: Payer: Self-pay | Admitting: *Deleted

## 2023-10-29 NOTE — Progress Notes (Signed)
 Complex Care Management Care Guide Note  10/29/2023 Name: Karen Church MRN: [id number] DOB: [birth year]  Karen Church is a 73 y.o. year old female who is a primary care patient of Shona, Norleen PEDLAR, MD and is actively engaged with the care management team. I reached out to Ronal JINNY Glatter by phone today to assist with re-scheduling  with the RN Case Manager.  Follow up plan: Unsuccessful telephone outreach attempt made. A HIPAA compliant phone message was left for the patient providing contact information and requesting a return call.  Thedford Franks, CMA [place]  [facility], [facility] Guide Direct Dial: [phone]  Fax: [phone] Website: [email or web address]

## 2023-11-01 NOTE — Progress Notes (Signed)
 Complex Care Management Care Guide Note  11/01/2023 Name: Karen Church MRN: [id number] DOB: 02-Mar-1950  Karen Church is a 73 y.o. year old female who is a primary care patient of Shona, Norleen PEDLAR, MD and is actively engaged with the care management team. I reached out to Ronal JINNY Glatter by phone today to assist with re-scheduling  with the RN Case Manager.  Follow up plan: Unsuccessful telephone outreach attempt made. A HIPAA compliant phone message was left for the patient providing contact information and requesting a return call.  Thedford Franks, CMA [place]  [facility], [facility] Guide Direct Dial: [phone]  Fax: [phone] Website: [email or web address]

## 2023-11-03 ENCOUNTER — Encounter: Payer: Self-pay | Admitting: *Deleted

## 2023-11-03 NOTE — Patient Instructions (Addendum)
 Ronal JINNY Glatter - I or my staff have attempted to call you three times but have been unsuccessful in reaching you. I work with Shona, Norleen PEDLAR, MD and am calling to support your healthcare needs. If we can be of assistance to you, please contact the case management program at [phone] or request your MD send another referral.     Thank you,  Suzen L. Ramonita, RN, BSN, CCM [place]  [facility], [facility] RN Care Manager

## 2023-11-17 DIAGNOSIS — R079 Chest pain, unspecified: Secondary | ICD-10-CM | POA: Diagnosis not present

## 2023-11-17 DIAGNOSIS — R944 Abnormal results of kidney function studies: Secondary | ICD-10-CM | POA: Diagnosis not present

## 2023-11-17 DIAGNOSIS — R0989 Other specified symptoms and signs involving the circulatory and respiratory systems: Secondary | ICD-10-CM | POA: Diagnosis not present

## 2023-11-17 DIAGNOSIS — F17218 Nicotine dependence, cigarettes, with other nicotine-induced disorders: Secondary | ICD-10-CM | POA: Diagnosis not present

## 2023-11-17 DIAGNOSIS — F17210 Nicotine dependence, cigarettes, uncomplicated: Secondary | ICD-10-CM | POA: Diagnosis not present

## 2023-11-17 DIAGNOSIS — E782 Mixed hyperlipidemia: Secondary | ICD-10-CM | POA: Diagnosis not present

## 2023-11-17 DIAGNOSIS — N898 Other specified noninflammatory disorders of vagina: Secondary | ICD-10-CM | POA: Diagnosis not present

## 2023-11-17 DIAGNOSIS — R7303 Prediabetes: Secondary | ICD-10-CM | POA: Diagnosis not present

## 2023-11-17 DIAGNOSIS — J449 Chronic obstructive pulmonary disease, unspecified: Secondary | ICD-10-CM | POA: Diagnosis not present

## 2023-11-17 DIAGNOSIS — I10 Essential (primary) hypertension: Secondary | ICD-10-CM | POA: Diagnosis not present

## 2023-11-17 DIAGNOSIS — C661 Malignant neoplasm of right ureter: Secondary | ICD-10-CM | POA: Diagnosis not present

## 2023-11-17 DIAGNOSIS — Z23 Encounter for immunization: Secondary | ICD-10-CM | POA: Diagnosis not present

## 2023-12-13 ENCOUNTER — Ambulatory Visit: Admitting: Internal Medicine

## 2023-12-15 ENCOUNTER — Ambulatory Visit: Admitting: Internal Medicine

## 2023-12-15 ENCOUNTER — Encounter: Payer: Self-pay | Admitting: Internal Medicine

## 2023-12-15 VITALS — BP 138/78 | HR 88 | Ht 61.0 in | Wt 104.8 lb

## 2023-12-15 DIAGNOSIS — F17210 Nicotine dependence, cigarettes, uncomplicated: Secondary | ICD-10-CM

## 2023-12-15 DIAGNOSIS — J449 Chronic obstructive pulmonary disease, unspecified: Secondary | ICD-10-CM | POA: Diagnosis not present

## 2023-12-15 NOTE — Progress Notes (Unsigned)
 Karen Church, female    DOB: March 12, 1950    MRN: [id number]   Brief patient profile:  83 yowf active smoker/MM  with pattern of acute flares of cough with purulent sptum and pleuritic pain usually same area most noted in back, sometimes in front with most recent cxr's form 11/21/19 showing persistent RML atx/ nodular changes R base so referred to pulmonary clinic in [place]  12/26/2019 by Karen Church and proved to have GOLD 3 copd 09/2022      History of Present Illness  12/26/2019  Pulmonary/ 1st office eval/ Karen Church / [place] Office  Chief Complaint  Patient presents with   Pulmonary Consult    Referred by Karen Heller, Karen Church for eval of abnormal cxr. Pt states has had PNA multiple times since 2008.  She had PNA last in Oct 2021- has had some weakness since then but overall her breathing is doing well. She has prod cough with clear sputum.  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 Continue trelegy daily  Nebulizer (machine) can be used up to every 4 hours if needed        04/24/2022  f/u ov/Karen Church office/Karen Church re: copd  maint on prn saba   Chief Complaint  Patient presents with   Follow-up    Pt f/u for COPD states that she is doing well and breathing is baseline  Dyspnea:  Not limited by breathing from desired activities  / still doing landscaping  Cough: am congestion white / also worse breathing x first hour each am    Sleeping: 45 degrees recliner  SABA use:  saba few times a month  /  has neb very rarely  02: none   Rec.  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  Work on inhaler technique:  >>>  Remember how golfers warm up by taking practice swings - do this with an empty inhaler  Plan B = Backup (to supplement plan A, not to replace it) Only use your albuterol  inhaler as a rescue medication Plan C =  Crisis (instead of Plan B but only if Plan B stops working) - only use your albuterol  nebulizer if you first try Plan B The key is to stop smoking completely before smoking completely stops you!  Please schedule a follow up office visit in 6 weeks, call sooner if needed with all medications /inhalers/ solutions in hand so we can verify exactly what you are taking. This includes all medications from all doctors and over the counters     01/01/2023  f/u ov/Karen Church office/Karen Church re: GOLD 3 copd  maint on no resp rx  / still smoking  Only brought breztri  sample / w/u for microscopic hematuria needs scope and possible stent  Chief Complaint  Patient presents with   Follow-up    Needs risk assessment for kidney procedure. She is c/o increased chest tightness, prod cough with clear sputum, and SOB x 2 months. Still smoking.    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  Work on inhaler technique: Plan B = Backup (to supplement plan A, not to replace it) Only use your albuterol  inhaler as a rescue medication Plan C = Crisis (instead of Plan B but only if Plan B stops working) - only use your albuterol  nebulizer if you first try Plan B  Do plan C right before you leave home for your procedure  Please schedule a follow up visit in 3 months but call sooner if needed - bring inhalers    04/05/2023  f/u ov/Karen Church office/Karen Church re: GOLD 3 maint on breztri  prn/ still smoking   Chief Complaint  Patient presents with   Follow-up    Copd gold   Dyspnea:  winter mode =  very sedentrary - not limited by doe so not taking Breztri  much at all  Cough: clear mucus  Sleeping: 30 degree recliner x years   s  resp cc  SABA use: rare hfa/ never neb  02: none  LCS: needs 10/2023 LDSCT  Rec The key is to stop smoking completely before smoking completely stops you! Work on inhaler technique >>>  Remember how golfers warm up by taking practice swings - do this with an empty inhaler     12/15/2023  f/u ov/Karen Church office/Karen Church re: COPD GOLD 3  maint on Breztri  thru spacer   Chief Complaint  Patient presents with   COPD    Coughing w/ clear mucus in mornings  Shob   Dyspnea:  shopping [employer] store  says same  speed as others  Cough: p coffee all clears  up / mucoid x 15 min total  Sleeping: 30 degrees recliner x years s noct    resp cc  SABA use: none  02: none    Lung cancer screening: referred today    No obvious day to day or daytime variability or assoc  purulent sputum or mucus plugs or hemoptysis or cp  or chest tightness, subjective wheeze or overt sinus or hb symptoms.    Also denies any obvious fluctuation of symptoms with weather or environmental changes or other aggravating or alleviating factors except as outlined above   No unusual exposure hx or h/o childhood pna/ asthma or knowledge of premature birth.  Current Allergies, Complete Past Medical History, Past Surgical History, Family History, and Social History were reviewed in [employer] record.  ROS  The following are not active complaints unless bolded Hoarseness, sore throat, dysphagia, dental problems, itching, sneezing,  nasal congestion or discharge of excess mucus or purulent secretions, ear ache,   fever, chills, sweats, unintended wt loss or wt gain, classically pleuritic or exertional cp,  orthopnea pnd or arm/hand swelling  or leg swelling, presyncope, palpitations, abdominal pain, anorexia, nausea, vomiting, diarrhea  or change in bowel habits or change in bladder habits, change in stools or change in urine, dysuria, hematuria,  rash, arthralgias, visual complaints, headache, numbness, weakness or ataxia or problems with walking or coordination,  change in mood or  memory.        Current Meds  Medication Sig   albuterol  (PROVENTIL ) (2.5 MG/3ML) 0.083% nebulizer solution Take 2.5 mg by nebulization every 6 (six) hours as needed for wheezing or shortness of breath.   albuterol  (VENTOLIN  HFA) 108 (90 Base) MCG/ACT inhaler Inhale 2 puffs into the lungs every 4 (four) hours as needed for wheezing or shortness of breath.   aspirin  EC 81 MG tablet Take 81 mg by mouth daily.   Budeson-Glycopyrrol-Formoterol  (BREZTRI  AEROSPHERE) 160-9-4.8 MCG/ACT AERO Inhale 2 puffs into the lungs 2 (two) times daily. Take 2 puffs first thing in am and then another 2 puffs about  12 hours later. (Patient taking differently: Inhale 2 puffs into the lungs 2 (two) times daily as needed (shortness of breath). SABRA)   cholecalciferol (VITAMIN D3) 25 MCG (1000 UNIT) tablet Take 1,000 Units by mouth daily.   Multiple Vitamin (MULTIVITAMIN WITH MINERALS) TABS Take 1 tablet by mouth daily.   nitroGLYCERIN  (NITROSTAT ) 0.4 MG SL tablet Place 1 tablet (0.4 mg total) under the tongue every 5 (five) minutes x 3 doses as needed for chest pain (If no relief after 3rd dose proceed to ED or call 911).   oxyCODONE  (OXY IR/ROXICODONE ) 5 MG immediate release tablet Take 1 tablet (5 mg total) by mouth every 4 (four) hours as needed for moderate pain (pain score 4-6).   rosuvastatin  (CRESTOR ) 10 MG tablet Take 1 tablet (10 mg total) by mouth daily.   Spacer/Aero-Holding Chambers (AEROCHAMBER PLS FLOVU MTHPIECE) DEVI See admin instructions.           Past Medical History:  Diagnosis Date   Carpal tunnel syndrome    COPD (chronic obstructive pulmonary disease) (HCC)    Objective:    wts  12/15/2023        104   04/05/2023        104  01/01/2023      106  11/03/2022       105 04/24/2022       106  12/29/2021     102   02/21/20 113 lb 12.8 oz (51.6 kg)  12/26/19 113 lb (51.3 kg)  09/14/18 120 lb (54.4 kg)     Vital signs reviewed  12/15/2023  - Note at rest 02 sats  92% on RA   General appearance:    amb wf congested sounding cough   HEENT :  Oropharynx  clear  Nasal turbinates nl   NECK :  without JVD/Nodes/TM/ nl carotid upstrokes bilaterally   LUNGS: no acc muscle use,  Mod barrel  contour chest wall with bilateral  Distant bs s audible wheeze and  without cough on insp or exp maneuvers and mod  Hyperresonant  to  percussion bilaterally     CV:  RRR  no s3 or murmur or increase in P2, and no edema   ABD:  abd soft and nontender    MS:   Ext warm without deformities or   obvious joint restrictions , calf tenderness, cyanosis or clubbing  SKIN: warm and dry without lesions     NEURO:  alert, approp, nl sensorium with  no motor or cerebellar deficits apparent.                     Assessment

## 2023-12-15 NOTE — Assessment & Plan Note (Signed)
 5  min discussion re active cigarette smoking in addition to office E&M  Ask about tobacco use:   ongoing Advise quitting   I took an extended  opportunity with this patient to outline the consequences of continued cigarette use  in airway disorders based on all the data we have from the multiple national lung health studies (perfomed over decades at millions of dollars in cost)  indicating that smoking cessation, not choice of inhalers or pulmonary physicians, is the most important aspect of her care.   Assess willingness:  Not committed at this point Assist in quit attempt:  Per PCP when ready Arrange follow up:   Follow up per Primary Care planned

## 2023-12-15 NOTE — Patient Instructions (Addendum)
 My office will be contacting you by phone for referral to lung cancer screening   [phone]) - if you don't hear back from my office within one week,  please call us  back or notify us  thru MyChart and we'll address it right away.    The key is to stop smoking completely before smoking completely stops you!  Please schedule a follow up visit in 12  months but call sooner if needed

## 2023-12-16 NOTE — Assessment & Plan Note (Addendum)
 Active smoker/MM  - 12/26/2019  Rec mucinex  / flutter and continue trelegy  - alpha one phenotype 02/21/20  MM level 213  - d/c'd trelegy ? Summer of 2023 and no worse at [facility] 12/29/2021 so left off and just rec saba prn  - PFT's  09/29/22  FEV1 0.92 (47 % ) ratio 0.54  p 12 % improvement from saba p 0 prior to study with DLCO  9.53 (55%)   and FV curve mildly concave   - 11/03/2022  After extensive coaching inhaler device,  effectiveness =    60% (short ti) > trial of breztri  2bid and f/u 6 weeks with option to change to symb 80/spiriva in case of MAI  - 04/05/2023  After extensive coaching inhaler device,  effectiveness =  60%  (short ti)  - 12/15/2023  After extensive coaching inhaler device,  effectiveness =   < 25% with very delayed insp  - really can't use hfa s spacer    Group E in terms of symptoms/risk so  laba/lama/ICS  therefore appropriate rx at this point >>>  breztri    and approp SABA prn.

## 2024-01-03 ENCOUNTER — Other Ambulatory Visit (HOSPITAL_COMMUNITY): Payer: Self-pay | Admitting: Nurse Practitioner

## 2024-01-03 DIAGNOSIS — Z122 Encounter for screening for malignant neoplasm of respiratory organs: Secondary | ICD-10-CM

## 2024-01-03 DIAGNOSIS — F17200 Nicotine dependence, unspecified, uncomplicated: Secondary | ICD-10-CM

## 2024-01-05 ENCOUNTER — Encounter: Payer: Self-pay | Admitting: Urology

## 2024-01-05 ENCOUNTER — Ambulatory Visit: Admitting: Urology

## 2024-01-05 VITALS — BP 108/63 | HR 75

## 2024-01-05 DIAGNOSIS — C661 Malignant neoplasm of right ureter: Secondary | ICD-10-CM | POA: Diagnosis not present

## 2024-01-05 DIAGNOSIS — D494 Neoplasm of unspecified behavior of bladder: Secondary | ICD-10-CM

## 2024-01-05 LAB — MICROSCOPIC EXAMINATION: Bacteria, UA: NONE SEEN

## 2024-01-05 LAB — URINALYSIS, ROUTINE W REFLEX MICROSCOPIC
Bilirubin, UA: NEGATIVE
Glucose, UA: NEGATIVE
Ketones, UA: NEGATIVE
Nitrite, UA: NEGATIVE
Protein,UA: NEGATIVE
Specific Gravity, UA: <1.005 — ABNORMAL LOW (ref 1.005–1.030)
Urobilinogen, Ur: 0.2 mg/dL (ref 0.2–1.0)
pH, UA: 6 (ref 5.0–7.5)

## 2024-01-05 MED ORDER — CIPROFLOXACIN HCL 500 MG PO TABS
500.0000 mg | ORAL_TABLET | Freq: Once | ORAL | Status: AC
  Administered 2024-01-05: 500 mg via ORAL

## 2024-01-05 NOTE — Patient Instructions (Signed)
 Transurethral Resection of Bladder Tumor  Transurethral resection of a bladder tumor is the removal (resection) of cancerous tissue (tumor) from the inside wall of the bladder. The bladder is the organ that holds urine. The tumor is removed through the tube that carries urine out of the body (urethra). In a transurethral resection, a thin telescope with a light, a tiny camera, and an electric cutting edge (resectoscope) is passed through the urethra. In men, the opening of the urethra is at the end of the penis. In women, it is just above the opening of the vagina. Tell a health care provider about: Any allergies you have. All medicines you are taking, including vitamins, herbs, eye drops, creams, and over-the-counter medicines. Any problems you or family members have had with anesthetic medicines. Any bleeding problems you have. Any surgeries you have had. Any medical conditions you have, including recent urinary tract infections. Whether you are pregnant or may be pregnant. What are the risks? Generally, this is a safe procedure. However, problems may occur, including: Infection. Bleeding. Allergic reactions to medicines. Damage to nearby structures or organs. Difficulty urinating from blockage of the urethra or not being able to urinate (urinary retention). Deep vein thrombosis. This is a blood clot that can develop in your leg. Recurring cancer. What happens before the procedure? When to stop eating and drinking Follow instructions from your health care provider about what you may eat and drink before your procedure. These may include: 8 hours before your procedure Stop eating most foods. Do not eat meat, fried foods, or fatty foods. Eat only light foods, such as toast or crackers. All liquids are okay except energy drinks and alcohol. 6 hours before your procedure Stop eating. Drink only clear liquids, such as water, clear fruit juice, black coffee, plain tea, and sports  drinks. Do not drink energy drinks or alcohol. 2 hours before your procedure Stop drinking all liquids. You may be allowed to take medicines with small sips of water. Medicines Ask your health care provider about: Changing or stopping your regular medicines. This is especially important if you are taking diabetes medicines or blood thinners. Taking medicines such as aspirin and ibuprofen. These medicines can thin your blood. Do not take these medicines unless your health care provider tells you to take them. Taking over-the-counter medicines, vitamins, herbs, and supplements. General instructions If you will be going home right after the procedure, plan to have a responsible adult: Take you home from the hospital or clinic. You will not be allowed to drive. Care for you for the time you are told. Ask your health care provider what steps will be taken to help prevent infection. These steps may include: Washing skin with a germ-killing soap. Taking antibiotic medicine. Do not use any products that contain nicotine or tobacco for at least 4 weeks before the procedure. These products include cigarettes, chewing tobacco, and vaping devices, such as e-cigarettes. If you need help quitting, ask your health care provider. What happens during the procedure? An IV will be inserted into one of your veins. You will be given one or more of the following: A medicine to help you relax (sedative). A medicine that is injected into your spine to numb the area below and slightly above the injection site (spinal anesthetic). A medicine that is injected into an area of your body to numb everything below the injection site (regional anesthetic). A medicine to make you fall asleep (general anesthetic). Your legs will be placed in foot rests (  stirrups) to open your legs and bend your knees. The resectoscope will be passed through your urethra and into your bladder. The part of your bladder with the tumor will be  resected by the cutting edge of the resectoscope. Fluid will be passed to rinse out the cut tissues (irrigation). The resectoscope will then be taken out. A small, thin tube (catheter) will be passed through your urethra and into your bladder. The catheter will drain urine into a bag outside of your body. The procedure may vary among health care providers and hospitals. What happens after the procedure? Your blood pressure, heart rate, breathing rate, and blood oxygen level will be monitored until you leave the hospital or clinic. You may continue to receive fluids and medicines through an IV. You will be given pain medicine to relieve pain. You will have a catheter to drain your urine. The amount of urine will be measured. If you have blood in your urine, your bladder may be rinsed out by passing fluid through your catheter. You will be encouraged to walk as soon as you can. You may have to wear compression stockings. These stockings help to prevent blood clots and reduce swelling in your legs. If you were given a sedative during the procedure, it can affect you for several hours. Do not drive or operate machinery until your health care provider says that it is safe. Summary Transurethral resection of a bladder tumor is the removal (resection) of a cancerous growth (tumor) on the inside wall of the bladder. To do this procedure, your health care provider uses a thin telescope with a light, a tiny camera, and an electric cutting edge (resectoscope) that is guided to your bladder through your urethra. The part of your bladder that is affected by the tumor will be resected by the cutting edge of the resectoscope. A catheter will be passed through your urethra and into your bladder. The catheter will drain urine into a bag outside of your body. If you will be going home right after the procedure, plan to have a responsible adult take you home from the hospital or clinic. You will not be allowed to  drive. This information is not intended to replace advice given to you by your health care provider. Make sure you discuss any questions you have with your health care provider. Document Revised: 01/31/2021 Document Reviewed: 01/31/2021 Elsevier Patient Education  2024 [employer].

## 2024-01-05 NOTE — Progress Notes (Signed)
   01/05/24  CC: followup ureteral cancer   HPI: [name] is a 73yo here for followup for ureteral cancer Blood pressure 108/63, pulse 75. NED. A&Ox3.   No respiratory distress   Abd soft, NT, ND Normal external genitalia with patent urethral meatus  Cystoscopy Procedure Note  Patient identification was confirmed, informed consent was obtained, and patient was prepped using Betadine solution.  Lidocaine  jelly was administered per urethral meatus.    Procedure: - Flexible cystoscope introduced, without any difficulty.   - Thorough search of the bladder revealed:    normal urethral meatus    normal urothelium    no stones    no ulcers     Multiple 3-54mm posterior papillary tumors    no urethral polyps    no trabeculation  - Ureteral orifices were normal in position and appearance.  Post-Procedure: - Patient tolerated the procedure well  Assessment/ Plan: The risks/benefits/alternatives to transurethral resection of a bladder tumor was explained to the patient and she understands and wishes to proceed with surgery   No follow-ups on file.  Belvie Clara, MD

## 2024-01-08 ENCOUNTER — Other Ambulatory Visit: Payer: Self-pay | Admitting: Internal Medicine

## 2024-01-13 ENCOUNTER — Encounter: Payer: Self-pay | Admitting: *Deleted

## 2024-01-13 NOTE — Progress Notes (Signed)
 Karen Church                                          MRN: [id number]   01/13/2024   The [facility] Quality Team Specialist reviewed this patient medical record for the purposes of chart review for care gap closure. The following were reviewed: chart review for care gap closure-breast cancer screening.    [facility] Quality Team

## 2024-01-22 ENCOUNTER — Ambulatory Visit (HOSPITAL_COMMUNITY)
Admission: RE | Admit: 2024-01-22 | Discharge: 2024-01-22 | Disposition: A | Source: Ambulatory Visit | Attending: Nurse Practitioner | Admitting: Nurse Practitioner

## 2024-01-22 DIAGNOSIS — I700 Atherosclerosis of aorta: Secondary | ICD-10-CM | POA: Diagnosis not present

## 2024-01-22 DIAGNOSIS — J439 Emphysema, unspecified: Secondary | ICD-10-CM | POA: Diagnosis not present

## 2024-01-22 DIAGNOSIS — F17210 Nicotine dependence, cigarettes, uncomplicated: Secondary | ICD-10-CM | POA: Diagnosis not present

## 2024-01-22 DIAGNOSIS — Z122 Encounter for screening for malignant neoplasm of respiratory organs: Secondary | ICD-10-CM

## 2024-01-22 DIAGNOSIS — I2510 Atherosclerotic heart disease of native coronary artery without angina pectoris: Secondary | ICD-10-CM | POA: Diagnosis not present

## 2024-01-22 DIAGNOSIS — F17200 Nicotine dependence, unspecified, uncomplicated: Secondary | ICD-10-CM | POA: Diagnosis not present

## 2024-02-28 NOTE — Patient Instructions (Signed)
 "         Karen Church  02/28/2024     @PREFPERIOPPHARMACY @   Your procedure is scheduled on 03/02/2024.   Report to [facility] at  [phone] A.M.   Call this number if you have problems the morning of surgery:  [phone]  If you experience any cold or flu symptoms such as cough, fever, chills, shortness of breath, etc. between now and your scheduled surgery, please notify us  at the above number.   Remember:  Do not eat after midnight.    You may drink clear liquids until  0430 am on 03/02/2024.       Clear liquids allowed are:                    Water , Carbonated beverages (diabetics please choose diet or no sugar options), Black Coffee Only (No creamer, milk or cream, including half & half and powdered creamer), and Clear Sports drink (No red color; diabetics please choose diet or no sugar options)    Take these medicines the morning of surgery with A SIP OF WATER                                          oxycodone (if needed).    Do not wear jewelry, make-up or nail polish, including gel polish,  artificial nails, or any other type of covering on natural nails (fingers and  toes).  Do not wear lotions, powders, or perfumes, or deodorant.  Do not shave 48 hours prior to surgery.  Men may shave face and neck.  Do not bring valuables to the hospital.  [facility] is not responsible for any belongings or valuables.  Contacts, dentures or bridgework may not be worn into surgery.  Leave your suitcase in the car.  After surgery it may be brought to your room.  For patients admitted to the hospital, discharge time will be determined by your treatment team.  Patients discharged the day of surgery will not be allowed to drive home and must have someone with them for 24 hours.    Special instructions:   DO NOT smoke tobacco or vape for 24 hours before your procedure.  Please read over the following fact sheets that you were given. Anesthesia Post-op Instructions and Care and  Recovery After Surgery      Transurethral Resection of Bladder Tumor, Care After The following information offers guidance on how to care for yourself after your procedure. Your health care provider may also give you more specific instructions. If you have problems or questions, contact your health care provider. What can I expect after the procedure? After the procedure, it is common to have: A small amount of blood or small blood clots in your urine for up to 2 weeks. Soreness or mild pain from your catheter. After your catheter is removed, you may have mild soreness, especially when urinating. A need to urinate often. Pain in your lower abdomen. Follow these instructions at home: Medicines  Take over-the-counter and prescription medicines only as told by your health care provider. If you were prescribed an antibiotic medicine, take it as told by your health care provider. Do not stop taking the antibiotic even if you start to feel better. Ask your health care provider if the medicine prescribed to you: Requires you to avoid driving or using machinery. Can cause constipation. You may need  to take these actions to prevent or treat constipation: Drink enough fluid to keep your urine pale yellow. Take over-the-counter or prescription medicines. Eat foods that are high in fiber, such as beans, whole grains, and fresh fruits and vegetables. Limit foods that are high in fat and processed sugars, such as fried or sweet foods. Activity  If you were given a sedative during the procedure, it can affect you for several hours. Do not drive or operate machinery until your health care provider says that it is safe. Rest as told by your health care provider. Avoid sitting for a long time without moving. Get up to take short walks every 1-2 hours. This is important to improve blood flow and breathing. Ask for help if you feel weak or unsteady. Do not lift anything that is heavier than 10 lb (4.5 kg),  or the limit that you are told, until your health care provider says that it is safe. Avoid intense physical activity for as long as told by your health care provider. Do not have sex until your health care provider approves. Return to your normal activities as told by your health care provider. Ask your health care provider what activities are safe for you. General instructions If you have a catheter, follow instructions from your health care provider about caring for your catheter and your drainage bag. Do not drink alcohol for as long as told by your health care provider. This is especially important if you are taking prescription pain medicines. Do not use any products that contain nicotine or tobacco. These products include cigarettes, chewing tobacco, and vaping devices, such as e-cigarettes. If you need help quitting, ask your health care provider. Wear compression stockings as told by your health care provider. These stockings help to prevent blood clots and reduce swelling in your legs. Keep all follow-up visits. This is important. You will need to be followed closely with regular checks of your bladder and urethra (cystoscopies) to make sure that the cancer does not come back. Contact a health care provider if: You have blood in your urine for more than 2 weeks. You become constipated. Signs of constipation may include: Having fewer than three bowel movements in a week. Difficulty having a bowel movement. Stools that are dry, hard, or larger than normal. You have a urinary catheter in place, and you have: Spasms or pain. Problems with your catheter or your catheter is blocked. Your catheter has been taken out but you are unable to urinate. You have signs of infection, such as: Fever or chills. Cloudy or bad-smelling urine. Get help right away if: You have severe abdominal pain that gets worse or does not improve with medicine. You have a lot of large blood clots in your  urine. You develop swelling or pain in your leg. You have difficulty breathing. These symptoms may be an emergency. Get help right away. Call 911. Do not wait to see if the symptoms will go away. Do not drive yourself to the hospital. Summary After your procedure, it is common to have a small amount of blood or small blood clots in your urine, soreness or mild pain from your catheter, and pain in your lower abdomen. Take over-the-counter and prescription medicines only as told by your health care provider. Rest as told by your health care provider. Follow your health care provider's instructions about returning to normal activities. Ask what activities are safe for you. If you have a catheter, follow instructions from your health care provider  about caring for your catheter and your drainage bag. This information is not intended to replace advice given to you by your health care provider. Make sure you discuss any questions you have with your health care provider. Document Revised: 01/31/2021 Document Reviewed: 01/31/2021 Elsevier Patient Education  2024 Elsevier [employer].General Anesthesia, Adult, Care After The following information offers guidance on how to care for yourself after your procedure. Your health care provider may also give you more specific instructions. If you have problems or questions, contact your health care provider. What can I expect after the procedure? After the procedure, it is common for people to: Have pain or discomfort at the IV site. Have nausea or vomiting. Have a sore throat or hoarseness. Have trouble concentrating. Feel cold or chills. Feel weak, sleepy, or tired (fatigue). Have soreness and body aches. These can affect parts of the body that were not involved in surgery. Follow these instructions at home: For the time period you were told by your health care provider:  Rest. Do not participate in activities where you could fall or become injured. Do not  drive or use machinery. Do not drink alcohol. Do not take sleeping pills or medicines that cause drowsiness. Do not make important decisions or sign legal documents. Do not take care of children on your own. General instructions Drink enough fluid to keep your urine pale yellow. If you have sleep apnea, surgery and certain medicines can increase your risk for breathing problems. Follow instructions from your health care provider about wearing your sleep device: Anytime you are sleeping, including during daytime naps. While taking prescription pain medicines, sleeping medicines, or medicines that make you drowsy. Return to your normal activities as told by your health care provider. Ask your health care provider what activities are safe for you. Take over-the-counter and prescription medicines only as told by your health care provider. Do not use any products that contain nicotine or tobacco. These products include cigarettes, chewing tobacco, and vaping devices, such as e-cigarettes. These can delay incision healing after surgery. If you need help quitting, ask your health care provider. Contact a health care provider if: You have nausea or vomiting that does not get better with medicine. You vomit every time you eat or drink. You have pain that does not get better with medicine. You cannot urinate or have bloody urine. You develop a skin rash. You have a fever. Get help right away if: You have trouble breathing. You have chest pain. You vomit blood. These symptoms may be an emergency. Get help right away. Call 911. Do not wait to see if the symptoms will go away. Do not drive yourself to the hospital. Summary After the procedure, it is common to have a sore throat, hoarseness, nausea, vomiting, or to feel weak, sleepy, or fatigue. For the time period you were told by your health care provider, do not drive or use machinery. Get help right away if you have difficulty breathing, have  chest pain, or vomit blood. These symptoms may be an emergency. This information is not intended to replace advice given to you by your health care provider. Make sure you discuss any questions you have with your health care provider. Document Revised: 04/25/2021 Document Reviewed: 04/25/2021 Elsevier Patient Education  2024 Elsevier Inc.How to Use Chlorhexidine  at Home in the Shower Chlorhexidine  gluconate (CHG) is a germ-killing (antiseptic) wash that's used to clean the skin. It can get rid of the germs that normally live on the skin and can  keep them away for about 24 hours. If you're having surgery, you may be told to shower with CHG at home the night before surgery. This can help lower your risk for infection. To use CHG wash in the shower, follow the steps below. Supplies needed: CHG body wash. Clean washcloth. Clean towel. How to use CHG in the shower Follow these steps unless you're told to use CHG in a different way: Start the shower. Use your normal soap and shampoo to wash your face and hair. Turn off the shower or move out of the shower stream. Pour CHG onto a clean washcloth. Do not use any type of brush or rough sponge. Start at your neck, washing your body down to your toes. Make sure you: Wash the part of your body where the surgery will be done for at least 1 minute. Do not scrub. Do not use CHG on your head or face unless your health care provider tells you to. If it gets into your ears or eyes, rinse them well with water . Do not wash your genitals with CHG. Wash your back and under your arms. Make sure to wash skin folds. Let the CHG sit on your skin for 1-2 minutes or as long as told. Rinse your entire body in the shower, including all body creases and folds. Turn off the shower. Dry off with a clean towel. Do not put anything on your skin afterward, such as powder, lotion, or perfume. Put on clean clothes or pajamas. If it's the night before surgery, sleep in clean  sheets. General tips Use CHG only as told, and follow the instructions on the label. Use the full amount of CHG as told. This is often one bottle. Do not smoke and stay away from flames after using CHG. Your skin may feel sticky after using CHG. This is normal. The sticky feeling will go away as the CHG dries. Do not use CHG: If you have a chlorhexidine  allergy or have reacted to chlorhexidine  in the past. On open wounds or areas of skin that have broken skin, cuts, or scrapes. On babies younger than 56 months of age. Contact a health care provider if: You have questions about using CHG. Your skin gets irritated or itchy. You have a rash after using CHG. You swallow any CHG. Call your local poison control center [phone] in the [place] Your eyes itch badly, or they become very red or swollen. Your hearing changes. You have trouble seeing. If you can't reach your provider, go to an urgent care or emergency room. Do not drive yourself. Get help right away if: You have swelling or tingling in your mouth or throat. You make high-pitched whistling sounds when you breathe, most often when you breathe out (wheeze). You have trouble breathing. These symptoms may be an emergency. Call 911 right away. Do not wait to see if the symptoms will go away. Do not drive yourself to the hospital. This information is not intended to replace advice given to you by your health care provider. Make sure you discuss any questions you have with your health care provider. Document Revised: 08/11/2022 Document Reviewed: 08/07/2021 Elsevier Patient Education  2024 [employer]. "

## 2024-02-29 ENCOUNTER — Encounter (HOSPITAL_COMMUNITY): Payer: Self-pay

## 2024-02-29 ENCOUNTER — Encounter (HOSPITAL_COMMUNITY)
Admission: RE | Admit: 2024-02-29 | Discharge: 2024-02-29 | Disposition: A | Discharge status: Home / Self Care | Source: Ambulatory Visit | Attending: Urology | Admitting: Urology

## 2024-02-29 DIAGNOSIS — I25118 Atherosclerotic heart disease of native coronary artery with other forms of angina pectoris: Secondary | ICD-10-CM

## 2024-02-29 DIAGNOSIS — Z01810 Encounter for preprocedural cardiovascular examination: Secondary | ICD-10-CM

## 2024-02-29 DIAGNOSIS — Z01818 Encounter for other preprocedural examination: Secondary | ICD-10-CM | POA: Insufficient documentation

## 2024-02-29 LAB — CBC WITH DIFFERENTIAL/PLATELET
Abs Immature Granulocytes: 0.01 K/uL (ref 0.00–0.07)
Basophils Absolute: 0 K/uL (ref 0.0–0.1)
Basophils Relative: 0 %
Eosinophils Absolute: 0.1 K/uL (ref 0.0–0.5)
Eosinophils Relative: 2 %
HCT: 44.1 % (ref 36.0–46.0)
Hemoglobin: 14.3 g/dL (ref 12.0–15.0)
Immature Granulocytes: 0 %
Lymphocytes Relative: 27 %
Lymphs Abs: 2.2 K/uL (ref 0.7–4.0)
MCH: 31.9 pg (ref 26.0–34.0)
MCHC: 32.4 g/dL (ref 30.0–36.0)
MCV: 98.4 fL (ref 80.0–100.0)
Monocytes Absolute: 0.4 K/uL (ref 0.1–1.0)
Monocytes Relative: 4 %
Neutro Abs: 5.7 K/uL (ref 1.7–7.7)
Neutrophils Relative %: 67 %
Platelets: 203 K/uL (ref 150–400)
RBC: 4.48 MIL/uL (ref 3.87–5.11)
RDW: 12.1 % (ref 11.5–15.5)
WBC: 8.4 K/uL (ref 4.0–10.5)
nRBC: 0 % (ref 0.0–0.2)

## 2024-02-29 LAB — BASIC METABOLIC PANEL WITH GFR
Anion gap: 12 (ref 5–15)
BUN: 11 mg/dL (ref 8–23)
CO2: 25 mmol/L (ref 22–32)
Calcium: 9.8 mg/dL (ref 8.9–10.3)
Chloride: 103 mmol/L (ref 98–111)
Creatinine, Ser: 1.11 mg/dL — ABNORMAL HIGH (ref 0.44–1.00)
GFR, Estimated: 52 mL/min — ABNORMAL LOW
Glucose, Bld: 84 mg/dL (ref 70–99)
Potassium: 4.3 mmol/L (ref 3.5–5.1)
Sodium: 141 mmol/L (ref 135–145)

## 2024-03-02 ENCOUNTER — Ambulatory Visit (HOSPITAL_COMMUNITY): Payer: Self-pay | Admitting: Anesthesiology

## 2024-03-02 ENCOUNTER — Encounter (HOSPITAL_COMMUNITY): Payer: Self-pay | Admitting: Urology

## 2024-03-02 ENCOUNTER — Encounter (HOSPITAL_COMMUNITY)
Admission: RE | Disposition: A | Discharge status: Home / Self Care | Payer: Self-pay | Source: Home / Self Care | Attending: Urology

## 2024-03-02 ENCOUNTER — Ambulatory Visit (HOSPITAL_COMMUNITY)
Admission: RE | Admit: 2024-03-02 | Discharge: 2024-03-02 | Disposition: A | Discharge status: Home / Self Care | Attending: Urology | Admitting: Urology

## 2024-03-02 ENCOUNTER — Encounter (HOSPITAL_COMMUNITY): Payer: Self-pay | Admitting: Anesthesiology

## 2024-03-02 DIAGNOSIS — F17210 Nicotine dependence, cigarettes, uncomplicated: Secondary | ICD-10-CM | POA: Diagnosis not present

## 2024-03-02 DIAGNOSIS — J449 Chronic obstructive pulmonary disease, unspecified: Secondary | ICD-10-CM | POA: Insufficient documentation

## 2024-03-02 DIAGNOSIS — N289 Disorder of kidney and ureter, unspecified: Secondary | ICD-10-CM | POA: Insufficient documentation

## 2024-03-02 DIAGNOSIS — G709 Myoneural disorder, unspecified: Secondary | ICD-10-CM | POA: Diagnosis not present

## 2024-03-02 DIAGNOSIS — M1990 Unspecified osteoarthritis, unspecified site: Secondary | ICD-10-CM | POA: Diagnosis not present

## 2024-03-02 DIAGNOSIS — I2510 Atherosclerotic heart disease of native coronary artery without angina pectoris: Secondary | ICD-10-CM | POA: Diagnosis not present

## 2024-03-02 DIAGNOSIS — D494 Neoplasm of unspecified behavior of bladder: Secondary | ICD-10-CM | POA: Diagnosis present

## 2024-03-02 DIAGNOSIS — D303 Benign neoplasm of bladder: Secondary | ICD-10-CM | POA: Diagnosis not present

## 2024-03-02 MED ORDER — LIDOCAINE 2% (20 MG/ML) 5 ML SYRINGE
INTRAMUSCULAR | Status: DC | PRN
  Administered 2024-03-02: 40 mg via INTRAVENOUS

## 2024-03-02 MED ORDER — IPRATROPIUM-ALBUTEROL 0.5-2.5 (3) MG/3ML IN SOLN
3.0000 mL | Freq: Once | RESPIRATORY_TRACT | Status: AC
  Administered 2024-03-02: 3 mL via RESPIRATORY_TRACT

## 2024-03-02 MED ORDER — HYOSCYAMINE SULFATE SL 0.125 MG SL SUBL: 1.0000 | SUBLINGUAL_TABLET | Freq: Four times a day (QID) | SUBLINGUAL | 1 refills | Status: AC | PRN

## 2024-03-02 MED ORDER — PROPOFOL 10 MG/ML IV BOLUS
INTRAVENOUS | Status: AC
  Filled 2024-03-02: qty 20

## 2024-03-02 MED ORDER — ONDANSETRON HCL 4 MG/2ML IJ SOLN
INTRAMUSCULAR | Status: AC
  Filled 2024-03-02: qty 2

## 2024-03-02 MED ORDER — CHLORHEXIDINE GLUCONATE 0.12 % MT SOLN
OROMUCOSAL | Status: AC
  Filled 2024-03-02: qty 15

## 2024-03-02 MED ORDER — ONDANSETRON HCL 4 MG/2ML IJ SOLN
INTRAMUSCULAR | Status: DC | PRN
  Administered 2024-03-02: 4 mg via INTRAVENOUS

## 2024-03-02 MED ORDER — OXYCODONE HCL 5 MG/5ML PO SOLN: 5.0000 mg | Freq: Once | ORAL | Status: DC | PRN

## 2024-03-02 MED ORDER — IPRATROPIUM-ALBUTEROL 0.5-2.5 (3) MG/3ML IN SOLN: 3.0000 mL | Freq: Once | RESPIRATORY_TRACT | Status: DC

## 2024-03-02 MED ORDER — FENTANYL CITRATE (PF) 100 MCG/2ML IJ SOLN
INTRAMUSCULAR | Status: AC
  Filled 2024-03-02: qty 2

## 2024-03-02 MED ORDER — LACTATED RINGERS IV SOLN: INTRAVENOUS | Status: DC

## 2024-03-02 MED ORDER — DEXAMETHASONE SOD PHOSPHATE PF 10 MG/ML IJ SOLN
INTRAMUSCULAR | Status: AC
  Filled 2024-03-02: qty 1

## 2024-03-02 MED ORDER — DEXAMETHASONE SODIUM PHOSPHATE 4 MG/ML IJ SOLN
INTRAMUSCULAR | Status: AC
  Filled 2024-03-02: qty 2

## 2024-03-02 MED ORDER — ORAL CARE MOUTH RINSE: 15.0000 mL | Freq: Once | OROMUCOSAL | Status: AC

## 2024-03-02 MED ORDER — FENTANYL CITRATE (PF) 100 MCG/2ML IJ SOLN
INTRAMUSCULAR | Status: DC | PRN
  Administered 2024-03-02 (×2): 25 ug via INTRAVENOUS

## 2024-03-02 MED ORDER — OXYCODONE HCL 5 MG PO TABS: 5.0000 mg | ORAL_TABLET | ORAL | 0 refills | Status: AC | PRN

## 2024-03-02 MED ORDER — LIDOCAINE 2% (20 MG/ML) 5 ML SYRINGE
INTRAMUSCULAR | Status: AC
  Filled 2024-03-02: qty 10

## 2024-03-02 MED ORDER — HYDROMORPHONE HCL 1 MG/ML IJ SOLN
INTRAMUSCULAR | Status: AC
  Filled 2024-03-02: qty 0.5

## 2024-03-02 MED ORDER — ALBUTEROL SULFATE HFA 108 (90 BASE) MCG/ACT IN AERS
INHALATION_SPRAY | RESPIRATORY_TRACT | Status: AC
  Filled 2024-03-02: qty 6.7

## 2024-03-02 MED ORDER — OXYCODONE HCL 5 MG PO TABS: 5.0000 mg | ORAL_TABLET | Freq: Once | ORAL | Status: DC | PRN

## 2024-03-02 MED ORDER — CHLORHEXIDINE GLUCONATE 0.12 % MT SOLN
15.0000 mL | Freq: Once | OROMUCOSAL | Status: AC
  Administered 2024-03-02: 15 mL via OROMUCOSAL

## 2024-03-02 MED ORDER — PHENYLEPHRINE 80 MCG/ML (10ML) SYRINGE FOR IV PUSH (FOR BLOOD PRESSURE SUPPORT)
PREFILLED_SYRINGE | INTRAVENOUS | Status: DC | PRN
  Administered 2024-03-02 (×3): 80 ug via INTRAVENOUS
  Administered 2024-03-02: 160 ug via INTRAVENOUS

## 2024-03-02 MED ORDER — HYDROMORPHONE HCL 1 MG/ML IJ SOLN
0.2500 mg | INTRAMUSCULAR | Status: DC | PRN
  Administered 2024-03-02: 0.5 mg via INTRAVENOUS

## 2024-03-02 MED ORDER — DEXAMETHASONE SOD PHOSPHATE PF 10 MG/ML IJ SOLN
8.0000 mg | Freq: Once | INTRAMUSCULAR | Status: AC
  Administered 2024-03-02: 8 mg via INTRAVENOUS

## 2024-03-02 MED ORDER — ROCURONIUM BROMIDE 10 MG/ML (PF) SYRINGE
PREFILLED_SYRINGE | INTRAVENOUS | Status: DC | PRN
  Administered 2024-03-02: 40 mg via INTRAVENOUS

## 2024-03-02 MED ORDER — STERILE WATER FOR IRRIGATION IR SOLN
Status: DC | PRN
  Administered 2024-03-02: 1000 mL

## 2024-03-02 MED ORDER — SODIUM CHLORIDE 0.9 % IR SOLN
Status: DC | PRN
  Administered 2024-03-02: 3000 mL

## 2024-03-02 MED ORDER — GEMCITABINE CHEMO FOR BLADDER INSTILLATION 2000 MG
INTRAVENOUS | Status: DC | PRN
  Administered 2024-03-02: 2000 mg via INTRAVESICAL

## 2024-03-02 MED ORDER — SUGAMMADEX SODIUM 200 MG/2ML IV SOLN
INTRAVENOUS | Status: DC | PRN
  Administered 2024-03-02: 200 mg via INTRAVENOUS

## 2024-03-02 MED ORDER — IPRATROPIUM-ALBUTEROL 0.5-2.5 (3) MG/3ML IN SOLN
RESPIRATORY_TRACT | Status: AC
  Filled 2024-03-02: qty 3

## 2024-03-02 MED ORDER — PROPOFOL 10 MG/ML IV BOLUS
INTRAVENOUS | Status: DC | PRN
  Administered 2024-03-02: 60 mg via INTRAVENOUS
  Administered 2024-03-02: 10 mg via INTRAVENOUS

## 2024-03-02 MED ORDER — GEMCITABINE CHEMO FOR BLADDER INSTILLATION 2000 MG
2000.0000 mg | Freq: Once | INTRAVENOUS | Status: DC
  Filled 2024-03-02: qty 52.6

## 2024-03-02 MED ORDER — CEFAZOLIN SODIUM-DEXTROSE 2-4 GM/100ML-% IV SOLN
2.0000 g | INTRAVENOUS | Status: AC
  Administered 2024-03-02: 2 g via INTRAVENOUS
  Filled 2024-03-02: qty 100

## 2024-03-02 NOTE — Anesthesia Postprocedure Evaluation (Signed)
"   Anesthesia Post Note  Patient: Karen Church  Procedure(s) Performed: TURBT, WITH CHEMOTHERAPEUTIC AGENT INSTILLATION INTO BLADDER (Bladder)  Anesthesia Type: General Anesthetic complications: no   No notable events documented.   Last Vitals:  Vitals:   03/02/24 0845 03/02/24 0853  BP: (!) 144/66   Pulse: 88 92  Resp: 17 (!) 23  Temp:    SpO2: 100% 93%    Last Pain:  Vitals:   03/02/24 0853  TempSrc:   PainSc: 7                  Andrea Limes      "

## 2024-03-02 NOTE — Transfer of Care (Signed)
 Immediate Anesthesia Transfer of Care Note  Patient: Karen Church  Procedure(s) Performed: TURBT, WITH CHEMOTHERAPEUTIC AGENT INSTILLATION INTO BLADDER (Bladder)  Patient Location: PACU  Anesthesia Type:General  Level of Consciousness: awake, alert , oriented, and patient cooperative  Airway & Oxygen  Therapy: Patient Spontanous Breathing and Patient connected to face mask oxygen   Post-op Assessment: Report given to RN, Post -op Vital signs reviewed and stable, and Post -op Vital signs reviewed and unstable, Anesthesiologist notified  Post vital signs: Reviewed and stable  Last Vitals:  Vitals Value Taken Time  BP 144/66 03/02/24 08:41  Temp 98.2 0842  Pulse 91 03/02/24 08:42  Resp 23 03/02/24 08:42  SpO2 100 % 03/02/24 08:42  Vitals shown include unfiled device data.  Last Pain:  Vitals:   03/02/24 0713  TempSrc: Axillary  PainSc: 0-No pain      Patients Stated Pain Goal: 7 (03/02/24 0713)  Complications: No notable events documented.

## 2024-03-02 NOTE — Anesthesia Procedure Notes (Signed)
 Procedure Name: Intubation Date/Time: 03/02/2024 8:07 AM  Performed by: Con Fitch, RNPre-anesthesia Checklist: Patient identified, Emergency Drugs available, Suction available and Patient being monitored Patient Re-evaluated:Patient Re-evaluated prior to induction Oxygen  Delivery Method: Circle system utilized Preoxygenation: Pre-oxygenation with 100% oxygen  Induction Type: IV induction Ventilation: Mask ventilation without difficulty Laryngoscope Size: Mac and 3 Grade View: Grade I Tube type: Oral Tube size: 6.5 mm Number of attempts: 1 Airway Equipment and Method: Stylet and Oral airway Placement Confirmation: ETT inserted through vocal cords under direct vision, positive ETCO2 and breath sounds checked- equal and bilateral Secured at: 22 cm Tube secured with: Tape Dental Injury: Teeth and Oropharynx as per pre-operative assessment

## 2024-03-02 NOTE — Op Note (Signed)
.  Preoperative diagnosis: bladder tumor  Postoperative diagnosis: Same  Procedure: 1 cystoscopy 2.Transurethral resection of bladder tumor, small 3. Instillation of bladder chemotherapy agent  Attending: Belvie Clara  Anesthesia: General  Estimated blood loss: Minimal  Drains: 22 French foley  Specimens: small  Antibiotics: ancef   Findings: 2cm papillary posterior wall tumor.  Right ureteral orifice surgically absent. Left ureteral orifice in normal anatomic location  Indications: Patient is a 74 year old female/female with a history of bladder tumor found on office cystoscopy.  After discussing treatment options, they decided proceed with transurethral resection of a bladder tumor.  Procedure in detail: The patient was brought to the operating room and a brief timeout was done to ensure correct patient, correct procedure, correct site.  General anesthesia was administered patient was placed in dorsal lithotomy position.  Their genitalia was then prepped and draped in usual sterile fashion.  A rigid 22 French cystoscope was passed in the urethra and the bladder.  Bladder was inspected and we noted a 2cm bladder tumor. Right ureteral orifice surgically absent. Left ureteral orifice in normal anatomic location french ureteral catheter was then instilled into the left ureteral orifice.  We then removed the cystoscope and placed a resectoscope into the bladder.  Using the bipolar resectoscope we removed the bladder tumor down to the base. Hemostasis was then obtained with electrocautery. We then removed the bladder tumor chips and sent them for pathology. We then re-inspected the bladder and found no residual bleeding.  the bladder was then drained, a 22 French foley was placed, 2g of gemcitabine  was instilled into the bladder, and this concluded the procedure which was well tolerated by patient.  Complications: None  Condition: Stable, extubated, transferred to PACU  Plan: Patient is to  have her foley drained in 1 hour. She will be discharged home and followup in 6 days for foley catheter removal and pathology discussion.

## 2024-03-02 NOTE — H&P (Signed)
 Urology Admission H&P  Chief Complaint: bladder tumors  History of Present Illness: Ms Belding is a 74yo here for bladder tumor resection for recurrent bladder cancer. She denies nay hematuria. 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   Dyspnea    Hydronephrosis of right kidney    Melanoma (HCC)    R arm   Pneumonia    PONV (postoperative nausea and vomiting)    Past Surgical History:  Procedure Laterality Date   ABDOMINAL HYSTERECTOMY     BALLOON DILATION N/A 08/11/2021   Procedure: BALLOON DILATION;  Surgeon: Cindie Carlin POUR, DO;  Location: [facility];  Service: Endoscopy;  Laterality: N/A;   BIOPSY  12/10/2015   Procedure: BIOPSY;  Surgeon: Lamar CHRISTELLA Hollingshead, MD;  Location: [facility];  Service: Endoscopy;;  Gastric    BIOPSY  08/11/2021   Procedure: BIOPSY;  Surgeon: Cindie Carlin POUR, DO;  Location: [facility];  Service: Endoscopy;;  gastric    CESAREAN SECTION     COLONOSCOPY N/A 12/10/2015   normal   CYSTOSCOPY N/A 05/17/2023   Procedure: CYSTOSCOPY;  Surgeon: Sherrilee Belvie CROME, MD;  Location: [facility];  Service: Urology;  Laterality: N/A;   CYSTOSCOPY WITH RETROGRADE PYELOGRAM, URETEROSCOPY AND STENT PLACEMENT Bilateral 03/05/2023   Procedure: CYSTOSCOPY WITH BILATERAL RETROGRADE PYELOGRAM, RIGHT URETEROSCOPY WITH BIOPSY AND STENT PLACEMENT;  Surgeon: Nieves Cough, MD;  Location: [facility];  Service: Urology;  Laterality: Bilateral;   ESOPHAGOGASTRODUODENOSCOPY N/A 12/10/2015   normal esophagus s/p dilation, +H.pylori   ESOPHAGOGASTRODUODENOSCOPY (EGD) WITH PROPOFOL  N/A 08/11/2021   Procedure: ESOPHAGOGASTRODUODENOSCOPY (EGD) WITH  PROPOFOL ;  Surgeon: Cindie Carlin POUR, DO;  Location: [facility];  Service: Endoscopy;  Laterality: N/A;  2:15pm   MALONEY DILATION N/A 12/10/2015   Procedure: AGAPITO DILATION;  Surgeon: Lamar CHRISTELLA Hollingshead, MD;  Location: [facility];  Service: Endoscopy;  Laterality: N/A;   ROBOT ASSITED LAPAROSCOPIC NEPHROURETERECTOMY Right 05/17/2023   Procedure: NEPHROURETERECTOMY, ROBOT-ASSISTED, LAPAROSCOPIC;  Surgeon: Sherrilee Belvie CROME, MD;  Location: [facility];  Service: Urology;  Laterality: Right;   SKIN CANCER EXCISION     SMALL INTESTINE SURGERY N/A 1967   Resection   TRANSURETHRAL RESECTION OF BLADDER TUMOR N/A 05/17/2023   Procedure: TURBT (TRANSURETHRAL RESECTION OF BLADDER TUMOR);  Surgeon: Sherrilee Belvie CROME, MD;  Location: [facility];  Service: Urology;  Laterality: N/A;    Home Medications:  Current Facility-Administered Medications  Medication Dose Route Frequency Provider Last Rate Last Admin   ceFAZolin  (ANCEF ) IVPB 2g/100 mL premix  2 g Intravenous 30 min Pre-Op Murrell Elizondo, Belvie CROME, MD       chlorhexidine  (PERIDEX ) 0.12 % solution            gemcitabine  (GEMZAR ) chemo syringe for bladder instillation 2,000 mg  2,000 mg Bladder Instillation Once Raza Bayless, Belvie CROME, MD       lactated ringers  infusion   Intravenous Continuous Herschell Hollering, MD 10 mL/hr at 03/02/24 0723 New Bag at 03/02/24 9276   Allergies: Allergies[1]  Family History  Problem Relation Age of Onset   Colon polyps Sister    Colon polyps Brother  Colon cancer Neg Hx    Social History:  reports that she has been smoking cigarettes. She has a 25 pack-year smoking history. She has been exposed to tobacco smoke. She has never used smokeless tobacco. She reports that she does not drink alcohol and does not use drugs.  Review of Systems  All other systems reviewed and are negative.   Physical Exam:  Vital signs in last 24 hours: Temp:  [97.2 F (36.2 C)] 97.2 F (36.2 C) (01/22 0713) Pulse Rate:  [90] 90 (01/22  0713) Resp:  [15] 15 (01/22 0713) BP: (152)/(69) 152/69 (01/22 0713) SpO2:  [89 %-100 %] 92 % (01/22 0725) Weight:  [47.5 kg] 47.5 kg (01/22 9286) Physical Exam Nursing note reviewed.  Constitutional:      Appearance: Normal appearance.  HENT:     Head: Normocephalic and atraumatic.     Mouth/Throat:     Mouth: Mucous membranes are dry.  Eyes:     Extraocular Movements: Extraocular movements intact.     Pupils: Pupils are equal, round, and reactive to light.  Cardiovascular:     Rate and Rhythm: Normal rate and regular rhythm.  Pulmonary:     Effort: Pulmonary effort is normal. No respiratory distress.  Abdominal:     General: Abdomen is flat. There is no distension.  Musculoskeletal:        General: No swelling. Normal range of motion.     Cervical back: Normal range of motion and neck supple.  Skin:    General: Skin is warm and dry.  Neurological:     General: No focal deficit present.     Mental Status: She is alert and oriented to person, place, and time.  Psychiatric:        Mood and Affect: Mood normal.        Behavior: Behavior normal.        Thought Content: Thought content normal.        Judgment: Judgment normal.     Laboratory Data:  No results found for this or any previous visit (from the past 24 hours). No results found for this or any previous visit (from the past 240 hours). Creatinine: Recent Labs    02/29/24 1522  CREATININE 1.11*   Baseline Creatinine: 1.1  Impression/Assessment:  74yo with recurrent bladder tumors  Plan:  The risks/benefits/alternatives to transurethral resection of a bladder tumor was explained to the patient and she understands and wishes to proceed with surgery  Belvie Clara 03/02/2024, 7:32 AM          [1]  Allergies Allergen Reactions   Lasix [Furosemide] Swelling

## 2024-03-02 NOTE — Discharge Instructions (Signed)

## 2024-03-02 NOTE — Anesthesia Preprocedure Evaluation (Addendum)
"                                    Anesthesia Evaluation  Patient identified by MRN, date of birth, ID band Patient awake    Reviewed: Allergy & Precautions, H&P , NPO status , Patient's Chart, lab work & pertinent test results  History of Anesthesia Complications (+) PONV and history of anesthetic complications  Airway Mallampati: II  TM Distance: >3 FB Neck ROM: Full    Dental  (+) Missing, Poor Dentition   Pulmonary shortness of breath, pneumonia, COPD, Current Smoker and Patient abstained from smoking. 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  Spo2 90% Neb treatment and steroids ordered    + decreased breath sounds+ wheezing unstable     Cardiovascular + CAD  Normal cardiovascular exam Rhythm:Regular Rate:Normal  Nl EF   Neuro/Psych  Neuromuscular disease  negative psych ROS   GI/Hepatic negative GI ROS, Neg liver ROS,,,  Endo/Other  negative endocrine ROS    Renal/GU Renal disease  negative genitourinary   Musculoskeletal  (+) Arthritis ,    Abdominal   Peds negative pediatric ROS (+)  Hematology negative hematology ROS (+)   Anesthesia Other Findings   Reproductive/Obstetrics negative OB ROS                              Anesthesia Physical Anesthesia Plan  ASA: 4  Anesthesia Plan: General   Post-op Pain Management:    Induction:   PONV Risk Score and Plan:   Airway Management Planned:   Additional Equipment:   Intra-op Plan:   Post-operative Plan: Extubation in OR and Possible Post-op intubation/ventilation  Informed Consent: I have reviewed the patients History and Physical, chart, labs and discussed the procedure including the risks, benefits and alternatives for the proposed anesthesia with the patient or authorized representative who has indicated his/her understanding and acceptance.     Dental advisory given  Plan Discussed with: CRNA and Surgeon  Anesthesia Plan Comments: (Will re  asses patient after steroids and nebulizer treatment given The patient understands that she may need some postoperative ventilation)         Anesthesia Quick Evaluation  "

## 2024-03-02 NOTE — Progress Notes (Addendum)
 Bladder instilled at 0827 in the OR,Bladder drained at 0940, post gencitabine instillation.  75cc drained and properly disposed of.  Patient tolerated well.  Foley left in place for patient to be d/c home with.

## 2024-03-03 LAB — SURGICAL PATHOLOGY

## 2024-03-08 ENCOUNTER — Ambulatory Visit: Admitting: Urology

## 2024-03-08 VITALS — BP 128/66 | HR 94

## 2024-03-08 DIAGNOSIS — C661 Malignant neoplasm of right ureter: Secondary | ICD-10-CM

## 2024-03-08 MED ORDER — CIPROFLOXACIN HCL 500 MG PO TABS
500.0000 mg | ORAL_TABLET | Freq: Once | ORAL | Status: AC
  Administered 2024-03-08: 500 mg via ORAL

## 2024-03-08 NOTE — Progress Notes (Unsigned)
 Fill and Pull Catheter Removal  Patient is present today for a catheter removal due to post TURBT.  250 ml of sterile water  was instilled into the bladder when the patient felt the urge to urinate. 25 ml of water  was then drained from the balloon.  A 18FR foley cath was removed from the bladder no complications were noted .  Foley catheter intact and time of removal. Patient as then given some time to void on their own.  Patient can void  400 ml on their own after some time.  Patient tolerated well.  One oral prophylactic antibiotic given per MD orders  Performed by: Exie T. CMA  Follow up/ Additional notes: as scheduled

## 2024-03-08 NOTE — Progress Notes (Unsigned)
 "  03/08/2024 10:24 AM   Karen Church 10/30/1950 [id number]  Referring provider: Shona Norleen PEDLAR, MD [address] Karen Church,  [place] [address]  No chief complaint on file.   HPI: Ms Liebig is a 74yo here for followup after bladder tumor resection. Pathology benign.   PMH: Past Medical History:  Diagnosis Date   Arthritis    Carpal tunnel syndrome    COPD, severe (HCC)    pulmolonogy--- dr Karen Church;  GOLD III; pt still smokes;  no oxygen  at home;    last exacerbation w/ hypoxia @ [facility] 11-01-2022 O2 83-87% refused to go to ED, given steroid/ anticiotic/ nebs/ f/u 1-2 w/ pulm12-13-2021 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   Dyspnea    Hydronephrosis of right kidney    Melanoma (HCC)    R arm   Pneumonia    PONV (postoperative nausea and vomiting)     Surgical History: Past Surgical History:  Procedure Laterality Date   ABDOMINAL HYSTERECTOMY     BALLOON Church N/A 08/11/2021   Procedure: BALLOON Church;  Surgeon: Karen Carlin POUR, DO;  [facility]: [facility];  Service: Endoscopy;  Laterality: N/A;   BIOPSY  12/10/2015   Procedure: BIOPSY;  Surgeon: Karen CHRISTELLA Hollingshead, MD;  Location: [facility];  Service: Endoscopy;;  Gastric    BIOPSY  08/11/2021   Procedure: BIOPSY;  Surgeon: Karen Carlin POUR, DO;  [facility]: [facility];  Service: Endoscopy;;  gastric    CESAREAN SECTION     COLONOSCOPY N/A 12/10/2015   normal   CYSTOSCOPY N/A 05/17/2023   Procedure: CYSTOSCOPY;  Surgeon: Karen Belvie CROME, MD;  Location: [facility];  Service: Urology;  Laterality: N/A;   CYSTOSCOPY WITH RETROGRADE PYELOGRAM, URETEROSCOPY AND STENT PLACEMENT Bilateral 03/05/2023   Procedure: CYSTOSCOPY WITH BILATERAL RETROGRADE PYELOGRAM, RIGHT URETEROSCOPY WITH BIOPSY AND STENT PLACEMENT;  Surgeon: Karen Cough, MD;  Location: [facility];  Service: Urology;  Laterality: Bilateral;   ESOPHAGOGASTRODUODENOSCOPY N/A 12/10/2015   normal esophagus s/p Church, +H.pylori    ESOPHAGOGASTRODUODENOSCOPY (EGD) WITH PROPOFOL  N/A 08/11/2021   Procedure: ESOPHAGOGASTRODUODENOSCOPY (EGD) WITH PROPOFOL ;  Surgeon: Karen Carlin POUR, DO;  [facility];  Service: Endoscopy;  Laterality: N/A;  2:15pm   MALONEY Church N/A 12/10/2015   Procedure: Karen Church;  Surgeon: Karen CHRISTELLA Hollingshead, MD;  [facility];  Service: Endoscopy;  Laterality: N/A;   ROBOT ASSITED LAPAROSCOPIC NEPHROURETERECTOMY Right 05/17/2023   Procedure: NEPHROURETERECTOMY, ROBOT-ASSISTED, LAPAROSCOPIC;  Surgeon: Karen Belvie CROME, MD;  Location: [facility];  Service: Urology;  Laterality: Right;   SKIN CANCER EXCISION     SMALL INTESTINE SURGERY N/A 1967   Resection   TRANSURETHRAL RESECTION OF BLADDER TUMOR N/A 05/17/2023   Procedure: TURBT (TRANSURETHRAL RESECTION OF BLADDER TUMOR);  Surgeon: Karen Belvie CROME, MD;  Location: [facility];  Service: Urology;  Laterality: N/A;    Home Medications:  Allergies as of 03/08/2024       Reactions   Lasix [furosemide] Swelling        Medication List        Accurate as of March 08, 2024 10:24 AM. If you have any questions, ask your nurse or doctor.          AeroChamber Pls FloVu Mthpiece Devi See admin instructions.   albuterol  (2.5 MG/3ML) 0.083% nebulizer solution Commonly known as: PROVENTIL  Take 2.5 mg by nebulization every 6 (six) hours as needed for wheezing or shortness of breath.   albuterol  108 (90  Base) MCG/ACT inhaler Commonly known as: VENTOLIN  HFA Inhale 2 puffs into the lungs every 4 (four) hours as needed for wheezing or shortness of breath.   aspirin  EC 81 MG tablet Take 81 mg by mouth daily.   Breztri  Aerosphere 160-9-4.8 MCG/ACT Aero inhaler Generic drug: budesonide-glycopyrrolate -formoterol  INHALE 2 PUFFS INTO THE LUNGS TWICE DAILY. TAKE 2 PUFFS FIRST THING IN AM AND THEN ANOTHER 2 PUFFS ABOUT 12 HOURS LATER What changed: See the new instructions.   cholecalciferol 25 MCG (1000 UNIT) tablet Commonly known  as: VITAMIN D3 Take 1,000 Units by mouth daily.   FISH OIL PO Take 1 capsule by mouth daily.   Hyoscyamine  Sulfate SL 0.125 MG Subl Commonly known as: Levsin /SL Place 1 tablet (0.125 mg total) under the tongue every 6 (six) hours as needed.   multivitamin with minerals Tabs tablet Take 1 tablet by mouth daily.   nitroGLYCERIN  0.4 MG SL tablet Commonly known as: Nitrostat  Place 1 tablet (0.4 mg total) under the tongue every 5 (five) minutes x 3 doses as needed for chest pain (If no relief after 3rd dose proceed to ED or call 911).   oxyCODONE  5 MG immediate release tablet Commonly known as: Oxy IR/ROXICODONE  Take 1 tablet (5 mg total) by mouth every 4 (four) hours as needed for moderate pain (pain score 4-6).   rosuvastatin  10 MG tablet Commonly known as: CRESTOR  Take 1 tablet (10 mg total) by mouth daily. What changed: when to take this        Allergies: Allergies[1]  Family History: Family History  Problem Relation Age of Onset   Colon polyps Sister    Colon polyps Brother    Colon cancer Neg Hx     Social History:  reports that she has been smoking cigarettes. She has a 25 pack-year smoking history. She has been exposed to tobacco smoke. She has never used smokeless tobacco. She reports that she does not drink alcohol and does not use drugs.  ROS: All other review of systems were reviewed and are negative except what is noted above in HPI  Physical Exam: BP 128/66   Pulse 94   Constitutional:  Alert and oriented, No acute distress. HEENT: [place] AT, moist mucus membranes.  Trachea midline, no masses. Cardiovascular: No clubbing, cyanosis, or edema. Respiratory: Normal respiratory effort, no increased work of breathing. GI: Abdomen is soft, nontender, nondistended, no abdominal masses GU: No CVA tenderness.  Lymph: No cervical or inguinal lymphadenopathy. Skin: No rashes, bruises or suspicious lesions. Neurologic: Grossly intact, no focal deficits, moving all 4  extremities. Psychiatric: Normal mood and affect.  Laboratory Data: Lab Results  Component Value Date   WBC 8.4 02/29/2024   HGB 14.3 02/29/2024   HCT 44.1 02/29/2024   MCV 98.4 02/29/2024   PLT 203 02/29/2024    Lab Results  Component Value Date   CREATININE 1.11 (H) 02/29/2024    No results found for: PSA  No results found for: TESTOSTERONE  No results found for: HGBA1C  Urinalysis    Component Value Date/Time   APPEARANCEUR Clear 01/05/2024 1105   GLUCOSEU Negative 01/05/2024 1105   BILIRUBINUR Negative 01/05/2024 1105   PROTEINUR Negative 01/05/2024 1105   UROBILINOGEN 0.2 04/02/2023 1507   NITRITE Negative 01/05/2024 1105   LEUKOCYTESUR 1+ (A) 01/05/2024 1105    Lab Results  Component Value Date   LABMICR See below: 01/05/2024   WBCUA 6-10 (A) 01/05/2024   LABEPIT 0-10 01/05/2024   BACTERIA None seen 01/05/2024  Pertinent Imaging: *** No results found for this or any previous visit.  No results found for this or any previous visit.  No results found for this or any previous visit.  No results found for this or any previous visit.  No results found for this or any previous visit.  No results found for this or any previous visit.  No results found for this or any previous visit.  No results found for this or any previous visit.   Assessment & Plan:    1. Ureteral cancer, right (HCC) (Primary) Followup 3 months for cystoscopy - Bladder Voiding Trial - ciprofloxacin  (CIPRO ) tablet 500 mg   No follow-ups on file.  Belvie Clara, MD  [facility] [place]      [1]  Allergies Allergen Reactions   Lasix [Furosemide] Swelling   "

## 2024-03-09 ENCOUNTER — Encounter: Payer: Self-pay | Admitting: Urology

## 2024-03-09 NOTE — Patient Instructions (Signed)

## 2024-06-07 ENCOUNTER — Other Ambulatory Visit: Admitting: Urology
